# Patient Record
Sex: Male | Born: 1945 | Race: White | Hispanic: No | Smoking: Former smoker
Health system: Southern US, Community
[De-identification: ages and names within clinical notes are randomized; demographics above are authoritative.]

## PROBLEM LIST (undated history)

## (undated) DIAGNOSIS — I639 Cerebral infarction, unspecified: Secondary | ICD-10-CM

## (undated) DIAGNOSIS — R6251 Failure to thrive (child): Secondary | ICD-10-CM

## (undated) DIAGNOSIS — G35 Multiple sclerosis: Secondary | ICD-10-CM

## (undated) DIAGNOSIS — I2510 Atherosclerotic heart disease of native coronary artery without angina pectoris: Secondary | ICD-10-CM

## (undated) DIAGNOSIS — I255 Ischemic cardiomyopathy: Secondary | ICD-10-CM

## (undated) DIAGNOSIS — I10 Essential (primary) hypertension: Secondary | ICD-10-CM

## (undated) DIAGNOSIS — E785 Hyperlipidemia, unspecified: Secondary | ICD-10-CM

## (undated) DIAGNOSIS — F319 Bipolar disorder, unspecified: Secondary | ICD-10-CM

## (undated) DIAGNOSIS — I5042 Chronic combined systolic (congestive) and diastolic (congestive) heart failure: Secondary | ICD-10-CM

## (undated) HISTORY — DX: Multiple sclerosis: G35

## (undated) HISTORY — DX: Bipolar disorder, unspecified: F31.9

## (undated) HISTORY — DX: Essential (primary) hypertension: I10

## (undated) HISTORY — DX: Hyperlipidemia, unspecified: E78.5

## (undated) HISTORY — PX: PTCA: SHX146

## (undated) HISTORY — DX: Failure to thrive (child): R62.51

## (undated) HISTORY — DX: Ischemic cardiomyopathy: I25.5

## (undated) HISTORY — PX: TONSILLECTOMY: SUR1361

## (undated) HISTORY — PX: VASECTOMY: SHX75

## (undated) HISTORY — DX: Cerebral infarction, unspecified: I63.9

## (undated) HISTORY — DX: Atherosclerotic heart disease of native coronary artery without angina pectoris: I25.10

## (undated) HISTORY — PX: OTHER SURGICAL HISTORY: SHX169

---

## 1999-01-07 ENCOUNTER — Emergency Department (HOSPITAL_COMMUNITY): Admission: EM | Admit: 1999-01-07 | Discharge: 1999-01-07 | Payer: Self-pay | Admitting: Emergency Medicine

## 1999-03-27 ENCOUNTER — Encounter: Payer: Self-pay | Admitting: Family Medicine

## 1999-03-27 ENCOUNTER — Ambulatory Visit (HOSPITAL_COMMUNITY): Admission: RE | Admit: 1999-03-27 | Discharge: 1999-03-27 | Payer: Self-pay | Admitting: Family Medicine

## 1999-04-16 ENCOUNTER — Encounter: Admission: RE | Admit: 1999-04-16 | Discharge: 1999-07-12 | Payer: Self-pay | Admitting: Neurology

## 1999-06-04 ENCOUNTER — Ambulatory Visit (HOSPITAL_COMMUNITY): Admission: RE | Admit: 1999-06-04 | Discharge: 1999-06-04 | Payer: Self-pay | Admitting: Neurology

## 1999-09-17 ENCOUNTER — Ambulatory Visit (HOSPITAL_COMMUNITY): Admission: RE | Admit: 1999-09-17 | Discharge: 1999-09-18 | Payer: Self-pay | Admitting: *Deleted

## 1999-09-23 ENCOUNTER — Encounter: Payer: Self-pay | Admitting: *Deleted

## 1999-09-23 ENCOUNTER — Ambulatory Visit (HOSPITAL_COMMUNITY): Admission: RE | Admit: 1999-09-23 | Discharge: 1999-09-23 | Payer: Self-pay | Admitting: *Deleted

## 2000-01-03 ENCOUNTER — Encounter: Payer: Self-pay | Admitting: Emergency Medicine

## 2000-01-03 ENCOUNTER — Emergency Department (HOSPITAL_COMMUNITY): Admission: EM | Admit: 2000-01-03 | Discharge: 2000-01-03 | Payer: Self-pay | Admitting: Emergency Medicine

## 2004-09-27 ENCOUNTER — Ambulatory Visit: Payer: Self-pay | Admitting: *Deleted

## 2004-12-13 ENCOUNTER — Ambulatory Visit: Payer: Self-pay | Admitting: Internal Medicine

## 2005-02-04 ENCOUNTER — Ambulatory Visit: Payer: Self-pay | Admitting: Internal Medicine

## 2005-03-18 ENCOUNTER — Ambulatory Visit: Payer: Self-pay | Admitting: Internal Medicine

## 2006-05-08 ENCOUNTER — Ambulatory Visit: Payer: Self-pay | Admitting: Internal Medicine

## 2008-01-19 ENCOUNTER — Encounter: Payer: Self-pay | Admitting: Internal Medicine

## 2008-01-19 DIAGNOSIS — Z9861 Coronary angioplasty status: Secondary | ICD-10-CM

## 2008-01-19 DIAGNOSIS — I2510 Atherosclerotic heart disease of native coronary artery without angina pectoris: Secondary | ICD-10-CM

## 2008-01-19 DIAGNOSIS — G35D Multiple sclerosis, unspecified: Secondary | ICD-10-CM | POA: Insufficient documentation

## 2008-01-19 DIAGNOSIS — I10 Essential (primary) hypertension: Secondary | ICD-10-CM

## 2008-01-19 DIAGNOSIS — F319 Bipolar disorder, unspecified: Secondary | ICD-10-CM

## 2008-01-19 DIAGNOSIS — I255 Ischemic cardiomyopathy: Secondary | ICD-10-CM

## 2008-01-19 DIAGNOSIS — E785 Hyperlipidemia, unspecified: Secondary | ICD-10-CM

## 2008-01-19 DIAGNOSIS — F1021 Alcohol dependence, in remission: Secondary | ICD-10-CM

## 2008-01-19 DIAGNOSIS — G35 Multiple sclerosis: Secondary | ICD-10-CM

## 2008-08-13 ENCOUNTER — Emergency Department (HOSPITAL_COMMUNITY): Admission: EM | Admit: 2008-08-13 | Discharge: 2008-08-13 | Payer: Self-pay | Admitting: Emergency Medicine

## 2009-02-26 ENCOUNTER — Encounter: Payer: Self-pay | Admitting: Internal Medicine

## 2010-03-16 ENCOUNTER — Inpatient Hospital Stay (HOSPITAL_COMMUNITY): Admission: EM | Admit: 2010-03-16 | Discharge: 2010-03-20 | Payer: Self-pay | Admitting: Emergency Medicine

## 2010-03-18 ENCOUNTER — Ambulatory Visit: Payer: Self-pay | Admitting: Vascular Surgery

## 2010-03-18 ENCOUNTER — Encounter (INDEPENDENT_AMBULATORY_CARE_PROVIDER_SITE_OTHER): Payer: Self-pay | Admitting: Internal Medicine

## 2010-03-19 ENCOUNTER — Ambulatory Visit: Payer: Self-pay | Admitting: Physical Medicine & Rehabilitation

## 2010-03-20 ENCOUNTER — Inpatient Hospital Stay (HOSPITAL_COMMUNITY)
Admission: RE | Admit: 2010-03-20 | Discharge: 2010-04-05 | Payer: Self-pay | Admitting: Physical Medicine & Rehabilitation

## 2010-04-05 ENCOUNTER — Encounter: Payer: Self-pay | Admitting: Internal Medicine

## 2010-05-01 ENCOUNTER — Telehealth: Payer: Self-pay | Admitting: Internal Medicine

## 2010-08-29 ENCOUNTER — Inpatient Hospital Stay (HOSPITAL_COMMUNITY)
Admission: EM | Admit: 2010-08-29 | Discharge: 2010-09-09 | Disposition: A | Discharge status: Other Acute Inpatient Hospital | Payer: Self-pay | Source: Home / Self Care

## 2010-08-30 ENCOUNTER — Encounter (INDEPENDENT_AMBULATORY_CARE_PROVIDER_SITE_OTHER): Payer: Self-pay

## 2010-09-29 ENCOUNTER — Encounter: Payer: Self-pay | Admitting: Physical Medicine & Rehabilitation

## 2010-10-10 NOTE — Miscellaneous (Signed)
Summary: Discharge/[place]  Discharge/[place]   Imported By: Lester Celada 04/23/2010 08:25:22  _____________________________________________________________________  External Attachment:    Type:   Image     Comment:   External Document

## 2010-10-10 NOTE — Progress Notes (Signed)
       New/Updated Medications: ACETAMINOPHEN 325 MG SUPP (ACETAMINOPHEN) 1 to 2 by mouth q 4 hours as needed     Current Medications (verified): 1)  Lithium Carbonate 300 Mg  Caps (Lithium Carbonate) .... Take 1 Q Am, 2 Q Pm 2)  Aspirin Ec 325 Mg  Tbec (Aspirin) .... Take 1 By Mouth Qd 3)  Lipitor 80 Mg  Tabs (Atorvastatin Calcium) .... Take 1 By Mouth Qd 4)  Spironolactone 25 Mg  Tabs (Spironolactone) .... Take 1 By Mouth Qd 5)  Norvasc 10 Mg  Tabs (Amlodipine Besylate) .... Take 2 By Mouth Qd 6)  Furosemide 20 Mg  Tabs (Furosemide) .... Take 1 By Mouth Qd 7)  Benazepril Hcl 40 Mg  Tabs (Benazepril Hcl) .... Take 1/2 By Mouth Two Times A Day 8)  Plavix 75 Mg  Tabs (Clopidogrel Bisulfate) .... Take 1 Po Qd 9)  Fluoxetine Hcl 20 Mg  Tabs (Fluoxetine Hcl) .... Take 1 Po Qd 10)  Coreg Cr 80 Mg  Cp24 (Carvedilol Phosphate) .... Take 1 By Mouth Qd 11)  Alprazolam 1 Mg  Tabs (Alprazolam) .... Use Prn 12)  Nitroquick 0.4 Mg  Subl (Nitroglycerin) .... Use Prn 13)  Acetaminophen 325 Mg Supp (Acetaminophen) .Marland Kitchen.. 1 To 2 By Mouth Q 4 Hours As Needed  Allergies: 1)  ! Demerol   Comments Per pt's medicatin reconziliation paper from [facility] hosp. Pt is no longer taking spironolactone, norvasc, fluoxetine, alprazolam

## 2010-11-18 LAB — CBC
HCT: 35.6 % — ABNORMAL LOW (ref 39.0–52.0)
HCT: 42.8 % (ref 39.0–52.0)
HCT: 46.8 % (ref 39.0–52.0)
Hemoglobin: 11.8 g/dL — ABNORMAL LOW (ref 13.0–17.0)
Hemoglobin: 13.1 g/dL (ref 13.0–17.0)
Hemoglobin: 14.4 g/dL (ref 13.0–17.0)
Hemoglobin: 15.8 g/dL (ref 13.0–17.0)
MCH: 32.3 pg (ref 26.0–34.0)
MCH: 32.5 pg (ref 26.0–34.0)
MCHC: 32.4 g/dL (ref 30.0–36.0)
MCHC: 33.6 g/dL (ref 30.0–36.0)
MCV: 100 fL (ref 78.0–100.0)
MCV: 100.3 fL — ABNORMAL HIGH (ref 78.0–100.0)
MCV: 100.5 fL — ABNORMAL HIGH (ref 78.0–100.0)
MCV: 99.8 fL (ref 78.0–100.0)
Platelets: 162 10*3/uL (ref 150–400)
Platelets: 209 10*3/uL (ref 150–400)
RBC: 3.63 MIL/uL — ABNORMAL LOW (ref 4.22–5.81)
RBC: 3.99 MIL/uL — ABNORMAL LOW (ref 4.22–5.81)
RBC: 4.69 MIL/uL (ref 4.22–5.81)
RDW: 12.6 % (ref 11.5–15.5)
RDW: 12.6 % (ref 11.5–15.5)
WBC: 10.9 10*3/uL — ABNORMAL HIGH (ref 4.0–10.5)

## 2010-11-18 LAB — BASIC METABOLIC PANEL
BUN: 13 mg/dL (ref 6–23)
CO2: 24 mEq/L (ref 19–32)
CO2: 27 mEq/L (ref 19–32)
CO2: 28 mEq/L (ref 19–32)
Calcium: 8.9 mg/dL (ref 8.4–10.5)
Calcium: 8.9 mg/dL (ref 8.4–10.5)
Chloride: 105 mEq/L (ref 96–112)
Chloride: 107 mEq/L (ref 96–112)
Chloride: 107 mEq/L (ref 96–112)
Creatinine, Ser: 0.85 mg/dL (ref 0.4–1.5)
Creatinine, Ser: 1.03 mg/dL (ref 0.4–1.5)
GFR calc Af Amer: >60 mL/min (ref >60)
Glucose, Bld: 93 mg/dL (ref 70–99)
Glucose, Bld: 97 mg/dL (ref 70–99)
Potassium: 3.7 mEq/L (ref 3.5–5.1)
Potassium: 4.6 mEq/L (ref 3.5–5.1)

## 2010-11-18 LAB — POCT I-STAT, CHEM 8
BUN: 19 mg/dL (ref 6–23)
Creatinine, Ser: 0.9 mg/dL (ref 0.4–1.5)
Glucose, Bld: 135 mg/dL — ABNORMAL HIGH (ref 70–99)

## 2010-11-18 LAB — URINALYSIS, ROUTINE W REFLEX MICROSCOPIC
Glucose, UA: NEGATIVE mg/dL
Hgb urine dipstick: NEGATIVE
Nitrite: NEGATIVE
Urobilinogen, UA: 1 mg/dL (ref 0.0–1.0)

## 2010-11-18 LAB — DIFFERENTIAL
Basophils Absolute: 0 10*3/uL (ref 0.0–0.1)
Basophils Relative: 0 % (ref 0–1)
Eosinophils Absolute: 0 10*3/uL (ref 0.0–0.7)
Monocytes Relative: 5 % (ref 3–12)
Neutrophils Relative %: 87 % — ABNORMAL HIGH (ref 43–77)

## 2010-11-18 LAB — POCT CARDIAC MARKERS
Myoglobin, poc: 51.6 ng/mL (ref 12–200)
Troponin i, poc: <0.05 ng/mL (ref 0.00–0.09)

## 2010-11-18 LAB — HEPATIC FUNCTION PANEL
Albumin: 3.7 g/dL (ref 3.5–5.2)
Bilirubin, Direct: 0.3 mg/dL (ref 0.0–0.3)
Total Protein: 7.1 g/dL (ref 6.0–8.3)

## 2010-11-18 LAB — PLATELET INHIBITION P2Y12
P2Y12 % Inhibition: 0 %
Platelet Function  P2Y12: 222 [PRU] (ref 194–418)

## 2010-11-18 LAB — LIPASE, BLOOD: Lipase: 31 U/L (ref 11–59)

## 2010-11-23 LAB — BASIC METABOLIC PANEL
BUN: 11 mg/dL (ref 6–23)
BUN: 18 mg/dL (ref 6–23)
CO2: 28 mEq/L (ref 19–32)
CO2: 30 mEq/L (ref 19–32)
Calcium: 8.7 mg/dL (ref 8.4–10.5)
Calcium: 8.7 mg/dL (ref 8.4–10.5)
Chloride: 107 mEq/L (ref 96–112)
Chloride: 98 mEq/L (ref 96–112)
Creatinine, Ser: 0.9 mg/dL (ref 0.4–1.5)
Creatinine, Ser: 1.15 mg/dL (ref 0.4–1.5)
GFR calc Af Amer: >60 mL/min (ref >60)
Glucose, Bld: 96 mg/dL (ref 70–99)

## 2010-11-24 LAB — URINALYSIS, ROUTINE W REFLEX MICROSCOPIC
Glucose, UA: NEGATIVE mg/dL
Hgb urine dipstick: NEGATIVE
Ketones, ur: 15 mg/dL — AB
Protein, ur: NEGATIVE mg/dL
pH: 6 (ref 5.0–8.0)

## 2010-11-24 LAB — POCT I-STAT, CHEM 8
BUN: 16 mg/dL (ref 6–23)
Chloride: 110 mEq/L (ref 96–112)
Creatinine, Ser: 1.1 mg/dL (ref 0.4–1.5)
Glucose, Bld: 101 mg/dL — ABNORMAL HIGH (ref 70–99)
Potassium: 4.1 mEq/L (ref 3.5–5.1)
Sodium: 140 mEq/L (ref 135–145)

## 2010-11-24 LAB — COMPREHENSIVE METABOLIC PANEL
Albumin: 3.3 g/dL — ABNORMAL LOW (ref 3.5–5.2)
Albumin: 3.7 g/dL (ref 3.5–5.2)
BUN: 12 mg/dL (ref 6–23)
BUN: 13 mg/dL (ref 6–23)
Calcium: 9 mg/dL (ref 8.4–10.5)
Chloride: 108 mEq/L (ref 96–112)
Creatinine, Ser: 0.9 mg/dL (ref 0.4–1.5)
Creatinine, Ser: 1.12 mg/dL (ref 0.4–1.5)
GFR calc non Af Amer: >60 mL/min (ref >60)
Total Bilirubin: 0.5 mg/dL (ref 0.3–1.2)
Total Protein: 6.5 g/dL (ref 6.0–8.3)

## 2010-11-24 LAB — LIPID PANEL
HDL: 60 mg/dL (ref >39)
Triglycerides: 93 mg/dL (ref <150)

## 2010-11-24 LAB — DIFFERENTIAL
Basophils Relative: 0 % (ref 0–1)
Eosinophils Absolute: 0.1 10*3/uL (ref 0.0–0.7)
Eosinophils Relative: 2 % (ref 0–5)
Lymphocytes Relative: 23 % (ref 12–46)
Lymphocytes Relative: 35 % (ref 12–46)
Lymphs Abs: 1.5 10*3/uL (ref 0.7–4.0)
Monocytes Absolute: 0.6 10*3/uL (ref 0.1–1.0)
Monocytes Relative: 8 % (ref 3–12)
Monocytes Relative: 9 % (ref 3–12)
Neutro Abs: 3.3 10*3/uL (ref 1.7–7.7)
Neutro Abs: 4.3 10*3/uL (ref 1.7–7.7)

## 2010-11-24 LAB — CK TOTAL AND CKMB (NOT AT ARMC)
CK, MB: 1.3 ng/mL (ref 0.3–4.0)
Relative Index: INVALID (ref 0.0–2.5)
Relative Index: INVALID (ref 0.0–2.5)
Total CK: 37 U/L (ref 7–232)

## 2010-11-24 LAB — CBC
MCH: 33.6 pg (ref 26.0–34.0)
MCV: 100.5 fL — ABNORMAL HIGH (ref 78.0–100.0)
MCV: 100.9 fL — ABNORMAL HIGH (ref 78.0–100.0)
Platelets: 102 10*3/uL — ABNORMAL LOW (ref 150–400)
Platelets: 136 10*3/uL — ABNORMAL LOW (ref 150–400)
RDW: 13.1 % (ref 11.5–15.5)
RDW: 13.3 % (ref 11.5–15.5)
WBC: 6.2 10*3/uL (ref 4.0–10.5)
WBC: 6.4 10*3/uL (ref 4.0–10.5)

## 2010-11-24 LAB — ETHANOL: Alcohol, Ethyl (B): <5 mg/dL (ref 0–10)

## 2010-11-24 LAB — URINE CULTURE
Colony Count: NO GROWTH
Culture: NO GROWTH

## 2010-11-24 LAB — POCT CARDIAC MARKERS
CKMB, poc: <1 ng/mL — ABNORMAL LOW (ref 1.0–8.0)
Myoglobin, poc: 94.6 ng/mL (ref 12–200)
Troponin i, poc: <0.05 ng/mL (ref 0.00–0.09)

## 2010-11-24 LAB — PROTIME-INR: INR: 1.06 (ref 0.00–1.49)

## 2010-11-24 LAB — GLUCOSE, CAPILLARY: Glucose-Capillary: 97 mg/dL (ref 70–99)

## 2011-01-24 NOTE — Procedures (Signed)
[place]. [facility]  Patient:    [name]                         MRN: [id number] Proc. Date: 09/17/99 Adm. Date:  [id number] Attending:  Meade Maw A CC:         Meade Maw, M.D.             [name], M.D.                           Procedure Report  CINE:  #[id number]  PROCEDURE PERFORMED:  Percutaneous transluminal coronary angioplasty of the obtuse marginal-I.  CARDIOLOGIST:  Celso Sickle, M.D.  INDICATIONS:  Significant coronary artery disease involving the first obtuse marginal.  DESCRIPTION OF PROCEDURE:  After diagnostic cardiac catheterization was performed by Dr. Hillary Bow, the patient was advised to undergo a percutaneous transluminal coronary angioplasty of the first obtuse marginal branch.  After speaking with the patient and with his wife and explaining the risks, we proceeded with this procedure.  We upgraded to a 7-French sheath and used a 7-French #4 left Judkins catheter, a Duostat hemostatic connector, and two BMW wires, one to enter into the obtuse marginal, and the other to protect the native circumflex, as the lesion was in a branch point.  We gave a bolus and infusion f ReoPro and documented the ACT to be 238, after 3500 units of IV heparin.  We then performed an angioplasty using a 3.0 mm x 15.0 long CrossSail balloon.  Three balloon inflations were performed.  Peak pressure was 8 atmospheres.  The patient tolerated the procedure without complications.  The ACT post-procedure was 256 seconds.  CONCLUSIONS:  Successful percutaneous transluminal coronary angioplasty of the first obtuse marginal with a reduction in the stenosis from 95% to less than 35%, with brisk antegrade flow.  PLAN:  Continue ReoPro x 12 hours.  IV heparin will not be resumed.  IV nitroglycerin will be used to control the blood pressure and to prevent spasm. Hopeful discharge on September 18, 1999. DD:  09/17/99 TD:   09/17/99 Job: [id number]

## 2011-01-24 NOTE — Discharge Summary (Signed)
[place]. [facility]  Patient:    [name], [name].                       MRN: [id number] Adm. Date:  09/17/99 Disc. Date: 09/18/99 Attending:  Meade Maw, M.D. Dictator:   Anselm Lis, N.P. CC:         Celso Sickle, M.D.             [name], M.D.                           Discharge Summary  23-HOUR OBSERVATION NOTE  DATE OF BIRTH:  July 09, 1944  CARDIOLOGY CONSULTANT/INTERVENTIONIST:  Dr. Verdis Prime  PRIMARY CARE M.D.:  Dr. Catha Gosselin  DISCHARGE DIAGNOSES: 1. Coronary atherosclerotic heart disease; percutaneous transluminal coronary    angioplasty ostial obtuse marginal 1.  Has residual noncritical disease distal    circumflex. 2. Tobacco use; ongoing, but is decreasing use with Wellbutrin. 3. History of depression; on Effexor and Wellbutrin. 4. History of multiple sclerosis. 5. Preserved left ventricular function by LV-gram during catheterization with    ejection fraction of 60%, without regional wall motion abnormality.  This is    compared to a documented ejection fraction of 30% by transesophageal    echocardiogram September 2000.  No mitral regurgitation. 6. Abnormal chest x-ray, in setting of heavy tobacco use.  PLAN: 1. Patient is discharged home in stable and improved condition. 2. Secondary prevention initiated with Zocor; he will need a fasting Statin    profile in six weeks either by our clinic or with primary care.  Continued    smoking cessation counseling. 3. Follow-up chest CT scan scheduled at [facility] on Monday, September 23, 1999 (first floor, outpatient education center).  The patient will report to  admissions department, register there, and then be directed to radiology    department for CT scan.  He has a follow-up appointment with Dr. Hillary Bow September 27, 1999, at 9:45 a.m. 4. Medications:    a. (New) Nitroglycerin tablets 0.4 mg sublingual p.r.n. chest pain, may repeat     each five minutes up to 3 tablets.  Written and verbal instructions for use       of this medication provided.    b. Effexor 150 mg p.o. q.d.    c. Accupril 20 mg p.o. q.d.    d. Wellbutrin 150 p.o. q.d.    e. (New) Enteric-coated aspirin 325 mg once daily.    f. (New) Zocor 20 mg p.o. q.d. 5. Activity:  No heavy lifting or pushing on the day of discharge or driving,    then may resume activity as before.  Work release note was provided to have    excuse for patient from work during hospital stay. 6. Diet:  Low fat, low cholesterol, no added salt.  Written instructions provided    for low fat diet. 7. Wound care:  May shower. 8. Special instructions:  Patient to call our clinic if he develops large amount of    swelling or bruising in the groin area.  LABORATORY DATA:  On September 18, 1999, sodium 140, potassium 5, chloride 107, CO2 29, BUN 12, creatinine 1.0, glucose 92, calcium okay at 8.8.  CBC from January , 2001, reveals hemoglobin of 13.3, with WBC of 8.7 and platelets of 217.  Chest x-ray:  I do not have the results in front of me right now, but at the time it was reviewed, I believe, it mentioned perihilar prominence with suggestion for follow-up CT scan.  EKG revealed NSR at 87 beats per minute; T-wave inversion aVL.  No acute abnormality. DD:  09/18/99 TD:  09/18/99 Job: [id number]

## 2011-01-24 NOTE — Cardiovascular Report (Signed)
[place]. [facility]  Patient:    [name]                         MRN: [id number] Proc. Date: 09/17/99 Adm. Date:  [id number] Attending:  Meade Maw A CC:         Cardiac Catheterization Laboratory             Caryn Bee L. Little, M.D.                        Cardiac Catheterization  PROCEDURE PERFORMED:  Left heart catheterization, coronary angiography, single plane ventriculogram.  CARDIOLOGIST:  Meade Maw, M.D.  DESCRIPTION OF PROCEDURE:  After obtaining a written informed consent, the patient was brought to the cardiac catheterization laboratory in the postabsorptive state. Preoperative sedation was achieved using IV Versed and IV Sublimaze.  The right  femoral head was identified using radiographic technique.  Local anesthesia was  achieved using 1% Xylocaine.  A 6-French hemostasis sheath was placed into the right femoral artery using a modified Seldinger technique.  Selective coronary angiography was performed using a JL4 and JR4 Judkins catheter.  Nonionic contrast was used and was hand injected.  All catheter exchanges were made over a guide wire.  A single plane ventriculogram was performed in the RAO position using a 6-French pigtail curved catheter.  Nonionic contrast was used and was power injected.  Following a review of the films, it was felt that intervention on the obtuse marginal was indicated.  Dr. Darci Needle III was consulted, and proceeded with intervention on the first obtuse marginal.  FINDINGS: SINGLE PLANE VENTRICULOGRAM:  Revealed normal wall motion with an ejection fraction of 60%.  The aortic pressure was 160/92.  LV pressure 163/15, with an EDP of 15.  RESULTS: 1. Left main coronary artery:  Bifurcated into the left anterior descending    coronary artery and circumflex vessel.  There was no significant disease    in the left main coronary artery. 2. Left anterior descending coronary artery:  The left  anterior descending    coronary artery gave rise to a small D-I, a moderate D-II, small D-III,    and ended as an apical recurrent branch.  There was no significant disease    in the left anterior descending or its branches. 3. Circumflex vessel:  The circumflex vessel gave rise to a large OM-I, and    a large OM-II, and ended as a true circumflex vessel.  There was an    80%-90% ostial lesion involving the first obtuse marginal and the bifurcation    of the second obtuse marginal. 4. Right coronary artery:  The right coronary artery was dominant for the    posterior circulation and gave rise to three small to moderate artery    marginals and a large PDA.  There were luminal irregularities up to 30% in    the right coronary artery.  IMPRESSION: 1. Critical disease in the first obtuse marginal which involves the bifurcation  of the second obtuse marginal. 2. Noncritical disease in the distal circumflex. 3. Luminal irregularities in the right coronary artery. 4. Preserved left ventricular function.  RECOMMENDATIONS:  Dr. Katrinka Blazing was consulted and will proceed with an angioplasty n the right coronary artery. DD:  09/17/99 TD:  09/17/99 Job: [id number]

## 2011-02-19 ENCOUNTER — Encounter: Payer: Self-pay | Admitting: Internal Medicine

## 2011-02-20 ENCOUNTER — Ambulatory Visit: Payer: Self-pay | Admitting: Internal Medicine

## 2011-04-23 ENCOUNTER — Ambulatory Visit (INDEPENDENT_AMBULATORY_CARE_PROVIDER_SITE_OTHER): Payer: Medicare Other | Admitting: Internal Medicine

## 2011-04-23 ENCOUNTER — Other Ambulatory Visit (INDEPENDENT_AMBULATORY_CARE_PROVIDER_SITE_OTHER): Payer: Medicare Other

## 2011-04-23 ENCOUNTER — Encounter: Payer: Self-pay | Admitting: Internal Medicine

## 2011-04-23 DIAGNOSIS — I10 Essential (primary) hypertension: Secondary | ICD-10-CM

## 2011-04-23 DIAGNOSIS — E785 Hyperlipidemia, unspecified: Secondary | ICD-10-CM

## 2011-04-23 DIAGNOSIS — I2510 Atherosclerotic heart disease of native coronary artery without angina pectoris: Secondary | ICD-10-CM

## 2011-04-23 DIAGNOSIS — F1021 Alcohol dependence, in remission: Secondary | ICD-10-CM

## 2011-04-23 DIAGNOSIS — I428 Other cardiomyopathies: Secondary | ICD-10-CM

## 2011-04-23 DIAGNOSIS — F319 Bipolar disorder, unspecified: Secondary | ICD-10-CM

## 2011-04-23 LAB — HEPATIC FUNCTION PANEL
AST: 17 U/L (ref 0–37)
Albumin: 4 g/dL (ref 3.5–5.2)
Alkaline Phosphatase: 78 U/L (ref 39–117)
Bilirubin, Direct: 0.1 mg/dL (ref 0.0–0.3)

## 2011-04-23 LAB — LIPID PANEL
HDL: 76.2 mg/dL (ref >39.00)
LDL Cholesterol: 56 mg/dL (ref 0–99)
Total CHOL/HDL Ratio: 2
Triglycerides: 59 mg/dL (ref 0.0–149.0)

## 2011-04-23 LAB — COMPREHENSIVE METABOLIC PANEL
ALT: 10 U/L (ref 0–53)
AST: 17 U/L (ref 0–37)
CO2: 29 mEq/L (ref 19–32)
Creatinine, Ser: 1.1 mg/dL (ref 0.4–1.5)
GFR: 75.63 mL/min (ref >60.00)
Total Bilirubin: 0.6 mg/dL (ref 0.3–1.2)

## 2011-04-23 NOTE — Progress Notes (Signed)
  Subjective:    Patient ID: [name], male    DOB: Dec 10, 1945, 65 y.o.   MRN: [id number]  HPI [name] has not been seen for some time. He did have appendicitis in Dec '11 with laproscopic surgery. He did well. IN the interval he has been doing well: no chest pain, GI problems, pulmonary problems. Today he would like to change his care for cardiology from [name] in [facility] to [name]. He still sees Psych at [facility], [name] (was [facility]). He is taking all his meds and has been doing well.  Past Medical History  Diagnosis Date  . Coronary artery disease   . Hyperlipidemia   . Hypertension   . Cerebrovascular accident     history of cerebrovascular accident with residual left hemiparesis in 2002, seen at [facility]   Past Surgical History  Procedure Date  . Tonsillectomy   . Vasectomy   . Implant of automatic cardioverter defibrillator     December 26, 2004   Family History  Problem Relation Age of Onset  . Heart disease Mother   . Kidney disease Father   . COPD Sister   . Cancer Neg Hx   . Diabetes Neg Hx    History   Social History  . Marital Status: Divorced    Spouse Name: N/A    Number of Children: N/A  . Years of Education: N/A   Occupational History  . Not on file.   Social History Main Topics  . Smoking status: Current Everyday Smoker -- 0.5 packs/day for 40 years    Types: Cigarettes  . Smokeless tobacco: Never Used  . Alcohol Use: No  . Drug Use: No  . Sexually Active: Not on file   Other Topics Concern  . Not on file   Social History Narrative   HSG. [employer] - 09 months. Married '70's - 54yrs/divorce. Married '80's - 10 yrs/divorced. 1 son - '71, 1 drtr '73. Has grandchildren. Lives alone. On disability.       Review of Systems Review of Systems  Constitutional:  Negative for fever, chills, activity change and weight change 20 lbs after appendicitis  HEENT:  Negative for hearing loss, ear pain,  congestion, neck stiffness and postnasal drip. Negative for sore throat or swallowing problems. Negative for dental complaints- lower denture.   Eyes: Negative for vision loss or change in visual acuity.  Respiratory: Negative for chest tightness and wheezing.   Cardiovascular: Negative for chest pain and palpitation. No decreased exercise tolerance Gastrointestinal: No change in bowel habit. No bloating or gas. No reflux or indigestion Genitourinary: Negative for urgency, frequency, flank pain and difficulty urinating.  Musculoskeletal: Negative for myalgias, back pain, arthralgias and gait problem.  Neurological: Negative for dizziness, tremors, weakness and headaches.  Hematological: Negative for adenopathy.  Psychiatric/Behavioral: Negative for behavioral problems and dysphoric mood.       Objective:   Physical Exam Vitals notwesd - bradycardic Gen'l - older white male, thin, no distress HEENT-[place]/AT,  Cor - regular with pauses, no murmur, no peripheral edema Pul - normal respirations Neuro - A&O x 3.        Assessment & Plan:

## 2011-04-24 ENCOUNTER — Encounter: Payer: Self-pay | Admitting: Internal Medicine

## 2011-04-24 NOTE — Assessment & Plan Note (Signed)
BP Readings from Last 3 Encounters:  04/23/11 118/80  05/08/06 133/69   Seems to have good control on his present medications. Will continue the same.

## 2011-04-24 NOTE — Assessment & Plan Note (Signed)
Patient follow at the [facility] - on valproic acid and SRRI. He has asked if he can get his prescriptions through this office. He is encouraged to continue to be followed at [facility] [place] [facility])

## 2011-04-24 NOTE — Assessment & Plan Note (Signed)
Stable with no report of chest pain. No cath data in EMR  Plan - refer to [facility]

## 2011-04-24 NOTE — Assessment & Plan Note (Signed)
No data in EMR. He does hav a device implanted.  Plan - refer to [facility]

## 2011-04-24 NOTE — Assessment & Plan Note (Signed)
Labs befroe visit with LDL 56, HDL 76 on his present regimen of lipitor 40.  Plan - continue current medication.

## 2011-04-24 NOTE — Assessment & Plan Note (Signed)
In recovery for more than 20 years. Does not attend AA

## 2011-04-28 ENCOUNTER — Encounter: Payer: Self-pay | Admitting: Internal Medicine

## 2011-05-26 ENCOUNTER — Ambulatory Visit: Payer: Medicare Other | Admitting: Internal Medicine

## 2011-06-13 LAB — POCT I-STAT, CHEM 8
Calcium, Ion: 1.12 mmol/L (ref 1.12–1.32)
Chloride: 106 mEq/L (ref 96–112)
Creatinine, Ser: 1.3 mg/dL (ref 0.4–1.5)
Glucose, Bld: 98 mg/dL (ref 70–99)
HCT: 49 % (ref 39.0–52.0)

## 2011-08-04 ENCOUNTER — Encounter: Payer: Self-pay | Admitting: Internal Medicine

## 2011-08-04 ENCOUNTER — Ambulatory Visit (INDEPENDENT_AMBULATORY_CARE_PROVIDER_SITE_OTHER): Payer: Medicare Other | Admitting: Internal Medicine

## 2011-08-04 VITALS — BP 128/81 | HR 63 | Resp 18 | Ht 66.0 in | Wt 141.0 lb

## 2011-08-04 DIAGNOSIS — I519 Heart disease, unspecified: Secondary | ICD-10-CM

## 2011-08-04 DIAGNOSIS — I5042 Chronic combined systolic (congestive) and diastolic (congestive) heart failure: Secondary | ICD-10-CM | POA: Insufficient documentation

## 2011-08-04 DIAGNOSIS — I2510 Atherosclerotic heart disease of native coronary artery without angina pectoris: Secondary | ICD-10-CM

## 2011-08-04 DIAGNOSIS — F17200 Nicotine dependence, unspecified, uncomplicated: Secondary | ICD-10-CM

## 2011-08-04 DIAGNOSIS — Z720 Tobacco use: Secondary | ICD-10-CM | POA: Insufficient documentation

## 2011-08-04 DIAGNOSIS — I428 Other cardiomyopathies: Secondary | ICD-10-CM

## 2011-08-04 DIAGNOSIS — I10 Essential (primary) hypertension: Secondary | ICD-10-CM

## 2011-08-04 LAB — ICD DEVICE OBSERVATION
ATRIAL PACING ICD: 33.48 pct
BAMS-0001: 170 {beats}/min
CHARGE TIME: 8.908 s
FVT: 0
LV LEAD THRESHOLD: 1.25 V
PACEART VT: 0
RV LEAD IMPEDENCE ICD: 646 Ohm
RV LEAD THRESHOLD: 0.5 V
TOT-0001: 0
TZAT-0002ATACH: NEGATIVE
TZAT-0002ATACH: NEGATIVE
TZAT-0005SLOWVT: 84 pct
TZAT-0005SLOWVT: 91 pct
TZAT-0011SLOWVT: 10 ms
TZAT-0011SLOWVT: 10 ms
TZAT-0012ATACH: 150 ms
TZAT-0012ATACH: 150 ms
TZAT-0012SLOWVT: 200 ms
TZAT-0012SLOWVT: 200 ms
TZAT-0019ATACH: 6 V
TZAT-0019ATACH: 6 V
TZAT-0019ATACH: 6 V
TZAT-0019FASTVT: 8 V
TZAT-0019SLOWVT: 8 V
TZAT-0019SLOWVT: 8 V
TZAT-0020ATACH: 1.5 ms
TZAT-0020FASTVT: 1.5 ms
TZON-0003ATACH: 350 ms
TZON-0003SLOWVT: 400 ms
TZON-0005SLOWVT: 12
TZON-0010FASTVT: 40 ms
TZST-0001ATACH: 4
TZST-0001ATACH: 5
TZST-0001FASTVT: 2
TZST-0001FASTVT: 3
TZST-0001SLOWVT: 4
TZST-0001SLOWVT: 6
TZST-0002FASTVT: NEGATIVE
TZST-0002FASTVT: NEGATIVE
TZST-0002FASTVT: NEGATIVE
TZST-0003SLOWVT: 25 J
TZST-0003SLOWVT: 35 J
TZST-0003SLOWVT: 35 J
VENTRICULAR PACING ICD: 98.01 pct

## 2011-08-04 NOTE — Assessment & Plan Note (Signed)
Mild volume overload on exam, optivol is elevated He reports noncompliance with lasix recently.  I have stressed the importance with daily lasix. 2 gram sodium diet also encouraged.   Normal BIV ICD function He has a spint fidelis lead which is functioning normally See Arita Miss Art report Tachy detections changed to a single VF zone at 200 bpm with 30/40 NID to prevent inappropriate ICD shocks

## 2011-08-04 NOTE — Assessment & Plan Note (Signed)
Stable No change required today  

## 2011-08-04 NOTE — Assessment & Plan Note (Signed)
No ischemic symptoms Continue medical therapy  Echo to evaluate EF

## 2011-08-04 NOTE — Assessment & Plan Note (Signed)
Cessation advised He states that he will continue to try to quit

## 2011-08-04 NOTE — Patient Instructions (Signed)
Your physician recommends that you schedule a follow-up appointment in: 3 months  Take your fluid pill  Your physician has requested that you have an echocardiogram. Echocardiography is a painless test that uses sound waves to create images of your heart. It provides your doctor with information about the size and shape of your heart and how well your heart's chambers and valves are working. This procedure takes approximately one hour. There are no restrictions for this procedure.--prior to appointment   2 Gram Low Sodium Diet A 2 gram sodium diet restricts the amount of sodium in the diet to no more than 2 g or 2000 mg daily. Limiting the amount of sodium is often used to help lower blood pressure. It is important if you have heart, liver, or kidney problems. Many foods contain sodium for flavor and sometimes as a preservative. When the amount of sodium in a diet needs to be low, it is important to know what to look for when choosing foods and drinks. The following includes some information and guidelines to help make it easier for you to adapt to a low sodium diet. QUICK TIPS  Do not add salt to food.   Avoid convenience items and fast food.   Choose unsalted snack foods.   Buy lower sodium products, often labeled as "lower sodium" or "no salt added."   Check food labels to learn how much sodium is in 1 serving.   When eating at a restaurant, ask that your food be prepared with less salt or none, if possible.  READING FOOD LABELS FOR SODIUM INFORMATION The nutrition facts label is a good place to find how much sodium is in foods. Look for products with no more than 500 to 600 mg of sodium per meal and no more than 150 mg per serving. Remember that 2 g = 2000 mg. The food label may also list foods as:  Sodium-free: Less than 5 mg in a serving.   Very low sodium: 35 mg or less in a serving.   Low-sodium: 140 mg or less in a serving.   Light in sodium: 50% less sodium in a serving.  For example, if a food that usually has 300 mg of sodium is changed to become light in sodium, it will have 150 mg of sodium.   Reduced sodium: 25% less sodium in a serving. For example, if a food that usually has 400 mg of sodium is changed to reduced sodium, it will have 300 mg of sodium.  CHOOSING FOODS Grains  Avoid: Salted crackers and snack items. Some cereals, including instant hot cereals. Bread stuffing and biscuit mixes. Seasoned rice or pasta mixes.   Choose: Unsalted snack items. Low-sodium cereals, oats, puffed wheat and rice, shredded wheat. English muffins and bread. Pasta.  Meats  Avoid: Salted, canned, smoked, spiced, pickled meats, including fish and poultry. Bacon, ham, sausage, cold cuts, hot dogs, anchovies.   Choose: Low-sodium canned tuna and salmon. Fresh or frozen meat, poultry, and fish.  Dairy  Avoid: Processed cheese and spreads. Cottage cheese. Buttermilk and condensed milk. Regular cheese.   Choose: Milk. Low-sodium cottage cheese. Yogurt. Sour cream. Low-sodium cheese.  Fruits and Vegetables  Avoid: Regular canned vegetables. Regular canned tomato sauce and paste. Frozen vegetables in sauces. Olives. Rosita Fire. Relishes. Sauerkraut.   Choose: Low-sodium canned vegetables. Low-sodium tomato sauce and paste. Frozen or fresh vegetables. Fresh and frozen fruit.  Condiments  Avoid: Canned and packaged gravies. Worcestershire sauce. Tartar sauce. Barbecue sauce. Soy sauce. Steak  sauce. Ketchup. Onion, garlic, and table salt. Meat flavorings and tenderizers.   Choose: Fresh and dried herbs and spices. Low-sodium varieties of mustard and ketchup. Lemon juice. Tabasco sauce. Horseradish.  SAMPLE 2 GRAM SODIUM MEAL PLAN Breakfast / Sodium (mg)  1 cup low-fat milk / 143 mg   2 slices whole-wheat toast / 270 mg   1 tbs heart-healthy margarine / 153 mg   1 hard-boiled egg / 139 mg   1 small orange / 0 mg  Lunch / Sodium (mg)  1 cup raw carrots / 76 mg     cup hummus / 298 mg   1 cup low-fat milk / 143 mg    cup red grapes / 2 mg   1 whole-wheat pita bread / 356 mg  Dinner / Sodium (mg)  1 cup whole-wheat pasta / 2 mg   1 cup low-sodium tomato sauce / 73 mg   3 oz lean ground beef / 57 mg   1 small side salad (1 cup raw spinach leaves,  cup cucumber,  cup yellow bell pepper) with 1 tsp olive oil and 1 tsp red wine vinegar / 25 mg  Snack / Sodium (mg)  1 container low-fat vanilla yogurt / 107 mg   3 graham cracker squares / 127 mg  Nutrient Analysis  Calories: 2033   Protein: 77 g   Carbohydrate: 282 g   Fat: 72 g   Sodium: 1971 mg  Document Released: 08/25/2005 Document Revised: 05/07/2011 Document Reviewed: 11/26/2009 [facility] Patient Information 2012 [place], [place].

## 2011-08-04 NOTE — Progress Notes (Signed)
PCP: Dr Debby Bud Primary Cardiologist: Previously Dr Serena Croissant is a 65 y.o. male with a h/o ischemic CM and chronic systolic dysfunction sp BiV ICD implantation (MDT) by Dr Chales Abrahams  who presents today to establish care in the Electrophysiology device clinic.  He is s/p PCI of the LAD 2007 at [facility].  He also has chronic systolic dysfunction and underwent BiV ICD implantation 2006.  The patient reports doing very well since having a BiV ICD implanted and remains very active despite his age.  He has bipolar disorder which is stable.  He reports good response to resynchronization therapy.  He has never received an ICD shock.   He is primarily limited by his Multiple Sclerosis.     Today, he  denies symptoms of palpitations, chest pain, shortness of breath, orthopnea, PND, lower extremity edema, dizziness, presyncope, or syncope.  The patientis tolerating medications without difficulties and is otherwise without complaint today.   Past Medical History  Diagnosis Date  . Coronary artery disease     s/p PCI of LAD with BMS 04/03/06 at [facility]  . Hyperlipidemia   . Hypertension   . Cerebrovascular accident     history of cerebrovascular accident with residual left hemiparesis in 2002, seen at [facility]  . Multiple sclerosis   . Ischemic cardiomyopathy     s/p BiV ICD implanted 2007 at [facility]  . Chronic systolic congestive heart failure   . Bipolar disorder    Past Surgical History  Procedure Date  . Tonsillectomy   . Vasectomy   . Implant of automatic cardioverter defibrillator     December 26, 2004 MDT BiV ICD implanted by Dr Chales Abrahams in [facility]  . Ptca     PCI LAD with BMS 04/03/06    History   Social History  . Marital Status: Divorced    Spouse Name: N/A    Number of Children: N/A  . Years of Education: N/A   Occupational History  . Not on file.   Social History Main Topics  . Smoking status: Current Everyday Smoker -- 0.5  packs/day for 40 years    Types: Cigarettes  . Smokeless tobacco: Never Used   Comment: has tried to quit "many times" will continue to try to quit  . Alcohol Use: Yes     remote heavy ETOH, "years ago"  . Drug Use: No  . Sexually Active: Not on file   Other Topics Concern  . Not on file   Social History Narrative   HSG. [employer] - 09 months. Married '70's - 67yrs/divorce. Married '80's - 10 yrs/divorced. 1 son - '71, 1 drtr '73. Has grandchildren. Lives alone. On disability.  Lives in [place].    Family History  Problem Relation Age of Onset  . Heart disease Mother   . Kidney disease Father   . COPD Sister   . Cancer Neg Hx   . Diabetes Neg Hx     Allergies  Allergen Reactions  . Meperidine Hcl     Current Outpatient Prescriptions  Medication Sig Dispense Refill  . acetaminophen (TYLENOL) 325 MG tablet Take 650 mg by mouth every 6 (six) hours as needed. 1-2 by mouth every 4 hours prn       . amLODipine (NORVASC) 10 MG tablet Take 10 mg by mouth daily.        Marland Kitchen aspirin 81 MG tablet Take 81 mg by mouth daily.        Marland Kitchen  atorvastatin (LIPITOR) 80 MG tablet Take 40 mg by mouth daily.       . benazepril (LOTENSIN) 40 MG tablet Take 40 mg by mouth daily.        . carvedilol (COREG) 25 MG tablet Take 25 mg by mouth 2 (two) times daily with a meal.        . clopidogrel (PLAVIX) 75 MG tablet Take 75 mg by mouth daily.        Marland Kitchen FLUoxetine (PROZAC) 20 MG tablet Take 20 mg by mouth daily.        . furosemide (LASIX) 20 MG tablet Take 20 mg by mouth daily.        . nitroGLYCERIN (NITROSTAT) 0.4 MG SL tablet Place 0.4 mg under the tongue every 5 (five) minutes as needed.        Marland Kitchen spironolactone (ALDACTONE) 25 MG tablet Take 25 mg by mouth daily.        Marland Kitchen valproic acid (DEPAKENE) 250 MG capsule Take 250 mg by mouth 2 (two) times daily.          ROS- all systems are reviewed and negative except as per HPI  Physical Exam: Filed Vitals:   08/04/11 1548  BP: 128/81  Pulse: 63  Resp:  18  Height: 5\' 6"  (1.676 m)  Weight: 141 lb (63.957 kg)    GEN- The patient is well appearing, alert and oriented x 3 today.  dissheveled Head- normocephalic, atraumatic Eyes-  Sclera clear, conjunctiva pink Ears- hearing intact Oropharynx- clear Neck- supple, no JVP Lymph- no cervical lymphadenopathy Lungs- Clear to ausculation bilaterally, normal work of breathing Chest- BiV ICDt is well healed Heart- Regular rate and rhythm, no murmurs, rubs or gallops, PMI not laterally displaced GI- soft, NT, ND, + BS Extremities- no clubbing, cyanosis, trace edema Skin- no rash or lesion Psych- euthymic mood, full affect, denies SI/HI, denies hallucinations Neuro- strength and sensation are intact  BiV ICD interrogation- reviewed in detail today,  See PACEART report  Echo 04/26/2008- LVEDD 67, EF 20-25%, LA 45, mild mR  Assessment and Plan:

## 2011-08-20 ENCOUNTER — Other Ambulatory Visit: Payer: Self-pay | Admitting: Internal Medicine

## 2011-08-20 ENCOUNTER — Other Ambulatory Visit: Payer: Self-pay | Admitting: *Deleted

## 2011-08-20 MED ORDER — FUROSEMIDE 20 MG PO TABS: 20.0000 mg | ORAL_TABLET | Freq: Every day | ORAL | Status: DC

## 2011-08-20 MED ORDER — BENAZEPRIL HCL 40 MG PO TABS: 40.0000 mg | ORAL_TABLET | Freq: Every day | ORAL | Status: DC

## 2011-08-20 MED ORDER — FLUOXETINE HCL 20 MG PO TABS: 20.0000 mg | ORAL_TABLET | Freq: Every day | ORAL | Status: DC

## 2011-08-20 MED ORDER — CARVEDILOL 25 MG PO TABS: 25.0000 mg | ORAL_TABLET | Freq: Two times a day (BID) | ORAL | Status: DC

## 2011-08-20 NOTE — Telephone Encounter (Signed)
FU Call: Pt said only lipitor was called in. Please call in all other medications as well.

## 2011-08-20 NOTE — Telephone Encounter (Signed)
Pt needs refill on Benazepril 20mg  qd, lasix 20mg  qd, prozac 20mg  qd, coreg 25mg  bid, lipitor 40mg  qd called into [facility] on [address]. Please call patient when these have been called in please he needs to go pick them up

## 2011-08-20 NOTE — Telephone Encounter (Signed)
OK to refill fluoxetine 20 mg # 30 m, sig 1 qd, refill 11

## 2011-08-20 NOTE — Telephone Encounter (Signed)
Request for Fluoxetine refill [last date shown in [facility] for med was 05.13.2009] Please advise.

## 2011-08-21 MED ORDER — FLUOXETINE HCL 20 MG PO TABS: 20.0000 mg | ORAL_TABLET | Freq: Every day | ORAL | Status: DC

## 2011-08-21 NOTE — Telephone Encounter (Signed)
Done.  Informed patient.

## 2011-10-10 ENCOUNTER — Telehealth: Payer: Self-pay | Admitting: Internal Medicine

## 2011-10-10 MED ORDER — CLOPIDOGREL BISULFATE 75 MG PO TABS: 75.0000 mg | ORAL_TABLET | Freq: Every day | ORAL | Status: DC

## 2011-10-10 NOTE — Telephone Encounter (Signed)
Wants generic Plavix  I sent in for him to his pharmacy and he will pick up when he gets paid.  I explained to him the importance of the medication and taking it  He is somewhat difficult to understand over the phone

## 2011-10-10 NOTE — Telephone Encounter (Signed)
Pt calling to speak with a nurse re a problem with an rx

## 2011-10-29 ENCOUNTER — Other Ambulatory Visit: Payer: Self-pay

## 2011-10-29 ENCOUNTER — Other Ambulatory Visit (HOSPITAL_COMMUNITY): Payer: Medicare Other | Admitting: Radiology

## 2011-10-29 ENCOUNTER — Ambulatory Visit (HOSPITAL_COMMUNITY): Payer: Medicare Other | Attending: Cardiovascular Disease

## 2011-10-29 DIAGNOSIS — I079 Rheumatic tricuspid valve disease, unspecified: Secondary | ICD-10-CM | POA: Insufficient documentation

## 2011-10-29 DIAGNOSIS — I428 Other cardiomyopathies: Secondary | ICD-10-CM | POA: Insufficient documentation

## 2011-10-29 DIAGNOSIS — E785 Hyperlipidemia, unspecified: Secondary | ICD-10-CM | POA: Insufficient documentation

## 2011-10-29 DIAGNOSIS — I059 Rheumatic mitral valve disease, unspecified: Secondary | ICD-10-CM | POA: Insufficient documentation

## 2011-10-29 DIAGNOSIS — I10 Essential (primary) hypertension: Secondary | ICD-10-CM | POA: Insufficient documentation

## 2011-10-29 DIAGNOSIS — F17200 Nicotine dependence, unspecified, uncomplicated: Secondary | ICD-10-CM | POA: Insufficient documentation

## 2011-11-05 ENCOUNTER — Encounter: Payer: Self-pay | Admitting: Internal Medicine

## 2011-11-05 ENCOUNTER — Telehealth: Payer: Self-pay | Admitting: Internal Medicine

## 2011-11-05 ENCOUNTER — Ambulatory Visit (INDEPENDENT_AMBULATORY_CARE_PROVIDER_SITE_OTHER): Payer: Medicare Other | Admitting: Internal Medicine

## 2011-11-05 DIAGNOSIS — I428 Other cardiomyopathies: Secondary | ICD-10-CM

## 2011-11-05 DIAGNOSIS — I509 Heart failure, unspecified: Secondary | ICD-10-CM

## 2011-11-05 DIAGNOSIS — Z720 Tobacco use: Secondary | ICD-10-CM

## 2011-11-05 DIAGNOSIS — I10 Essential (primary) hypertension: Secondary | ICD-10-CM

## 2011-11-05 DIAGNOSIS — E785 Hyperlipidemia, unspecified: Secondary | ICD-10-CM

## 2011-11-05 DIAGNOSIS — I519 Heart disease, unspecified: Secondary | ICD-10-CM

## 2011-11-05 DIAGNOSIS — F17200 Nicotine dependence, unspecified, uncomplicated: Secondary | ICD-10-CM

## 2011-11-05 LAB — ICD DEVICE OBSERVATION
AL THRESHOLD: 0.5 V
BATTERY VOLTAGE: 3.0469 V
LV LEAD IMPEDENCE ICD: 589 Ohm
RV LEAD AMPLITUDE: 14.75 mv
RV LEAD THRESHOLD: 0.5 V
TOT-0006: 20110216000000
TZAT-0001ATACH: 1
TZAT-0001ATACH: 2
TZAT-0001ATACH: 3
TZAT-0001SLOWVT: 1
TZAT-0001SLOWVT: 2
TZAT-0002ATACH: NEGATIVE
TZAT-0002ATACH: NEGATIVE
TZAT-0002FASTVT: NEGATIVE
TZAT-0004SLOWVT: 8
TZAT-0004SLOWVT: 8
TZAT-0005SLOWVT: 84 pct
TZAT-0005SLOWVT: 91 pct
TZAT-0012ATACH: 150 ms
TZAT-0012ATACH: 150 ms
TZAT-0012FASTVT: 200 ms
TZAT-0018ATACH: NEGATIVE
TZAT-0018ATACH: NEGATIVE
TZAT-0018FASTVT: NEGATIVE
TZAT-0018SLOWVT: NEGATIVE
TZAT-0019ATACH: 6 V
TZAT-0019ATACH: 6 V
TZAT-0019FASTVT: 8 V
TZAT-0020ATACH: 1.5 ms
TZAT-0020SLOWVT: 1.5 ms
TZAT-0020SLOWVT: 1.5 ms
TZON-0003VSLOWVT: 350 ms
TZON-0004VSLOWVT: 36
TZON-0010SLOWVT: 40 ms
TZST-0001ATACH: 6
TZST-0001FASTVT: 5
TZST-0001FASTVT: 6
TZST-0001SLOWVT: 3
TZST-0001SLOWVT: 5
TZST-0001SLOWVT: 6
TZST-0002ATACH: NEGATIVE
TZST-0002ATACH: NEGATIVE
TZST-0002FASTVT: NEGATIVE
TZST-0002FASTVT: NEGATIVE
TZST-0002FASTVT: NEGATIVE
TZST-0002FASTVT: NEGATIVE
TZST-0003SLOWVT: 35 J
TZST-0003SLOWVT: 35 J
VENTRICULAR PACING ICD: 98.94 pct
VF: 0

## 2011-11-05 LAB — HEPATIC FUNCTION PANEL
AST: 22 U/L (ref 0–37)
Albumin: 3.9 g/dL (ref 3.5–5.2)
Alkaline Phosphatase: 74 U/L (ref 39–117)
Total Protein: 7.3 g/dL (ref 6.0–8.3)

## 2011-11-05 LAB — BASIC METABOLIC PANEL
CO2: 30 mEq/L (ref 19–32)
Chloride: 104 mEq/L (ref 96–112)
Creatinine, Ser: 1.1 mg/dL (ref 0.4–1.5)
Sodium: 141 mEq/L (ref 135–145)

## 2011-11-05 LAB — LIPID PANEL
Cholesterol: 155 mg/dL (ref 0–200)
HDL: 77.7 mg/dL (ref >39.00)
LDL Cholesterol: 66 mg/dL (ref 0–99)
Triglycerides: 55 mg/dL (ref 0.0–149.0)

## 2011-11-05 NOTE — Patient Instructions (Signed)
Your physician recommends that you schedule a follow-up appointment in: 3 months in the device clinic.  Your physician wants you to follow-up in: 12 months with Dr. Johney Frame.  You will receive a reminder letter in the mail two months in advance. If you don't receive a letter, please call our office to schedule the follow-up appointment.  Your physician wants you to follow-up in: 6 months with Norma Fredrickson, NP. You will receive a reminder letter in the mail two months in advance. If you don't receive a letter, please call our office to schedule the follow-up appointment.  LABS TODAY:  BMET, LIPIDS, LIVER

## 2011-11-05 NOTE — Telephone Encounter (Signed)
Pt had echo and was told his heart is abnormal but wants to know what % is blocked?

## 2011-11-05 NOTE — Assessment & Plan Note (Signed)
He quit 4 weeks ago.

## 2011-11-05 NOTE — Assessment & Plan Note (Signed)
Stable No change required today  

## 2011-11-05 NOTE — Progress Notes (Signed)
PCP: Dr Debby Bud Primary Cardiologist: Previously Dr Serena Croissant is a 66 y.o. male with a h/o ischemic CM and chronic systolic dysfunction sp BiV ICD implantation (MDT) by Dr Chales Abrahams  who presents today for follow-up in the  Electrophysiology device clinic.  He is s/p PCI of the LAD 2007 at [facility].  He also has chronic systolic dysfunction and underwent BiV ICD implantation 2006.  His EF remains depressed.   He has bipolar disorder which is stable.  He is primarily limited by his Multiple Sclerosis.     Today, he  denies symptoms of palpitations, chest pain, shortness of breath, orthopnea, PND, lower extremity edema, dizziness, presyncope, or syncope.  The patientis tolerating medications without difficulties and is otherwise without complaint today.   Past Medical History  Diagnosis Date  . Coronary artery disease     s/p PCI of LAD with BMS 04/03/06 at [facility]  . Hyperlipidemia   . Hypertension   . Cerebrovascular accident     history of cerebrovascular accident with residual left hemiparesis in 2002, seen at [facility]  . Multiple sclerosis   . Ischemic cardiomyopathy     s/p BiV ICD implanted 2007 at [facility]  . Chronic systolic congestive heart failure   . Bipolar disorder    Past Surgical History  Procedure Date  . Tonsillectomy   . Vasectomy   . Implant of automatic cardioverter defibrillator     December 26, 2004 MDT BiV ICD implanted by Dr Chales Abrahams in [facility]  . Ptca     PCI LAD with BMS 04/03/06    History   Social History  . Marital Status: Divorced    Spouse Name: N/A    Number of Children: N/A  . Years of Education: N/A   Occupational History  . Not on file.   Social History Main Topics  . Smoking status: Former Smoker -- 0.5 packs/day for 40 years    Types: Cigarettes    Quit date: 10/13/2011  . Smokeless tobacco: Never Used   Comment: has tried to quit "many times" will continue to try to quit  . Alcohol Use:  Yes     remote heavy ETOH, "years ago"  . Drug Use: No  . Sexually Active: Not on file   Other Topics Concern  . Not on file   Social History Narrative   HSG. [employer] - 09 months. Married '70's - 63yrs/divorce. Married '80's - 10 yrs/divorced. 1 son - '71, 1 drtr '73. Has grandchildren. Lives alone. On disability.  Lives in [place].    Family History  Problem Relation Age of Onset  . Heart disease Mother   . Kidney disease Father   . COPD Sister   . Cancer Neg Hx   . Diabetes Neg Hx     Allergies  Allergen Reactions  . Meperidine Hcl     Current Outpatient Prescriptions  Medication Sig Dispense Refill  . aspirin 81 MG tablet Take 81 mg by mouth daily.        Marland Kitchen atorvastatin (LIPITOR) 80 MG tablet Take 40 mg by mouth daily.       . benazepril (LOTENSIN) 40 MG tablet Take 1 tablet (40 mg total) by mouth daily.  30 tablet  6  . carvedilol (COREG) 25 MG tablet Take 1 tablet (25 mg total) by mouth 2 (two) times daily with a meal.  60 tablet  6  . clopidogrel (PLAVIX) 75 MG tablet Take  1 tablet (75 mg total) by mouth daily.  30 tablet  6  . FLUoxetine (PROZAC) 20 MG tablet Take 1 tablet (20 mg total) by mouth daily.  30 tablet  11  . furosemide (LASIX) 20 MG tablet Take 1 tablet (20 mg total) by mouth daily.  30 tablet  6  . nitroGLYCERIN (NITROSTAT) 0.4 MG SL tablet Place 0.4 mg under the tongue every 5 (five) minutes as needed.        Marland Kitchen spironolactone (ALDACTONE) 25 MG tablet Take 25 mg by mouth daily.        Marland Kitchen valproic acid (DEPAKENE) 250 MG capsule Take 250 mg by mouth 2 (two) times daily.         Physical Exam: Filed Vitals:   11/05/11 1400  BP: 122/78  Pulse: 60  Height: 5\' 6"  (1.676 m)  Weight: 128 lb (58.06 kg)    GEN- The patient is well appearing, alert and oriented x 3 today.  dissheveled Head- normocephalic, atraumatic Eyes-  Sclera clear, conjunctiva pink Ears- hearing intact Oropharynx- clear Neck- supple, no JVP Lymph- no cervical lymphadenopathy Lungs-  Clear to ausculation bilaterally, normal work of breathing Chest- BiV ICDt is well healed Heart- Regular rate and rhythm, no murmurs, rubs or gallops, PMI not laterally displaced GI- soft, NT, ND, + BS Extremities- no clubbing, cyanosis, trace edema  BiV ICD interrogation- reviewed in detail today,  See PACEART report  Assessment and Plan:

## 2011-11-05 NOTE — Assessment & Plan Note (Signed)
EF is chronically depressed.  He is doing reasonably well with CRT. Normal BiV ICD function See Pace Art report No changes today

## 2011-11-10 NOTE — Telephone Encounter (Signed)
Please inform pt that his EF is 25%.  This is unchanged from the echo we have on file from [facility] from 2009.

## 2011-11-10 NOTE — Telephone Encounter (Signed)
Spoke with the patient and his EF is 25%, this is the same as 2009

## 2012-02-04 ENCOUNTER — Encounter: Payer: Self-pay | Admitting: Internal Medicine

## 2012-02-04 ENCOUNTER — Ambulatory Visit (INDEPENDENT_AMBULATORY_CARE_PROVIDER_SITE_OTHER): Payer: Medicare Other | Admitting: *Deleted

## 2012-02-04 DIAGNOSIS — I428 Other cardiomyopathies: Secondary | ICD-10-CM

## 2012-02-04 DIAGNOSIS — I519 Heart disease, unspecified: Secondary | ICD-10-CM

## 2012-02-04 LAB — ICD DEVICE OBSERVATION
AL IMPEDENCE ICD: 418 Ohm
ATRIAL PACING ICD: 34.79 pct
BAMS-0001: 170 {beats}/min
FVT: 0
LV LEAD THRESHOLD: 1.5 V
PACEART VT: 0
RV LEAD THRESHOLD: 0.5 V
TOT-0006: 20110216000000
TZAT-0001ATACH: 1
TZAT-0001ATACH: 2
TZAT-0001SLOWVT: 2
TZAT-0002ATACH: NEGATIVE
TZAT-0002ATACH: NEGATIVE
TZAT-0002ATACH: NEGATIVE
TZAT-0004SLOWVT: 8
TZAT-0004SLOWVT: 8
TZAT-0005SLOWVT: 84 pct
TZAT-0005SLOWVT: 91 pct
TZAT-0011SLOWVT: 10 ms
TZAT-0011SLOWVT: 10 ms
TZAT-0012ATACH: 150 ms
TZAT-0013SLOWVT: 2
TZAT-0018ATACH: NEGATIVE
TZAT-0018ATACH: NEGATIVE
TZAT-0018ATACH: NEGATIVE
TZAT-0018FASTVT: NEGATIVE
TZAT-0018SLOWVT: NEGATIVE
TZAT-0018SLOWVT: NEGATIVE
TZAT-0019ATACH: 6 V
TZAT-0019ATACH: 6 V
TZAT-0019FASTVT: 8 V
TZAT-0020ATACH: 1.5 ms
TZAT-0020FASTVT: 1.5 ms
TZON-0003ATACH: 350 ms
TZON-0004SLOWVT: 32
TZON-0004VSLOWVT: 36
TZON-0005SLOWVT: 12
TZST-0001FASTVT: 2
TZST-0001FASTVT: 3
TZST-0001FASTVT: 6
TZST-0001SLOWVT: 3
TZST-0001SLOWVT: 4
TZST-0001SLOWVT: 6
TZST-0002ATACH: NEGATIVE
TZST-0002ATACH: NEGATIVE
TZST-0002FASTVT: NEGATIVE
TZST-0002FASTVT: NEGATIVE
TZST-0003SLOWVT: 35 J
VENTRICULAR PACING ICD: 98.64 pct
VF: 0

## 2012-02-04 NOTE — Progress Notes (Signed)
ICD check with ICM 

## 2012-05-06 ENCOUNTER — Ambulatory Visit (INDEPENDENT_AMBULATORY_CARE_PROVIDER_SITE_OTHER): Payer: Medicare Other | Admitting: Cardiology

## 2012-05-06 DIAGNOSIS — I255 Ischemic cardiomyopathy: Secondary | ICD-10-CM

## 2012-05-06 DIAGNOSIS — Z95810 Presence of automatic (implantable) cardiac defibrillator: Secondary | ICD-10-CM

## 2012-05-06 DIAGNOSIS — I5022 Chronic systolic (congestive) heart failure: Secondary | ICD-10-CM

## 2012-05-06 NOTE — Progress Notes (Signed)
Device check only. See PaceArt report. 

## 2012-06-30 ENCOUNTER — Encounter: Payer: Self-pay | Admitting: Internal Medicine

## 2012-06-30 ENCOUNTER — Ambulatory Visit (INDEPENDENT_AMBULATORY_CARE_PROVIDER_SITE_OTHER): Payer: Medicare Other | Admitting: Internal Medicine

## 2012-06-30 VITALS — BP 118/80 | HR 70 | Temp 96.9°F | Ht 66.0 in | Wt 135.0 lb

## 2012-06-30 DIAGNOSIS — K4090 Unilateral inguinal hernia, without obstruction or gangrene, not specified as recurrent: Secondary | ICD-10-CM

## 2012-06-30 NOTE — Progress Notes (Signed)
Subjective:    Patient ID: [name], male    DOB: 08-Nov-1945, 66 y.o.   MRN: [id number]  HPI [name] presents for evaluation of a bulge in the right groin. This has been present for several months - it has not been getting bigger, it is reducible and not tender.  He has been summoned to [employer] and needs an excuse.  Past Medical History  Diagnosis Date  . Coronary artery disease     s/p PCI of LAD with BMS 04/03/06 at [facility]  . Hyperlipidemia   . Hypertension   . Cerebrovascular accident     history of cerebrovascular accident with residual left hemiparesis in 2002, seen at [facility]  . Multiple sclerosis   . Ischemic cardiomyopathy     s/p BiV ICD implanted 2007 at [facility]  . Chronic systolic congestive heart failure   . Bipolar disorder    Past Surgical History  Procedure Date  . Tonsillectomy   . Vasectomy   . Implant of automatic cardioverter defibrillator     December 26, 2004 MDT BiV ICD implanted by Dr Chales Abrahams in [facility]  . Ptca     PCI LAD with BMS 04/03/06   Family History  Problem Relation Age of Onset  . Heart disease Mother   . Kidney disease Father   . COPD Sister   . Cancer Neg Hx   . Diabetes Neg Hx    History   Social History  . Marital Status: Divorced    Spouse Name: N/A    Number of Children: N/A  . Years of Education: N/A   Occupational History  . Not on file.   Social History Main Topics  . Smoking status: Former Smoker -- 0.5 packs/day for 40 years    Types: Cigarettes    Quit date: 10/13/2011  . Smokeless tobacco: Never Used   Comment: has tried to quit "many times" will continue to try to quit  . Alcohol Use: Yes     remote heavy ETOH, "years ago"  . Drug Use: No  . Sexually Active: Not on file   Other Topics Concern  . Not on file   Social History Narrative   HSG. [employer] - 09 months. Married '70's - 70yrs/divorce. Married '80's - 10 yrs/divorced. 1 son - '71, 1 drtr '73. Has grandchildren.  Lives alone. On disability.  Lives in [place].    Current Outpatient Prescriptions on File Prior to Visit  Medication Sig Dispense Refill  . aspirin 81 MG tablet Take 81 mg by mouth daily.        Marland Kitchen atorvastatin (LIPITOR) 40 MG tablet Take 40 mg by mouth daily.      . benazepril (LOTENSIN) 40 MG tablet Take 1 tablet (40 mg total) by mouth daily.  30 tablet  6  . carvedilol (COREG) 25 MG tablet Take 1 tablet (25 mg total) by mouth 2 (two) times daily with a meal.  60 tablet  6  . clopidogrel (PLAVIX) 75 MG tablet Take 1 tablet (75 mg total) by mouth daily.  30 tablet  6  . FLUoxetine (PROZAC) 20 MG tablet Take 1 tablet (20 mg total) by mouth daily.  30 tablet  11  . furosemide (LASIX) 20 MG tablet Take 1 tablet (20 mg total) by mouth daily.  30 tablet  6  . nitroGLYCERIN (NITROSTAT) 0.4 MG SL tablet Place 0.4 mg under the tongue every 5 (five) minutes as needed.        Marland Kitchen  spironolactone (ALDACTONE) 25 MG tablet Take 25 mg by mouth daily.        Marland Kitchen valproic acid (DEPAKENE) 250 MG capsule Take 250 mg by mouth 2 (two) times daily.            Review of Systems System review is negative for any constitutional, cardiac, pulmonary, GI or neuro symptoms or complaints other than as described in the HPI.     Objective:   Physical Exam Filed Vitals:   06/30/12 1659  BP: 118/80  Pulse: 70  Temp: 96.9 F (36.1 C)   Cor- RRR Pulm - normal respirations Abd - large 7 cm bulge right groin, soft, reducible and not tender.       Assessment & Plan:

## 2012-06-30 NOTE — Patient Instructions (Addendum)
Right inguinal hernia - it is large but not acute.   Plan   You can wait and watch: there is a need for repair if the hernia is getting noticeably bigger, if it becomes difficult to push it in.  If the hernia gets tender and it cannot be pushed in that is a MEDICAL/SURGICAL EMERGENCY AND NEEDS IMMEDIATE ATTENTION.   Inguinal Hernia, Adult Muscles help keep everything in the body in its proper place. But if a weak spot in the muscles develops, something can poke through. That is called a hernia. When this happens in the lower part of the belly (abdomen), it is called an inguinal hernia. (It takes its name from a part of the body in this region called the inguinal canal.) A weak spot in the wall of muscles lets some fat or part of the small intestine bulge through. An inguinal hernia can develop at any age. Men get them more often than women. CAUSES   In adults, an inguinal hernia develops over time.  It can be triggered by:   Suddenly straining the muscles of the lower abdomen.   Lifting heavy objects.   Straining to have a bowel movement. Difficult bowel movements (constipation) can lead to this.   Constant coughing. This may be caused by smoking or lung disease.   Being overweight.   Being pregnant.   Working at a job that requires long periods of standing or heavy lifting.   Having had an inguinal hernia before.  One type can be an emergency situation. It is called a strangulated inguinal hernia. It develops if part of the small intestine slips through the weak spot and cannot get back into the abdomen. The blood supply can be cut off. If that happens, part of the intestine may die. This situation requires emergency surgery. SYMPTOMS   Often, a small inguinal hernia has no symptoms. It is found when a healthcare provider does a physical exam. Larger hernias usually have symptoms.    In adults, symptoms may include:   A lump in the groin. This is easier to see when the person is  standing. It might disappear when lying down.   In men, a lump in the scrotum.   Pain or burning in the groin. This occurs especially when lifting, straining or coughing.   A dull ache or feeling of pressure in the groin.   Signs of a strangulated hernia can include:   A bulge in the groin that becomes very painful and tender to the touch.   A bulge that turns red or purple.   Fever, nausea and vomiting.   Inability to have a bowel movement or to pass gas.  DIAGNOSIS   To decide if you have an inguinal hernia, a healthcare provider will probably do a physical examination.  This will include asking questions about any symptoms you have noticed.   The healthcare provider might feel the groin area and ask you to cough. If an inguinal hernia is felt, the healthcare provider may try to slide it back into the abdomen.   Usually no other tests are needed.  TREATMENT   Treatments can vary. The size of the hernia makes a difference. Options include:  Watchful waiting. This is often suggested if the hernia is small and you have had no symptoms.   No medical procedure will be done unless symptoms develop.   You will need to watch closely for symptoms. If any occur, contact your healthcare provider right away.  Surgery. This is used if the hernia is larger or you have symptoms.   Open surgery. This is usually an outpatient procedure (you will not stay overnight in a hospital). An cut (incision) is made through the skin in the groin. The hernia is put back inside the abdomen. The weak area in the muscles is then repaired by herniorrhaphy or hernioplasty. Herniorrhaphy: in this type of surgery, the weak muscles are sewn back together. Hernioplasty: a patch or mesh is used to close the weak area in the abdominal wall.   Laparoscopy. In this procedure, a surgeon makes small incisions. A thin tube with a tiny video camera (called a laparoscope) is put into the abdomen. The surgeon repairs the  hernia with mesh by looking with the video camera and using two long instruments.  HOME CARE INSTRUCTIONS    After surgery to repair an inguinal hernia:   You will need to take pain medicine prescribed by your healthcare provider. Follow all directions carefully.   You will need to take care of the wound from the incision.   Your activity will be restricted for awhile. This will probably include no heavy lifting for several weeks. You also should not do anything too active for a few weeks. When you can return to work will depend on the type of job that you have.   During "watchful waiting" periods, you should:   Maintain a healthy weight.   Eat a diet high in fiber (fruits, vegetables and whole grains).   Drink plenty of fluids to avoid constipation. This means drinking enough water and other liquids to keep your urine clear or pale yellow.   Do not lift heavy objects.   Do not stand for long periods of time.   Quit smoking. This should keep you from developing a frequent cough.  SEEK MEDICAL CARE IF:    A bulge develops in your groin area.   You feel pain, a burning sensation or pressure in the groin. This might be worse if you are lifting or straining.   You develop a fever of more than 100.5 F (38.1 C).  SEEK IMMEDIATE MEDICAL CARE IF:    Pain in the groin increases suddenly.   A bulge in the groin gets bigger suddenly and does not go down.   For men, there is sudden pain in the scrotum. Or, the size of the scrotum increases.   A bulge in the groin area becomes red or purple and is painful to touch.   You have nausea or vomiting that does not go away.   You feel your heart beating much faster than normal.   You cannot have a bowel movement or pass gas.   You develop a fever of more than 102.0 F (38.9 C).  Document Released: 01/11/2009 Document Revised: 11/17/2011 Document Reviewed: 01/11/2009 [facility] Patient Information 2013 [place], [place].

## 2012-07-04 DIAGNOSIS — K4090 Unilateral inguinal hernia, without obstruction or gangrene, not specified as recurrent: Secondary | ICD-10-CM | POA: Insufficient documentation

## 2012-07-04 NOTE — Assessment & Plan Note (Signed)
Right inguinal hernia - it is large but not acute.   Plan   You can wait and watch: there is a need for repair if the hernia is getting noticeably bigger, if it becomes difficult to push it in.  If the hernia gets tender and it cannot be pushed in that is a MEDICAL/SURGICAL EMERGENCY AND NEEDS IMMEDIATE ATTENTION.

## 2012-09-06 ENCOUNTER — Encounter: Payer: Self-pay | Admitting: Internal Medicine

## 2012-09-06 ENCOUNTER — Ambulatory Visit (INDEPENDENT_AMBULATORY_CARE_PROVIDER_SITE_OTHER): Payer: Medicare Other | Admitting: *Deleted

## 2012-09-06 DIAGNOSIS — Z95810 Presence of automatic (implantable) cardiac defibrillator: Secondary | ICD-10-CM

## 2012-09-06 DIAGNOSIS — I519 Heart disease, unspecified: Secondary | ICD-10-CM

## 2012-09-06 DIAGNOSIS — I428 Other cardiomyopathies: Secondary | ICD-10-CM

## 2012-09-06 LAB — ICD DEVICE OBSERVATION
AL AMPLITUDE: 5.375 mv
CHARGE TIME: 9.439 s
FVT: 0
LV LEAD IMPEDENCE ICD: 684 Ohm
LV LEAD THRESHOLD: 1.5 V
RV LEAD AMPLITUDE: 26.875 mv
RV LEAD IMPEDENCE ICD: 646 Ohm
TOT-0001: 0
TOT-0006: 20110216000000
TZAT-0001ATACH: 3
TZAT-0001FASTVT: 1
TZAT-0002ATACH: NEGATIVE
TZAT-0002ATACH: NEGATIVE
TZAT-0002FASTVT: NEGATIVE
TZAT-0011SLOWVT: 10 ms
TZAT-0012ATACH: 150 ms
TZAT-0012ATACH: 150 ms
TZAT-0012FASTVT: 200 ms
TZAT-0012SLOWVT: 200 ms
TZAT-0018ATACH: NEGATIVE
TZAT-0018SLOWVT: NEGATIVE
TZAT-0018SLOWVT: NEGATIVE
TZAT-0019ATACH: 6 V
TZAT-0019ATACH: 6 V
TZAT-0019SLOWVT: 8 V
TZAT-0019SLOWVT: 8 V
TZAT-0020ATACH: 1.5 ms
TZAT-0020ATACH: 1.5 ms
TZAT-0020ATACH: 1.5 ms
TZAT-0020SLOWVT: 1.5 ms
TZAT-0020SLOWVT: 1.5 ms
TZON-0003VSLOWVT: 350 ms
TZON-0005SLOWVT: 12
TZON-0010FASTVT: 40 ms
TZON-0010SLOWVT: 40 ms
TZST-0001ATACH: 5
TZST-0001ATACH: 6
TZST-0001FASTVT: 3
TZST-0001FASTVT: 4
TZST-0001FASTVT: 5
TZST-0001SLOWVT: 4
TZST-0001SLOWVT: 5
TZST-0002ATACH: NEGATIVE
TZST-0002FASTVT: NEGATIVE
TZST-0002FASTVT: NEGATIVE
TZST-0002FASTVT: NEGATIVE
TZST-0003SLOWVT: 25 J
TZST-0003SLOWVT: 35 J

## 2012-09-06 NOTE — Progress Notes (Signed)
Pt seen in clinic for follow up of ICD.  No complaints of chest pain, shortness of breath, dizziness, palpitations, or shocks.  Device functioning normally at this time.  For full details, see PaceArt report.  No programming changes made today.  Plan to follow up in 3 months with Dr Johney Frame.   Gypsy Balsam, RN, BSN 09/06/2012 2:10 PM

## 2012-12-24 ENCOUNTER — Other Ambulatory Visit: Payer: Self-pay | Admitting: Emergency Medicine

## 2012-12-24 MED ORDER — BENAZEPRIL HCL 40 MG PO TABS: 40.0000 mg | ORAL_TABLET | Freq: Every day | ORAL | Status: DC

## 2013-03-24 ENCOUNTER — Telehealth: Payer: Self-pay

## 2013-03-24 NOTE — Telephone Encounter (Signed)
Ok to add on

## 2013-03-24 NOTE — Telephone Encounter (Signed)
Pt didn't want to come to come here.  He has no transportation.  He wants to know if he should go to the ER if worse.  He states he has a hernia.

## 2013-03-24 NOTE — Telephone Encounter (Signed)
Phone call from patient stating he is having right side abdominal pain since last night. No other symptoms. He's requesting to be seen today. Please advise. Thanks.

## 2013-03-24 NOTE — Telephone Encounter (Signed)
Since patient did not want to come here I let him know if his pain is bad or worsens then he will need to go to the ER

## 2013-03-24 NOTE — Telephone Encounter (Signed)
Per Dr Debby Bud please call patient to add him to schedule this afternoon. Thank you.

## 2013-03-30 ENCOUNTER — Encounter: Payer: Self-pay | Admitting: *Deleted

## 2013-05-27 ENCOUNTER — Encounter: Payer: Self-pay | Admitting: Internal Medicine

## 2013-05-27 ENCOUNTER — Telehealth: Payer: Self-pay | Admitting: Internal Medicine

## 2013-05-27 NOTE — Telephone Encounter (Signed)
05-27-13 sent pt past due letter for defib ck, was due with allred 10/2012/mt

## 2013-08-05 ENCOUNTER — Emergency Department (HOSPITAL_COMMUNITY): Payer: Medicare Other

## 2013-08-05 ENCOUNTER — Encounter (HOSPITAL_COMMUNITY): Payer: Self-pay | Admitting: Emergency Medicine

## 2013-08-05 ENCOUNTER — Inpatient Hospital Stay (HOSPITAL_COMMUNITY): Payer: Medicare Other

## 2013-08-05 ENCOUNTER — Inpatient Hospital Stay (HOSPITAL_COMMUNITY)
Admission: EM | Admit: 2013-08-05 | Discharge: 2013-08-12 | DRG: 064 | Disposition: A | Discharge status: Skilled Nursing Facility | Payer: Medicare Other | Attending: Internal Medicine | Admitting: Internal Medicine

## 2013-08-05 DIAGNOSIS — I63219 Cerebral infarction due to unspecified occlusion or stenosis of unspecified vertebral arteries: Principal | ICD-10-CM | POA: Diagnosis present

## 2013-08-05 DIAGNOSIS — E43 Unspecified severe protein-calorie malnutrition: Secondary | ICD-10-CM | POA: Diagnosis present

## 2013-08-05 DIAGNOSIS — F319 Bipolar disorder, unspecified: Secondary | ICD-10-CM

## 2013-08-05 DIAGNOSIS — I771 Stricture of artery: Secondary | ICD-10-CM

## 2013-08-05 DIAGNOSIS — I428 Other cardiomyopathies: Secondary | ICD-10-CM

## 2013-08-05 DIAGNOSIS — Z9861 Coronary angioplasty status: Secondary | ICD-10-CM

## 2013-08-05 DIAGNOSIS — G9340 Encephalopathy, unspecified: Secondary | ICD-10-CM

## 2013-08-05 DIAGNOSIS — R4701 Aphasia: Secondary | ICD-10-CM | POA: Diagnosis present

## 2013-08-05 DIAGNOSIS — I5042 Chronic combined systolic (congestive) and diastolic (congestive) heart failure: Secondary | ICD-10-CM | POA: Diagnosis present

## 2013-08-05 DIAGNOSIS — G8190 Hemiplegia, unspecified affecting unspecified side: Secondary | ICD-10-CM | POA: Diagnosis present

## 2013-08-05 DIAGNOSIS — I69959 Hemiplegia and hemiparesis following unspecified cerebrovascular disease affecting unspecified side: Secondary | ICD-10-CM

## 2013-08-05 DIAGNOSIS — E861 Hypovolemia: Secondary | ICD-10-CM | POA: Diagnosis present

## 2013-08-05 DIAGNOSIS — Z720 Tobacco use: Secondary | ICD-10-CM

## 2013-08-05 DIAGNOSIS — J690 Pneumonitis due to inhalation of food and vomit: Secondary | ICD-10-CM | POA: Diagnosis present

## 2013-08-05 DIAGNOSIS — G35D Multiple sclerosis, unspecified: Secondary | ICD-10-CM

## 2013-08-05 DIAGNOSIS — R112 Nausea with vomiting, unspecified: Secondary | ICD-10-CM | POA: Diagnosis present

## 2013-08-05 DIAGNOSIS — IMO0002 Reserved for concepts with insufficient information to code with codable children: Secondary | ICD-10-CM

## 2013-08-05 DIAGNOSIS — I959 Hypotension, unspecified: Secondary | ICD-10-CM | POA: Diagnosis present

## 2013-08-05 DIAGNOSIS — I69322 Dysarthria following cerebral infarction: Secondary | ICD-10-CM

## 2013-08-05 DIAGNOSIS — Z95810 Presence of automatic (implantable) cardiac defibrillator: Secondary | ICD-10-CM

## 2013-08-05 DIAGNOSIS — Z8673 Personal history of transient ischemic attack (TIA), and cerebral infarction without residual deficits: Secondary | ICD-10-CM

## 2013-08-05 DIAGNOSIS — R4182 Altered mental status, unspecified: Secondary | ICD-10-CM

## 2013-08-05 DIAGNOSIS — I10 Essential (primary) hypertension: Secondary | ICD-10-CM | POA: Diagnosis present

## 2013-08-05 DIAGNOSIS — E785 Hyperlipidemia, unspecified: Secondary | ICD-10-CM | POA: Diagnosis present

## 2013-08-05 DIAGNOSIS — R471 Dysarthria and anarthria: Secondary | ICD-10-CM | POA: Diagnosis present

## 2013-08-05 DIAGNOSIS — J9600 Acute respiratory failure, unspecified whether with hypoxia or hypercapnia: Secondary | ICD-10-CM

## 2013-08-05 DIAGNOSIS — R7309 Other abnormal glucose: Secondary | ICD-10-CM | POA: Diagnosis present

## 2013-08-05 DIAGNOSIS — G35 Multiple sclerosis: Secondary | ICD-10-CM | POA: Diagnosis present

## 2013-08-05 DIAGNOSIS — I519 Heart disease, unspecified: Secondary | ICD-10-CM

## 2013-08-05 DIAGNOSIS — I2589 Other forms of chronic ischemic heart disease: Secondary | ICD-10-CM | POA: Diagnosis present

## 2013-08-05 DIAGNOSIS — F1010 Alcohol abuse, uncomplicated: Secondary | ICD-10-CM | POA: Diagnosis present

## 2013-08-05 DIAGNOSIS — I5022 Chronic systolic (congestive) heart failure: Secondary | ICD-10-CM | POA: Diagnosis present

## 2013-08-05 DIAGNOSIS — I2510 Atherosclerotic heart disease of native coronary artery without angina pectoris: Secondary | ICD-10-CM

## 2013-08-05 DIAGNOSIS — I739 Peripheral vascular disease, unspecified: Secondary | ICD-10-CM | POA: Diagnosis present

## 2013-08-05 DIAGNOSIS — I509 Heart failure, unspecified: Secondary | ICD-10-CM | POA: Diagnosis present

## 2013-08-05 DIAGNOSIS — Z87891 Personal history of nicotine dependence: Secondary | ICD-10-CM

## 2013-08-05 LAB — AMYLASE: Amylase: 55 U/L (ref 0–105)

## 2013-08-05 LAB — CBC WITH DIFFERENTIAL/PLATELET
Basophils Absolute: 0 10*3/uL (ref 0.0–0.1)
Basophils Relative: 0 % (ref 0–1)
Eosinophils Relative: 0 % (ref 0–5)
HCT: 37.2 % — ABNORMAL LOW (ref 39.0–52.0)
Lymphocytes Relative: 5 % — ABNORMAL LOW (ref 12–46)
MCV: 97.9 fL (ref 78.0–100.0)
Monocytes Absolute: 0.7 10*3/uL (ref 0.1–1.0)
Monocytes Relative: 5 % (ref 3–12)
Platelets: 243 10*3/uL (ref 150–400)
RBC: 3.8 MIL/uL — ABNORMAL LOW (ref 4.22–5.81)
RDW: 12.8 % (ref 11.5–15.5)
WBC: 13.8 10*3/uL — ABNORMAL HIGH (ref 4.0–10.5)

## 2013-08-05 LAB — HEPATIC FUNCTION PANEL
ALT: 9 U/L (ref 0–53)
AST: 24 U/L (ref 0–37)
Albumin: 2.8 g/dL — ABNORMAL LOW (ref 3.5–5.2)
Alkaline Phosphatase: 84 U/L (ref 39–117)
Bilirubin, Direct: 0.1 mg/dL (ref 0.0–0.3)
Indirect Bilirubin: 0.7 mg/dL (ref 0.3–0.9)
Total Bilirubin: 0.8 mg/dL (ref 0.3–1.2)
Total Protein: 7.3 g/dL (ref 6.0–8.3)

## 2013-08-05 LAB — PRO B NATRIURETIC PEPTIDE: Pro B Natriuretic peptide (BNP): 16611 pg/mL — ABNORMAL HIGH (ref 0–125)

## 2013-08-05 LAB — MRSA PCR SCREENING: MRSA by PCR: NEGATIVE

## 2013-08-05 LAB — POCT I-STAT 3, ART BLOOD GAS (G3+)
Acid-Base Excess: 1 mmol/L (ref 0.0–2.0)
O2 Saturation: 96 %
TCO2: 25 mmol/L (ref 0–100)
pCO2 arterial: 32.9 mmHg — ABNORMAL LOW (ref 35.0–45.0)
pH, Arterial: 7.48 — ABNORMAL HIGH (ref 7.350–7.450)

## 2013-08-05 LAB — COMPREHENSIVE METABOLIC PANEL
ALT: 9 U/L (ref 0–53)
AST: 19 U/L (ref 0–37)
Albumin: 2.7 g/dL — ABNORMAL LOW (ref 3.5–5.2)
BUN: 17 mg/dL (ref 6–23)
CO2: 22 mEq/L (ref 19–32)
Calcium: 8.8 mg/dL (ref 8.4–10.5)
Creatinine, Ser: 0.84 mg/dL (ref 0.50–1.35)
Glucose, Bld: 134 mg/dL — ABNORMAL HIGH (ref 70–99)
Sodium: 137 mEq/L (ref 135–145)
Total Protein: 7.4 g/dL (ref 6.0–8.3)

## 2013-08-05 LAB — RAPID URINE DRUG SCREEN, HOSP PERFORMED
Amphetamines: NOT DETECTED
Barbiturates: NOT DETECTED
Benzodiazepines: NOT DETECTED
Cocaine: NOT DETECTED

## 2013-08-05 LAB — TROPONIN I
Troponin I: <0.3 ng/mL (ref <0.30)
Troponin I: <0.3 ng/mL (ref <0.30)

## 2013-08-05 LAB — URINALYSIS, ROUTINE W REFLEX MICROSCOPIC
Bilirubin Urine: NEGATIVE
Glucose, UA: NEGATIVE mg/dL
Hgb urine dipstick: NEGATIVE
Ketones, ur: NEGATIVE mg/dL
Protein, ur: 30 mg/dL — AB
Urobilinogen, UA: 1 mg/dL (ref 0.0–1.0)

## 2013-08-05 LAB — GLUCOSE, CAPILLARY: Glucose-Capillary: 104 mg/dL — ABNORMAL HIGH (ref 70–99)

## 2013-08-05 LAB — POCT I-STAT, CHEM 8
BUN: 17 mg/dL (ref 6–23)
Calcium, Ion: 1.15 mmol/L (ref 1.13–1.30)
Creatinine, Ser: 1 mg/dL (ref 0.50–1.35)
Hemoglobin: 13.3 g/dL (ref 13.0–17.0)
Sodium: 139 mEq/L (ref 135–145)
TCO2: 24 mmol/L (ref 0–100)

## 2013-08-05 LAB — VITAMIN B12: Vitamin B-12: 163 pg/mL — ABNORMAL LOW (ref 211–911)

## 2013-08-05 LAB — AMMONIA: Ammonia: 14 umol/L (ref 11–60)

## 2013-08-05 LAB — URINE MICROSCOPIC-ADD ON

## 2013-08-05 LAB — ETHANOL: Alcohol, Ethyl (B): <11 mg/dL (ref 0–11)

## 2013-08-05 LAB — CK: Total CK: 92 U/L (ref 7–232)

## 2013-08-05 LAB — CREATININE, SERUM: Creatinine, Ser: 0.79 mg/dL (ref 0.50–1.35)

## 2013-08-05 LAB — PROTIME-INR: INR: 1.1 (ref 0.00–1.49)

## 2013-08-05 LAB — PROCALCITONIN: Procalcitonin: <0.1 ng/mL

## 2013-08-05 LAB — VALPROIC ACID LEVEL: Valproic Acid Lvl: <10 ug/mL — ABNORMAL LOW (ref 50.0–100.0)

## 2013-08-05 MED ORDER — THIAMINE HCL 100 MG/ML IJ SOLN
500.0000 mg | Freq: Three times a day (TID) | INTRAMUSCULAR | Status: DC
  Filled 2013-08-05: qty 5

## 2013-08-05 MED ORDER — ETOMIDATE 2 MG/ML IV SOLN
INTRAVENOUS | Status: DC | PRN
  Administered 2013-08-05: 20 mg via INTRAVENOUS

## 2013-08-05 MED ORDER — ALBUTEROL SULFATE (5 MG/ML) 0.5% IN NEBU: 2.5000 mg | INHALATION_SOLUTION | RESPIRATORY_TRACT | Status: DC | PRN

## 2013-08-05 MED ORDER — PIPERACILLIN-TAZOBACTAM 3.375 G IVPB
3.3750 g | Freq: Once | INTRAVENOUS | Status: AC
  Administered 2013-08-05: 3.375 g via INTRAVENOUS
  Filled 2013-08-05: qty 50

## 2013-08-05 MED ORDER — ACETAMINOPHEN 650 MG RE SUPP: 650.0000 mg | Freq: Four times a day (QID) | RECTAL | Status: DC | PRN

## 2013-08-05 MED ORDER — PROPOFOL 10 MG/ML IV EMUL
5.0000 ug/kg/min | INTRAVENOUS | Status: DC
  Administered 2013-08-05: 10 ug/kg/min via INTRAVENOUS

## 2013-08-05 MED ORDER — ACETAMINOPHEN 325 MG PO TABS: 650.0000 mg | ORAL_TABLET | Freq: Four times a day (QID) | ORAL | Status: DC | PRN

## 2013-08-05 MED ORDER — THIAMINE HCL 100 MG/ML IJ SOLN
500.0000 mg | Freq: Three times a day (TID) | INTRAVENOUS | Status: AC
  Administered 2013-08-05 – 2013-08-08 (×9): 500 mg via INTRAVENOUS
  Filled 2013-08-05 (×10): qty 5

## 2013-08-05 MED ORDER — HEPARIN SODIUM (PORCINE) 5000 UNIT/ML IJ SOLN: 5000.0000 [IU] | Freq: Three times a day (TID) | INTRAMUSCULAR | Status: DC

## 2013-08-05 MED ORDER — LORAZEPAM 2 MG/ML IJ SOLN
1.0000 mg | Freq: Once | INTRAMUSCULAR | Status: AC
  Administered 2013-08-05: 1 mg via INTRAVENOUS
  Filled 2013-08-05: qty 1

## 2013-08-05 MED ORDER — SODIUM CHLORIDE 0.9 % IJ SOLN
3.0000 mL | Freq: Two times a day (BID) | INTRAMUSCULAR | Status: DC
  Administered 2013-08-06 – 2013-08-12 (×5): 3 mL via INTRAVENOUS

## 2013-08-05 MED ORDER — THIAMINE HCL 100 MG/ML IJ SOLN
100.0000 mg | Freq: Every day | INTRAMUSCULAR | Status: DC
  Filled 2013-08-05: qty 1

## 2013-08-05 MED ORDER — PROPOFOL 10 MG/ML IV EMUL
5.0000 ug/kg/min | INTRAVENOUS | Status: DC
  Filled 2013-08-05: qty 100

## 2013-08-05 MED ORDER — PIPERACILLIN-TAZOBACTAM 3.375 G IVPB: 3.3750 g | Freq: Three times a day (TID) | INTRAVENOUS | Status: DC

## 2013-08-05 MED ORDER — PROPOFOL 10 MG/ML IV EMUL
5.0000 ug/kg/min | INTRAVENOUS | Status: DC
  Administered 2013-08-06: 15 ug/kg/min via INTRAVENOUS

## 2013-08-05 MED ORDER — IPRATROPIUM BROMIDE 0.02 % IN SOLN: 0.5000 mg | Freq: Four times a day (QID) | RESPIRATORY_TRACT | Status: DC | PRN

## 2013-08-05 MED ORDER — ATORVASTATIN CALCIUM 40 MG PO TABS
40.0000 mg | ORAL_TABLET | Freq: Every day | ORAL | Status: DC
  Administered 2013-08-05 – 2013-08-12 (×7): 40 mg via ORAL
  Filled 2013-08-05 (×8): qty 1

## 2013-08-05 MED ORDER — INSULIN ASPART 100 UNIT/ML ~~LOC~~ SOLN: 0.0000 [IU] | SUBCUTANEOUS | Status: DC

## 2013-08-05 MED ORDER — ONDANSETRON HCL 4 MG/2ML IJ SOLN: 4.0000 mg | Freq: Four times a day (QID) | INTRAMUSCULAR | Status: DC | PRN

## 2013-08-05 MED ORDER — PROPOFOL 10 MG/ML IV EMUL
INTRAVENOUS | Status: AC
  Filled 2013-08-05: qty 100

## 2013-08-05 MED ORDER — ONDANSETRON HCL 4 MG PO TABS: 4.0000 mg | ORAL_TABLET | Freq: Four times a day (QID) | ORAL | Status: DC | PRN

## 2013-08-05 MED ORDER — CHLORHEXIDINE GLUCONATE 0.12 % MT SOLN
15.0000 mL | Freq: Two times a day (BID) | OROMUCOSAL | Status: DC
  Administered 2013-08-05 – 2013-08-12 (×9): 15 mL via OROMUCOSAL
  Filled 2013-08-05 (×14): qty 15

## 2013-08-05 MED ORDER — METOPROLOL TARTRATE 1 MG/ML IV SOLN: 2.5000 mg | INTRAVENOUS | Status: DC | PRN

## 2013-08-05 MED ORDER — SODIUM CHLORIDE 0.9 % IV SOLN: 250.0000 mL | INTRAVENOUS | Status: DC | PRN

## 2013-08-05 MED ORDER — CHLORHEXIDINE GLUCONATE 0.12 % MT SOLN
OROMUCOSAL | Status: AC
  Filled 2013-08-05: qty 15

## 2013-08-05 MED ORDER — PIPERACILLIN-TAZOBACTAM 3.375 G IVPB
3.3750 g | Freq: Three times a day (TID) | INTRAVENOUS | Status: DC
  Administered 2013-08-05 – 2013-08-12 (×20): 3.375 g via INTRAVENOUS
  Filled 2013-08-05 (×26): qty 50

## 2013-08-05 MED ORDER — CLOPIDOGREL BISULFATE 75 MG PO TABS
75.0000 mg | ORAL_TABLET | Freq: Every day | ORAL | Status: DC
  Administered 2013-08-05 – 2013-08-12 (×7): 75 mg via ORAL
  Filled 2013-08-05 (×9): qty 1

## 2013-08-05 MED ORDER — SODIUM CHLORIDE 0.9 % IV SOLN: INTRAVENOUS | Status: DC

## 2013-08-05 MED ORDER — SUCCINYLCHOLINE CHLORIDE 20 MG/ML IJ SOLN
INTRAMUSCULAR | Status: DC | PRN
  Administered 2013-08-05: 100 mg via INTRAVENOUS

## 2013-08-05 MED ORDER — LORAZEPAM 2 MG/ML IJ SOLN
INTRAMUSCULAR | Status: AC
  Administered 2013-08-05: 1 mg
  Filled 2013-08-05: qty 1

## 2013-08-05 MED ORDER — HEPARIN SODIUM (PORCINE) 5000 UNIT/ML IJ SOLN
5000.0000 [IU] | Freq: Three times a day (TID) | INTRAMUSCULAR | Status: DC
  Administered 2013-08-05 – 2013-08-12 (×21): 5000 [IU] via SUBCUTANEOUS
  Filled 2013-08-05 (×24): qty 1

## 2013-08-05 MED ORDER — IOHEXOL 350 MG/ML SOLN
50.0000 mL | Freq: Once | INTRAVENOUS | Status: AC | PRN
  Administered 2013-08-05: 50 mL via INTRAVENOUS

## 2013-08-05 MED ORDER — FAMOTIDINE IN NACL 20-0.9 MG/50ML-% IV SOLN
20.0000 mg | INTRAVENOUS | Status: DC
  Administered 2013-08-06 – 2013-08-08 (×3): 20 mg via INTRAVENOUS
  Filled 2013-08-05 (×5): qty 50

## 2013-08-05 MED ORDER — VANCOMYCIN HCL IN DEXTROSE 1-5 GM/200ML-% IV SOLN
1000.0000 mg | Freq: Once | INTRAVENOUS | Status: AC
  Administered 2013-08-05: 1000 mg via INTRAVENOUS
  Filled 2013-08-05: qty 200

## 2013-08-05 MED ORDER — ASPIRIN 81 MG PO CHEW
324.0000 mg | CHEWABLE_TABLET | ORAL | Status: AC
  Administered 2013-08-05: 324 mg via ORAL
  Filled 2013-08-05: qty 4

## 2013-08-05 MED ORDER — SODIUM CHLORIDE 0.9 % IV SOLN
INTRAVENOUS | Status: DC
  Administered 2013-08-05: 18:00:00 via INTRAVENOUS

## 2013-08-05 MED ORDER — FAMOTIDINE IN NACL 20-0.9 MG/50ML-% IV SOLN: 20.0000 mg | Freq: Two times a day (BID) | INTRAVENOUS | Status: DC

## 2013-08-05 MED ORDER — ASPIRIN 300 MG RE SUPP: 300.0000 mg | RECTAL | Status: AC

## 2013-08-05 NOTE — Consult Note (Signed)
PULMONARY  / CRITICAL CARE MEDICINE  Name: [name] MRN: [id number] DOB: 01/07/1946    ADMISSION DATE:  08/05/2013 CONSULTATION DATE:  08/05/2013  REFERRING MD :  EDP PRIMARY SERVICE: PCCM  CHIEF COMPLAINT:  Acute respiratory failure  BRIEF PATIENT DESCRIPTION: 67 y/o presented to [facility] with altered mental status. Developed emesis.  Intubated for airway protection.  SIGNIFICANT EVENTS / STUDIES:  11/28  Altered mental status, emesis, intubated for airway protection 11/28  Head CT >>> Atrophy / small vessel ischemic changes, large old R hemispheric infarct but no acute abnormalities.     LINES / TUBES: OETT 11/28 >>> OGT 11/28 >>> Foley 11/28 >>>  CULTURES: 11/28  Blood >>> 11/28  Urine >>> 11/28  Sputum >>>  ANTIBIOTICS: Vanco 11/28 x 1 Zosyn 11/28 >>>  The patient is encephalopathic and unable to provide history, which was obtained for available medical records.  HISTORY OF PRESENT ILLNESS: 67 y/o M, smoker,  with PMH of HLD, HTN, ICM / systolic CHF s/p BiV ICD, bipolar disorder, CVA with residual L hemiparesis, and multiple sclerosis who presented to [facility] ER on 11/28 via EMS after being found down at home.  Last seen normal approx 2130 last PM.  Per friend, patient is able to move around without assistance and nml speech.  On arrival to ER, he was noted to be confused, somewhat combative with questionable new R sided weakness.   ER work up demonstrated negative ETOH & UDS.  Pt afebrile, lactate of 2.2, sr cr 0.79, WBC of 13.3, and negative UA.  CT head performed with noted atrophy / small vessel ischemic changes, large old R hemispheric infarct but no acute abnormalities.   Patient was later noted to have vomiting in the setting of AMS in ER.  Concern for aspiration and required intubation.  CXR post intubation reflects RML airspace disease.  PCCM called for ICU admit.   PAST MEDICAL HISTORY :  Past Medical History  Diagnosis Date  . Coronary artery disease     s/p  PCI of LAD with BMS 04/03/06 at [facility]  . Hyperlipidemia   . Hypertension   . Cerebrovascular accident     history of cerebrovascular accident with residual left hemiparesis in 2002, seen at [facility]  . Multiple sclerosis   . Ischemic cardiomyopathy     s/p BiV ICD implanted 2007 at [facility]  . Chronic systolic congestive heart failure   . Bipolar disorder    Past Surgical History  Procedure Laterality Date  . Tonsillectomy    . Vasectomy    . Implant of automatic cardioverter defibrillator      December 26, 2004 MDT BiV ICD implanted by Dr Chales Abrahams in [facility]  . Ptca      PCI LAD with BMS 04/03/06   Prior to Admission medications   Medication Sig Start Date End Date Taking? Authorizing Provider  aspirin 81 MG tablet Take 81 mg by mouth daily.      Historical Provider, MD  atorvastatin (LIPITOR) 40 MG tablet Take 40 mg by mouth daily.    Historical Provider, MD  benazepril (LOTENSIN) 40 MG tablet Take 1 tablet (40 mg total) by mouth daily. 12/24/12   Hillis Range, MD  carvedilol (COREG) 25 MG tablet Take 1 tablet (25 mg total) by mouth 2 (two) times daily with a meal. 08/20/11   Hillis Range, MD  clopidogrel (PLAVIX) 75 MG tablet Take 1 tablet (75 mg total) by mouth daily.  10/10/11   Hillis Range, MD  FLUoxetine (PROZAC) 20 MG tablet Take 1 tablet (20 mg total) by mouth daily. 08/21/11   Jacques Navy, MD  furosemide (LASIX) 20 MG tablet Take 1 tablet (20 mg total) by mouth daily. 08/20/11   Hillis Range, MD  nitroGLYCERIN (NITROSTAT) 0.4 MG SL tablet Place 0.4 mg under the tongue every 5 (five) minutes as needed.      Historical Provider, MD  spironolactone (ALDACTONE) 25 MG tablet Take 25 mg by mouth daily.      Historical Provider, MD  valproic acid (DEPAKENE) 250 MG capsule Take 250 mg by mouth 2 (two) times daily.      Historical Provider, MD   Allergies  Allergen Reactions  . Meperidine Hcl    FAMILY HISTORY:  Family History  Problem Relation  Age of Onset  . Heart disease Mother   . Kidney disease Father   . COPD Sister   . Cancer Neg Hx   . Diabetes Neg Hx    SOCIAL HISTORY:  reports that he quit smoking about 21 months ago. His smoking use included Cigarettes. He has a 20 pack-year smoking history. He has never used smokeless tobacco. He reports that he drinks alcohol. He reports that he does not use illicit drugs.  REVIEW OF SYSTEMS:  Unable to complete as pt is altered on vent.   SUBJECTIVE:   VITAL SIGNS: Temp:  [98.1 F (36.7 C)-98.5 F (36.9 C)] 98.1 F (36.7 C) (11/28 1116) Pulse Rate:  [85-102] 89 (11/28 1515) Resp:  [15-20] 16 (11/28 1515) BP: (104-172)/(82-114) 104/82 mmHg (11/28 1515) SpO2:  [94 %-100 %] 100 % (11/28 1515) FiO2 (%):  [100 %] 100 % (11/28 1430) Weight:  [134 lb 14.7 oz (61.2 kg)] 134 lb 14.7 oz (61.2 kg) (11/28 1515)  HEMODYNAMICS:    VENTILATOR SETTINGS: Vent Mode:  [-] PRVC FiO2 (%):  [100 %] 100 % Set Rate:  [16 bmp] 16 bmp Vt Set:  [520 mL] 520 mL PEEP:  [5 cmH20] 5 cmH20 Plateau Pressure:  [15 cmH20] 15 cmH20  INTAKE / OUTPUT: Intake/Output     11/27 0701 - 11/28 0700 11/28 0701 - 11/29 0700   Emesis/NG output  125   Total Output   125   Net   -125          PHYSICAL EXAMINATION: General:  Thin adult male in NAD on vent Neuro:  Sedate, no distress, pupils 2-2mm sluggish HEENT:  OETT, mm pink/dry, thick beard Cardiovascular:  s1s2 distant tones, SR with PVC's Lungs:  resp's even/non-labored, lungs bilaterally coarse R>L Abdomen:  Flat, soft, bsx4 active Musculoskeletal:  No acute deformities Skin:  Warm/dry, no edema  LABS:  CBC  Recent Labs Lab 08/05/13 1053 08/05/13 1110  WBC 13.8*  --   HGB 12.6* 13.3  HCT 37.2* 39.0  PLT 243  --    Coag's  Recent Labs Lab 08/05/13 1053  INR 1.10   BMET  Recent Labs Lab 08/05/13 1053 08/05/13 1110 08/05/13 1410  NA 137 139  --   K 4.0 4.0  --   CL 100 103  --   CO2 22  --   --   BUN 17 17  --    CREATININE 0.84 1.00 0.79  GLUCOSE 134* 138*  --    Electrolytes  Recent Labs Lab 08/05/13 1053  CALCIUM 8.8   Sepsis Markers  Recent Labs Lab 08/05/13 1054  LATICACIDVEN 2.2   ABG  Recent Labs Lab  08/05/13 1129  PHART 7.480*  PCO2ART 32.9*  PO2ART 74.0*   Liver Enzymes  Recent Labs Lab 08/05/13 1053  AST 19  ALT 9  ALKPHOS 84  BILITOT 0.4  ALBUMIN 2.7*   Cardiac Enzymes No results found for this basename: TROPONINI, PROBNP,  in the last 168 hours  Glucose No results found for this basename: GLUCAP,  in the last 168 hours  CXR: 11/28 >>> Hardware in good position, R lung airspace disease  ASSESSMENT / PLAN:  PULMONARY A:  Acute respiratory failure in setting of acute encephalopathy and inability to protect airway.  Possible aspiration pneumonia. P:   Goal pH>7.30, SpO2>92 Continuous mechanical support VAP bundle Daily WUA Trend ABG/CXR Albuterol / Atrovent PRN  CARDIOVASCULAR A: CAD. HTN. ICM s/p BiV pacer / ICD. P:  Hold Benazepril, Coreg ASA, Plavix Trend troponin / BNP Lopressor PRN TTE  RENAL A:  Hypovolemia. P:   Trend BMP Hold Lasix, Aldactone NS@75   GASTROINTESTINAL A:  Nausea / vomiting, etiology unclear. P:   NPO  NGT to Sx LFT, amylase, lipase KUB Pepcid for GI Px  HEMATOLOGIC A:  No active issues. P:  Trend CBC Heparin for DVT Px  INFECTIOUS A:  Possible aspiration pneumonia. P:   Abx / cx as above  ENDOCRINE A:  Hyperglycemia. P:   SSI sensitive scale   NEUROLOGIC A:  Acute encephalopathy, etiology unclear. Bipolar disorder. Multiple sclerosis by history (? Rx). Hx of CVA (noted old large R infarct on CT 11/28).  Questionable Hx of ETOH - negative ETOH & UDS on admit P:   Check Valproic acid level Hold prozac, depakene Neurology Consult  Thiamin  Propofol gtt  Canary Brim, NP-C [facility] Pgr: [phone] or [phone]  I have personally obtained history, examined patient,  evaluated and interpreted laboratory and imaging results, reviewed medical records, formulated assessment / plan and placed orders.  CRITICAL CARE:  The patient is critically ill with multiple organ systems failure and requires high complexity decision making for assessment and support, frequent evaluation and titration of therapies, application of advanced monitoring technologies and extensive interpretation of multiple databases. Critical Care Time devoted to patient care services described in this note is 45 minutes.   Lonia Farber, MD Pulmonary and Critical Care Medicine [facility] Pager: [phone]  08/05/2013, 4:55 PM

## 2013-08-05 NOTE — Progress Notes (Signed)
ANTIBIOTIC CONSULT NOTE - INITIAL  Pharmacy Consult for Zosyn Indication: Empiric coverage for possible aspiration or abdominal source  Allergies  Allergen Reactions  . Meperidine Hcl     Patient Measurements: Weight: 134 lb 14.7 oz (61.2 kg) (from 06/30/2012) Adjusted Body Weight: n/a  Vital Signs: Temp: 98.1 F (36.7 C) (11/28 1116) Temp src: Rectal (11/28 1116) BP: 104/82 mmHg (11/28 1515) Pulse Rate: 89 (11/28 1515) Intake/Output from previous day:   Intake/Output from this shift: Total I/O In: -  Out: 125 [Emesis/NG output:125]  Labs:  Recent Labs  08/05/13 1053 08/05/13 1110  WBC 13.8*  --   HGB 12.6* 13.3  PLT 243  --   CREATININE 0.84 1.00   The CrCl is unknown because both a height and weight (above a minimum accepted value) are required for this calculation. No results found for this basename: VANCOTROUGH, VANCOPEAK, VANCORANDOM, GENTTROUGH, GENTPEAK, GENTRANDOM, TOBRATROUGH, TOBRAPEAK, TOBRARND, AMIKACINPEAK, AMIKACINTROU, AMIKACIN,  in the last 72 hours   Microbiology: No results found for this or any previous visit (from the past 720 hour(s)).  Medical History: Past Medical History  Diagnosis Date  . Coronary artery disease     s/p PCI of LAD with BMS 04/03/06 at [facility]  . Hyperlipidemia   . Hypertension   . Cerebrovascular accident     history of cerebrovascular accident with residual left hemiparesis in 2002, seen at [facility]  . Multiple sclerosis   . Ischemic cardiomyopathy     s/p BiV ICD implanted 2007 at [facility]  . Chronic systolic congestive heart failure   . Bipolar disorder     Medications:  Scheduled:  . aspirin  324 mg Oral NOW   Or  . aspirin  300 mg Rectal NOW  . heparin  5,000 Units Subcutaneous Q8H  . heparin  5,000 Units Subcutaneous Q8H  . sodium chloride  3 mL Intravenous Q12H  . thiamine  100 mg Intravenous Daily   Assessment: 67 yo male admitted with AMS, vomiting and acute  respiratory failure.  Pharmacy asked to begin empiric Zosyn for possible aspiration or abdominal source of infection.  Received 1 dose of Zosyn at 1245 PM today in ED.  Pt's weight reported as 61.2 kg from Oct. 2013.  Will need to get more accurate weight once patient on unit.  Scr = 1, estimated CrCl ~ 70 ml/min  Goal of Therapy:  Resolution of infection  Plan:  1. Zosyn 3.375g IV q 8 hrs. Infuse over 4 hrs, extended interval infusion. 2. Will f/u patient weight. 3. F/u renal function, cultures, and clinical course.  Tad Moore, BCPS  Clinical Pharmacist Pager [phone]  08/05/2013 3:48 PM

## 2013-08-05 NOTE — ED Notes (Signed)
Patient found this am lying bedside couch where he normally sleeps, per EMS patient LSN at approx 2130 last night.  Information provided by friend staying at house,. Patient usually able to talk and move around without assistance per friend, patient speech garbled and patient unable to follows directions at this time, airway intact

## 2013-08-05 NOTE — Consult Note (Signed)
Neurology Consultation Reason for Consult: Altered mental status Referring Physician: Zubelevitskiy, K  CC: Altered mental status  History is obtained from: Referring physicians, chart  HPI: [name] is a 67 y.o. male with a history of a right MCA stroke who presents with altered mental status. He was last seen normal last night prior to going to bed. He arrived in the ER around 10 AM. He has a history of bipolar disorder as well as multiple sclerosis and takes Depakote for the bipolar disorder.  Today, he was unable to be aroused. In the ER he is unable to answer questions and chest x-ray revealed pneumonia versus atelectasis. CT of his brain was negative. In the emergency room he vomited and aspirated. He was intubated secondary to this event.   LKW: 11/27 prior to going to bed tpa given?: no, outside of window  ROS: Unable to assess secondary to patient's altered mental status.    Past Medical History  Diagnosis Date  . Coronary artery disease     s/p PCI of LAD with BMS 04/03/06 at [facility]  . Hyperlipidemia   . Hypertension   . Cerebrovascular accident     history of cerebrovascular accident with residual left hemiparesis in 2002, seen at [facility]  . Multiple sclerosis   . Ischemic cardiomyopathy     s/p BiV ICD implanted 2007 at [facility]  . Chronic systolic congestive heart failure   . Bipolar disorder     Family History: Unable to assess secondary to patient's altered mental status.    Social History: Tob: Unable to assess secondary to patient's altered mental status.    Exam: Current vital signs: BP 157/99  Pulse 89  Temp(Src) 97.8 F (36.6 C) (Oral)  Resp 15  Wt 61.2 kg (134 lb 14.7 oz)  SpO2 100% Vital signs in last 24 hours: Temp:  [97.8 F (36.6 C)-98.5 F (36.9 C)] 97.8 F (36.6 C) (11/28 1701) Pulse Rate:  [42-102] 89 (11/28 1900) Resp:  [15-25] 15 (11/28 1900) BP: (104-172)/(77-114) 157/99 mmHg (11/28  1900) SpO2:  [88 %-100 %] 100 % (11/28 1900) FiO2 (%):  [50 %-100 %] 50 % (11/28 1715) Weight:  [61.2 kg (134 lb 14.7 oz)] 61.2 kg (134 lb 14.7 oz) (11/28 1515)  General: In bed, intubated, slightly cachectic CV: Regular rate and rhythm Mental Status: Patient does not open eyes or follow commands.  Cranial Nerves: II: Does not blink to threat. Pupils are equal, round, and reactive to light.  Discs are difficult to visualize. III,IV, VI: No clear reaction to doll's eye, but eyes are conjugate. V/VII: Corneals are intact  VIII, X, XI, XII: Unable to assess secondary to patient's altered mental status.  Motor: Tone is normal. He does withdraw to noxious stimuli bilaterally and arms and legs Sensory: As above Deep Tendon Reflexes: 2+ and symmetric in the biceps and patellae.  Cerebellar: Unable to assess secondary to patient's altered mental status.  Gait: Unable to assess secondary to patient's altered mental status.     I have reviewed labs in epic and the results pertinent to this consultation are: Low albumin Normal ammonia Negative ethanol   I have reviewed the images obtained: CT head-negative for acute changes, old right MCA infarct  Impression: 67 year old male with coma of unclear etiology. No signs of meningismus or fever to suggest an infectious etiology. No signs of seizure activity. It is possible that he had an embolic shower, and a CT angiogram may help  to determine this. He is unfortunately unable to get an MRI do to pacemaker placement. Other possibility would be ingestion of unknown substance that is not picked up on typical drug screen.   Recommendations: 1) TSH, am cortisol 2) CTA head and neck to look for occlusion(at the posterior circulation or area affecting the contralateral MCA distribution) that could explain his current status. 3) EEG 4) will continue to follow   Ritta Slot, MD [facility] [phone]  If 7pm- 7am, please  page neurology on call at [phone].

## 2013-08-05 NOTE — H&P (Addendum)
[facility] History and Physical  [name] ZOX:[id number] DOB: 04-29-1946 DOA: 08/05/2013  Referring physician: Rosalia Hammers PCP: Illene Regulus, MD   Chief Complaint: unresponsive  HPI: [name] is a 67 y.o. male came to the emergency room via EMS when his roommate was unable to wake him. No one is currently available to give any history and patient is altered. According to EMS, patient was in his usual state of health last night. His roommate was unable to wake him up and called 911. Patient is unable to answer questions or follow commands. he reportedly has a history of previous stroke with left-sided hemiparesis. Also sketchy history of alcohol abuse, but unclear whether this is in remission. Workup in the emergency room is significant for right-sided pneumonia versus atelectasis, leukocytosis of 13,000. CT brain showing nothing acute. He has a history of biventricular ICD. He is afebrile and has normal vital signs.  Review of Systems: Unable as patient is unable to provide any history.  Past Medical History  Diagnosis Date  . Coronary artery disease     s/p PCI of LAD with BMS 04/03/06 at [facility]  . Hyperlipidemia   . Hypertension   . Cerebrovascular accident     history of cerebrovascular accident with residual left hemiparesis in 2002, seen at [facility]  . Multiple sclerosis   . Ischemic cardiomyopathy     s/p BiV ICD implanted 2007 at [facility]  . Chronic systolic congestive heart failure   . Bipolar disorder    Past Surgical History  Procedure Laterality Date  . Tonsillectomy    . Vasectomy    . Implant of automatic cardioverter defibrillator      December 26, 2004 MDT BiV ICD implanted by Dr Chales Abrahams in [facility]  . Ptca      PCI LAD with BMS 04/03/06   Social History: Unable. Reportedly has a roommate.  Allergies  Allergen Reactions  . Meperidine Hcl     Family History  Problem Relation Age of Onset  . Heart disease Mother   .  Kidney disease Father   . COPD Sister   . Cancer Neg Hx   . Diabetes Neg Hx    Prior to Admission medications   Medication Sig Start Date End Date Taking? Authorizing Provider  aspirin 81 MG tablet Take 81 mg by mouth daily.      Historical Provider, MD  atorvastatin (LIPITOR) 40 MG tablet Take 40 mg by mouth daily.    Historical Provider, MD  benazepril (LOTENSIN) 40 MG tablet Take 1 tablet (40 mg total) by mouth daily. 12/24/12   Hillis Range, MD  carvedilol (COREG) 25 MG tablet Take 1 tablet (25 mg total) by mouth 2 (two) times daily with a meal. 08/20/11   Hillis Range, MD  clopidogrel (PLAVIX) 75 MG tablet Take 1 tablet (75 mg total) by mouth daily. 10/10/11   Hillis Range, MD  FLUoxetine (PROZAC) 20 MG tablet Take 1 tablet (20 mg total) by mouth daily. 08/21/11   Jacques Navy, MD  furosemide (LASIX) 20 MG tablet Take 1 tablet (20 mg total) by mouth daily. 08/20/11   Hillis Range, MD  nitroGLYCERIN (NITROSTAT) 0.4 MG SL tablet Place 0.4 mg under the tongue every 5 (five) minutes as needed.      Historical Provider, MD  spironolactone (ALDACTONE) 25 MG tablet Take 25 mg by mouth daily.      Historical Provider, MD  valproic acid (DEPAKENE) 250 MG capsule  Take 250 mg by mouth 2 (two) times daily.      Historical Provider, MD   Physical Exam: Filed Vitals:   08/05/13 1300  BP: 136/101  Pulse: 97  Temp:   Resp: 18   BP 134/96  Pulse 85  Temp(Src) 98.1 F (36.7 C) (Rectal)  Resp 15  SpO2 99%  General Appearance:    Unkempt. Will not follow commands or answer questions.   Head:    Normocephalic, without obvious abnormality, atraumatic  Eyes:    PERRL, conjunctiva/corneas clear, EOM's intact, fundi    benign, both eyes          Nose:   Nares normal, septum midline, mucosa normal, no drainage   or sinus tenderness  Throat:   dry mucous membranes. No lip or tongue lacerations noted.   Neck:   Supple, symmetrical, trachea midline, no adenopathy;       thyroid:  No  enlargement/tenderness/nodules; no carotid   bruit or JVD  Back:     Symmetric, no curvature, ROM normal, no CVA tenderness  Lungs:     Clear to auscultation bilaterally, respirations unlabored  Chest wall:    No tenderness or deformity  Heart:    Regular rate and rhythm, S1 and S2 normal, no murmur, rub   or gallop  Abdomen:     Soft, non-tender, bowel sounds active all four quadrants,    no masses, no organomegaly  Genitalia:   deferred   Rectal:   deferred   Extremities:   Extremities normal, atraumatic, no cyanosis or edema  Pulses:   2+ and symmetric all extremities  Skin:   multiple healing abrasions and excoriations   Lymph nodes:   Cervical, supraclavicular, and axillary nodes normal  Neurologic:   no obvious cranial nerve deficits. Seems to be moving his left side spontaneously more so than the right.     Labs on Admission:  Basic Metabolic Panel:  Recent Labs Lab 08/05/13 1053 08/05/13 1110  NA 137 139  K 4.0 4.0  CL 100 103  CO2 22  --   GLUCOSE 134* 138*  BUN 17 17  CREATININE 0.84 1.00  CALCIUM 8.8  --    Liver Function Tests:  Recent Labs Lab 08/05/13 1053  AST 19  ALT 9  ALKPHOS 84  BILITOT 0.4  PROT 7.4  ALBUMIN 2.7*   No results found for this basename: LIPASE, AMYLASE,  in the last 168 hours  Recent Labs Lab 08/05/13 1053  AMMONIA 14   CBC:  Recent Labs Lab 08/05/13 1053 08/05/13 1110  WBC 13.8*  --   NEUTROABS 12.4*  --   HGB 12.6* 13.3  HCT 37.2* 39.0  MCV 97.9  --   PLT 243  --    Cardiac Enzymes: No results found for this basename: CKTOTAL, CKMB, CKMBINDEX, TROPONINI,  in the last 168 hours  BNP (last 3 results) No results found for this basename: PROBNP,  in the last 8760 hours CBG: No results found for this basename: GLUCAP,  in the last 168 hours  Radiological Exams on Admission: Dg Chest 1 View  08/05/2013   CLINICAL DATA:  Altered mental state.  Marland Kitchen  EXAM: CHEST - 1 VIEW  COMPARISON:  09/05/2010.  FINDINGS: Patient  is rotated to the right in a semi upright position. Atelectasis right lung base. Mild infiltrate cannot be excluded. No pleural effusion or pneumothorax. Heart size normal. Cardiac pacer noted. No acute osseous abnormality.  IMPRESSION: 1. Right lower lobe atelectatic  changes versus mild infiltrate. Suboptimal chest x-ray due to the patient's condition.  2.  Cardiac pacer.  No CHF.   Electronically Signed   By: Maisie Fus  Register   On: 08/05/2013 12:11   Ct Head Wo Contrast  08/05/2013   CLINICAL DATA:  Found lying beside couch, garbled speech, unable to follow directions, last seen normal at 2130 last night, history hypertension, hyperlipidemia, coronary artery disease, chronic systolic CHF, ischemic cardiomyopathy, stroke  EXAM: CT HEAD WITHOUT CONTRAST  TECHNIQUE: Contiguous axial images were obtained from the base of the skull through the vertex without intravenous contrast.  COMPARISON:  03/17/2010  FINDINGS: Generalized atrophy.  Normal ventricular morphology.  No midline shift or mass effect.  Small vessel chronic ischemic changes in deep cerebral white matter.  Old right right hemispheric infarct.  No intracranial hemorrhage, mass lesion, or evidence of acute infarction.  No extra-axial fluid collections.  Extensive atherosclerotic calcifications at skullbase.  Bones and sinuses unremarkable.  IMPRESSION: Atrophy with small vessel chronic ischemic changes in deep cerebral white matter.  Large old right hemispheric infarct.  No acute intracranial abnormalities.   Electronically Signed   By: Ulyses Southward M.D.   On: 08/05/2013 11:51    EKG: Sinus rhythm Ventricular premature complex Short PR interval Nonspecific IVCD with LAD Consider left ventricular hypertrophy Abnormal T, consider ischemia, lateral leads Artifact in lead(s) I II aVR aVL  Assessment/Plan Principal Problem:   Acute encephalopathy: etiology unclear. With mild leukocytosis and RLL infiltrate v. Atelectasis, will cover for possible  aspiration pneumonia/toxic encephalopathy.  Per chart, h/o CVA with left hemiparesis.  Seems to have right sided weakness. Could be new CVA. Unable to get MRI due to BiV ICD.  If no improvement, may get repeat CT brain in 24-48 hours.  Consider seizure/postictal. Will get EEG. On depakote. Ammonia ok.  SDU. Neuro checks.  NPO until alert Active Problems:   HYPERLIPIDEMIA   BIPOLAR AFFECTIVE DISORDER   Multiple sclerosis   HYPERTENSION   CORONARY ARTERY DISEASE   Chronic systolic dysfunction of left ventricle: last echo showed EF 25%. Iv KVO   History of stroke   Biventricular ICD (implantable cardioverter-defibrillator) in place Reported h/o alcohol abuse: IV thiamine  Code Status: unknown Family Communication: none available Disposition Plan: ?  Time spent: 60 min  [place] Sylvan L [facility] Pager [phone]  If 7PM-7AM, please contact night-coverage [email or web address] Password [email or web address] 08/05/2013, 2:21 PM

## 2013-08-05 NOTE — ED Notes (Signed)
Patient starting to have decreased airway protection and vomiting, patient rolled to side and suctioned, Dr. Rosalia Hammers notified about potential loss of airway, patient to be intubated at this time

## 2013-08-05 NOTE — ED Provider Notes (Addendum)
CSN: [id number]     Arrival date & time 08/05/13  1003 History   First MD Initiated Contact with Patient 08/05/13 1011    Level V caveat altered mental status Chief Complaint  Patient presents with  . Altered Mental Status   (Consider location/radiation/quality/duration/timing/severity/associated sxs/prior Treatment) HPI This is a 67 year old male who is brought in via [facility]. He is confused and somewhat combative and unable to give any history. History is obtained completely from EMS on my initial valuation EMS states they were called to his home. They state they were given history by a friend who also lives in the home. They were told by her that he was last seen normal last evening around 9 PM. This morning he was found on on the floor in from the sofa. He was confused and EMS was called secondary to this. They report that he has some right-sided weakness but his friend had stated that he had some residual weakness on the side from a stroke although it appeared that it may be weaker than usual. Past Medical History  Diagnosis Date  . Coronary artery disease     s/p PCI of LAD with BMS 04/03/06 at [facility]  . Hyperlipidemia   . Hypertension   . Cerebrovascular accident     history of cerebrovascular accident with residual left hemiparesis in 2002, seen at [facility]  . Multiple sclerosis   . Ischemic cardiomyopathy     s/p BiV ICD implanted 2007 at [facility]  . Chronic systolic congestive heart failure   . Bipolar disorder    Past Surgical History  Procedure Laterality Date  . Tonsillectomy    . Vasectomy    . Implant of automatic cardioverter defibrillator      December 26, 2004 MDT BiV ICD implanted by Dr Chales Abrahams in [facility]  . Ptca      PCI LAD with BMS 04/03/06   Family History  Problem Relation Age of Onset  . Heart disease Mother   . Kidney disease Father   . COPD Sister   . Cancer Neg Hx   . Diabetes Neg Hx    History   Substance Use Topics  . Smoking status: Former Smoker -- 0.50 packs/day for 40 years    Types: Cigarettes    Quit date: 10/13/2011  . Smokeless tobacco: Never Used     Comment: has tried to quit "many times" will continue to try to quit  . Alcohol Use: Yes     Comment: remote heavy ETOH, "years ago"    Review of Systems  Unable to perform ROS   Allergies  Meperidine hcl  Home Medications   Current Outpatient Rx  Name  Route  Sig  Dispense  Refill  . aspirin 81 MG tablet   Oral   Take 81 mg by mouth daily.           Marland Kitchen atorvastatin (LIPITOR) 40 MG tablet   Oral   Take 40 mg by mouth daily.         . benazepril (LOTENSIN) 40 MG tablet   Oral   Take 1 tablet (40 mg total) by mouth daily.   30 tablet   1     Patient needs an office visit before any other ref ...   . carvedilol (COREG) 25 MG tablet   Oral   Take 1 tablet (25 mg total) by mouth 2 (two) times daily with a meal.   60 tablet  6   . clopidogrel (PLAVIX) 75 MG tablet   Oral   Take 1 tablet (75 mg total) by mouth daily.   30 tablet   6   . FLUoxetine (PROZAC) 20 MG tablet   Oral   Take 1 tablet (20 mg total) by mouth daily.   30 tablet   11   . furosemide (LASIX) 20 MG tablet   Oral   Take 1 tablet (20 mg total) by mouth daily.   30 tablet   6   . nitroGLYCERIN (NITROSTAT) 0.4 MG SL tablet   Sublingual   Place 0.4 mg under the tongue every 5 (five) minutes as needed.           Marland Kitchen spironolactone (ALDACTONE) 25 MG tablet   Oral   Take 25 mg by mouth daily.           Marland Kitchen valproic acid (DEPAKENE) 250 MG capsule   Oral   Take 250 mg by mouth 2 (two) times daily.            BP 143/106  Pulse 89  Temp(Src) 98.5 F (36.9 C) (Oral)  Resp 18  SpO2 94% Physical Exam  Nursing note and vitals reviewed. Constitutional: He appears well-developed.  On Male who is combative and agitated but does not appear to be in any acute respiratory distress  HENT:  Head: Normocephalic and  atraumatic.  Large amount of matted hair and facial hair preclude full visual inspection but no obvious signs of trauma are noted Patient is not cooperative with exam of oropharynx  Neck: Normal range of motion.  Cardiovascular: Normal rate.   Pulmonary/Chest: Effort normal.  Abdominal: Soft. Bowel sounds are normal. He exhibits no distension. There is no tenderness.  Musculoskeletal:  Mild erythematous area at pressure points on right side with early skin break down right lateral ankle.   Neurological: He is unresponsive. GCS eye subscore is 4. GCS verbal subscore is 2. GCS motor subscore is 5.  Reflex Scores:      Bicep reflexes are 2+ on the right side and 1+ on the left side.      Patellar reflexes are 3+ on the right side and 2+ on the left side. Patient unable to follow commands.  Appears to  Have decreased strength right side vs left but has tone decreased from left..      ED Course  INTUBATION Date/Time: 08/05/2013 2:37 PM Performed by: Hilario Quarry Authorized by: Hilario Quarry Consent: The procedure was performed in an emergent situation. Patient identity confirmed: arm band Time out: Immediately prior to procedure a "time out" was called to verify the correct patient, procedure, equipment, support staff and site/side marked as required. Indications: airway protection Intubation method: fiberoptic oral Patient status: paralyzed (RSI) Preoxygenation: nonrebreather mask Sedatives: etomidate Paralytic: succinylcholine Tube size: 8.0 mm Tube type: cuffed Number of attempts: 1 Cricoid pressure: no Cords visualized: yes Post-procedure assessment: chest rise,  ETCO2 monitor and CO2 detector Breath sounds: equal and absent over the epigastrium Cuff inflated: yes ETT to lip: 20 cm Tube secured with: ETT holder Patient tolerance: Patient tolerated the procedure well with no immediate complications.   (including critical care time) Labs Review Labs Reviewed  CULTURE,  BLOOD (ROUTINE X 2)  CULTURE, BLOOD (ROUTINE X 2)  URINE CULTURE  CBC WITH DIFFERENTIAL  AMMONIA  BLOOD GAS, ARTERIAL  COMPREHENSIVE METABOLIC PANEL  URINE RAPID DRUG SCREEN (HOSP PERFORMED)  ETHANOL  LACTIC ACID, PLASMA  URINALYSIS, ROUTINE W REFLEX  MICROSCOPIC  PROTIME-INR   Imaging Review No results found.  EKG Interpretation    Date/Time:  Friday August 05 2013 11:32:39 EST Ventricular Rate:  87 PR Interval:  89 QRS Duration: 160 QT Interval:  465 QTC Calculation: 559 R Axis:   -102 Text Interpretation:  Sinus rhythm Ventricular premature complex Short PR interval Nonspecific IVCD with LAD Consider left ventricular hypertrophy Abnormal T, consider ischemia, lateral leads Artifact in lead(s) I II aVR aVL Confirmed by Ariatna Jester MD, Vee Bahe ([id number]) on 08/05/2013 11:56:38 AM           Results for orders placed during the hospital encounter of 08/05/13  CBC WITH DIFFERENTIAL      Result Value Range   WBC 13.8 (*) 4.0 - 10.5 K/uL   RBC 3.80 (*) 4.22 - 5.81 MIL/uL   Hemoglobin 12.6 (*) 13.0 - 17.0 g/dL   HCT 16.1 (*) 09.6 - 04.5 %   MCV 97.9  78.0 - 100.0 fL   MCH 33.2  26.0 - 34.0 pg   MCHC 33.9  30.0 - 36.0 g/dL   RDW 40.9  81.1 - 91.4 %   Platelets 243  150 - 400 K/uL   Neutrophils Relative % 90 (*) 43 - 77 %   Neutro Abs 12.4 (*) 1.7 - 7.7 K/uL   Lymphocytes Relative 5 (*) 12 - 46 %   Lymphs Abs 0.7  0.7 - 4.0 K/uL   Monocytes Relative 5  3 - 12 %   Monocytes Absolute 0.7  0.1 - 1.0 K/uL   Eosinophils Relative 0  0 - 5 %   Eosinophils Absolute 0.0  0.0 - 0.7 K/uL   Basophils Relative 0  0 - 1 %   Basophils Absolute 0.0  0.0 - 0.1 K/uL  AMMONIA      Result Value Range   Ammonia 14  11 - 60 umol/L  COMPREHENSIVE METABOLIC PANEL      Result Value Range   Sodium 137  135 - 145 mEq/L   Potassium 4.0  3.5 - 5.1 mEq/L   Chloride 100  96 - 112 mEq/L   CO2 22  19 - 32 mEq/L   Glucose, Bld 134 (*) 70 - 99 mg/dL   BUN 17  6 - 23 mg/dL   Creatinine, Ser 7.82  0.50  - 1.35 mg/dL   Calcium 8.8  8.4 - 95.6 mg/dL   Total Protein 7.4  6.0 - 8.3 g/dL   Albumin 2.7 (*) 3.5 - 5.2 g/dL   AST 19  0 - 37 U/L   ALT 9  0 - 53 U/L   Alkaline Phosphatase 84  39 - 117 U/L   Total Bilirubin 0.4  0.3 - 1.2 mg/dL   GFR calc non Af Amer 89 (*) >90 mL/min   GFR calc Af Amer >90  >90 mL/min  URINE RAPID DRUG SCREEN (HOSP PERFORMED)      Result Value Range   Opiates NONE DETECTED  NONE DETECTED   Cocaine NONE DETECTED  NONE DETECTED   Benzodiazepines NONE DETECTED  NONE DETECTED   Amphetamines NONE DETECTED  NONE DETECTED   Tetrahydrocannabinol NONE DETECTED  NONE DETECTED   Barbiturates NONE DETECTED  NONE DETECTED  ETHANOL      Result Value Range   Alcohol, Ethyl (B) <11  0 - 11 mg/dL  LACTIC ACID, PLASMA      Result Value Range   Lactic Acid, Venous 2.2  0.5 - 2.2 mmol/L  URINALYSIS, ROUTINE W REFLEX MICROSCOPIC  Result Value Range   Color, Urine YELLOW  YELLOW   APPearance CLEAR  CLEAR   Specific Gravity, Urine 1.022  1.005 - 1.030   pH 6.5  5.0 - 8.0   Glucose, UA NEGATIVE  NEGATIVE mg/dL   Hgb urine dipstick NEGATIVE  NEGATIVE   Bilirubin Urine NEGATIVE  NEGATIVE   Ketones, ur NEGATIVE  NEGATIVE mg/dL   Protein, ur 30 (*) NEGATIVE mg/dL   Urobilinogen, UA 1.0  0.0 - 1.0 mg/dL   Nitrite NEGATIVE  NEGATIVE   Leukocytes, UA NEGATIVE  NEGATIVE  PROTIME-INR      Result Value Range   Prothrombin Time 14.0  11.6 - 15.2 seconds   INR 1.10  0.00 - 1.49  URINE MICROSCOPIC-ADD ON      Result Value Range   WBC, UA 0-2  <3 WBC/hpf   RBC / HPF 0-2  <3 RBC/hpf  POCT I-STAT, CHEM 8      Result Value Range   Sodium 139  135 - 145 mEq/L   Potassium 4.0  3.5 - 5.1 mEq/L   Chloride 103  96 - 112 mEq/L   BUN 17  6 - 23 mg/dL   Creatinine, Ser 5.62  0.50 - 1.35 mg/dL   Glucose, Bld 130 (*) 70 - 99 mg/dL   Calcium, Ion 8.65  7.84 - 1.30 mmol/L   TCO2 24  0 - 100 mmol/L   Hemoglobin 13.3  13.0 - 17.0 g/dL   HCT 69.6  29.5 - 28.4 %  POCT I-STAT 3, BLOOD GAS  (G3+)      Result Value Range   pH, Arterial 7.480 (*) 7.350 - 7.450   pCO2 arterial 32.9 (*) 35.0 - 45.0 mmHg   pO2, Arterial 74.0 (*) 80.0 - 100.0 mmHg   Bicarbonate 24.5 (*) 20.0 - 24.0 mEq/L   TCO2 25  0 - 100 mmol/L   O2 Saturation 96.0     Acid-Base Excess 1.0  0.0 - 2.0 mmol/L   Collection site RADIAL, ALLEN'S TEST ACCEPTABLE     Drawn by RT     Sample type ARTERIAL      MDM  No diagnosis found.   32 rolled male with a history of hypertension, CVA, and CAD status post implantable defibrillator who presents today with altered mental status. Last known well was last night approximately 9 PM. He does have lateralized weakness has had a previous stroke it is unknown whether this is new or old. Patient also has a history of alcohol abuse in the past but alcohol level is negative as is urine drug screen. He is not febrile here but has a slightly elevated white blood cell count here with a appears to be clear and this does not appear to be hypoxic in nature as he is oxygenating well on room air with a pH of 7.48 PCO2 of 32 and PO2 of 74. Blood pressure has been adequate and I doubt that this is due to any hypotensive-type state. Metabolically he appears to be intact with no major abnormalities of electrolytes noted. Blood sugar has been normal here. Given the elevated white blood cell count I can not rule out sepsis as a cause and he has subsequently received antibiotics. His first lactic acid is borderline and will require followup. He has not been tachycardic or hypotensive Head CT is clear and shows no evidence of bleeding the patient will require MRI to rule out CVA. Patient is seen primarily by Dr. Deeann Dowse with [place] [facility]. Hospitalists  have been paged for admission  Discussed with Dr. Lendell Caprice and plan admission to tele and  Ck added.  I also discussed the possibility of alcohol withdrawal and seizures with Dr. Lendell Caprice. Patient does appear to have some distant history of alcohol  abuse but does not have any elevated alcohol level currently, smell of alcohol, or have any reported recent history of alcohol abuse. Ativan did sedate the patient some but does not appear to have changed his mental status significantly.   CRITICAL CARE Performed by: Hilario Quarry Total critical care time: 45 Critical care time was exclusive of separately billable procedures and treating other patients. Critical care was necessary to treat or prevent imminent or life-threatening deterioration. Critical care was time spent personally by me on the following activities: development of treatment plan with patient and/or surrogate as well as nursing, discussions with consultants, evaluation of patient's response to treatment, examination of patient, obtaining history from patient or surrogate, ordering and performing treatments and interventions, ordering and review of laboratory studies, ordering and review of radiographic studies, pulse oximetry and re-evaluation of patient's condition.  Hilario Quarry, MD 08/05/13 1304  Hilario Quarry, MD 08/05/13 1308  Patient with decreasing level of consciousness and vomited without good gag reflex. Therefore, decision was made to intubate the patient. Please see procedure note.   I spoke with Dr. Herma Carson. on for intensivist and they will see and admit patient to  Hilario Quarry, MD 08/05/13 1438

## 2013-08-06 ENCOUNTER — Inpatient Hospital Stay (HOSPITAL_COMMUNITY): Payer: Medicare Other

## 2013-08-06 LAB — BLOOD GAS, ARTERIAL
Drawn by: 34761
MECHVT: 520 mL
O2 Saturation: 97.2 %
PEEP: 5 cmH2O
RATE: 14 resp/min
pCO2 arterial: 36.8 mmHg (ref 35.0–45.0)
pO2, Arterial: 95.6 mmHg (ref 80.0–100.0)

## 2013-08-06 LAB — URINE CULTURE
Colony Count: NO GROWTH
Special Requests: NORMAL

## 2013-08-06 LAB — BASIC METABOLIC PANEL
CO2: 24 mEq/L (ref 19–32)
Calcium: 8.5 mg/dL (ref 8.4–10.5)
Creatinine, Ser: 0.8 mg/dL (ref 0.50–1.35)
GFR calc non Af Amer: >90 mL/min (ref >90)
Glucose, Bld: 102 mg/dL — ABNORMAL HIGH (ref 70–99)
Potassium: 3.8 mEq/L (ref 3.5–5.1)

## 2013-08-06 LAB — TROPONIN I: Troponin I: <0.3 ng/mL (ref <0.30)

## 2013-08-06 LAB — GLUCOSE, CAPILLARY
Glucose-Capillary: 68 mg/dL — ABNORMAL LOW (ref 70–99)
Glucose-Capillary: 92 mg/dL (ref 70–99)

## 2013-08-06 LAB — CBC WITH DIFFERENTIAL/PLATELET
Basophils Absolute: 0 10*3/uL (ref 0.0–0.1)
Eosinophils Absolute: 0 10*3/uL (ref 0.0–0.7)
Eosinophils Relative: 0 % (ref 0–5)
HCT: 36.5 % — ABNORMAL LOW (ref 39.0–52.0)
Lymphocytes Relative: 12 % (ref 12–46)
Lymphs Abs: 1.5 10*3/uL (ref 0.7–4.0)
MCH: 33.1 pg (ref 26.0–34.0)
MCHC: 33.2 g/dL (ref 30.0–36.0)
MCV: 99.7 fL (ref 78.0–100.0)
Monocytes Absolute: 0.8 10*3/uL (ref 0.1–1.0)
Neutrophils Relative %: 81 % — ABNORMAL HIGH (ref 43–77)
RDW: 13 % (ref 11.5–15.5)
WBC: 12.6 10*3/uL — ABNORMAL HIGH (ref 4.0–10.5)

## 2013-08-06 LAB — MAGNESIUM: Magnesium: 1.9 mg/dL (ref 1.5–2.5)

## 2013-08-06 LAB — PHOSPHORUS: Phosphorus: 3.5 mg/dL (ref 2.3–4.6)

## 2013-08-06 LAB — PRO B NATRIURETIC PEPTIDE: Pro B Natriuretic peptide (BNP): 11186 pg/mL — ABNORMAL HIGH (ref 0–125)

## 2013-08-06 LAB — CORTISOL-AM, BLOOD: Cortisol - AM: 17.8 ug/dL (ref 4.3–22.4)

## 2013-08-06 MED ORDER — SODIUM CHLORIDE 0.9 % IV SOLN
INTRAVENOUS | Status: DC | PRN
  Administered 2013-08-06: 20 mL/h via INTRAVENOUS

## 2013-08-06 MED ORDER — DEXTROSE 50 % IV SOLN
INTRAVENOUS | Status: AC
  Filled 2013-08-06: qty 50

## 2013-08-06 MED ORDER — DEXTROSE 50 % IV SOLN
25.0000 mL | Freq: Once | INTRAVENOUS | Status: AC | PRN
  Administered 2013-08-06: 25 mL via INTRAVENOUS

## 2013-08-06 MED ORDER — PROPOFOL 10 MG/ML IV EMUL
5.0000 ug/kg/min | INTRAVENOUS | Status: DC
  Administered 2013-08-06: 14 ug/kg/min via INTRAVENOUS

## 2013-08-06 NOTE — Progress Notes (Signed)
eLink Physician-Brief Progress Note Patient Name: RAVON MORTELLARO DOB: 1946-05-03 MRN: [id number]  Date of Service  08/06/2013   HPI/Events of Note   Hypotension. RN feels likely 2/2 propofol.  eICU Interventions   Cycle BP, if no improvement will bolus.    Intervention Category Major Interventions: Hypotension - evaluation and management  Lydiah Pong R. 08/06/2013, 3:42 AM

## 2013-08-06 NOTE — Progress Notes (Addendum)
Subjective: Much more awake today.   Exam: Filed Vitals:   08/06/13 1000  BP: 130/84  Pulse: 76  Temp:   Resp: 12   Gen: In bed, NAD MS: Awake spontaneously, follows some simple commands, though not more complex ones.  UX:LKGMW nods when asked if he can see, though does not clearly blink to threat, blinks to eyelid sitmulation.  Motor: Moves all extremities spontaneously, though I do wonder about a mild right hemiparesis.  Sensory:responds in all 4 ext  CTA - no clear large vessel occlusions, but does have severe R ICA stensosis  Impression: 67 year old male with AMS of unclear etiology. No signs of meningismus or fever to suggest an infectious etiology. No signs of seizure activity. It is possible that he had an embolic shower. Other possibility would be ingestion of unknown substance that is not picked up on typical drug screen. Infectious causes felt to be less likely. I am encouraged by the improvement that he has had overnight.   He does appear malnourished and therefore I ordered high dose thiamine.  will need vascular surgery evaluation eventually for carotid stenosis if he improves to the point of being able to be a surgery candidate.    Recommendations: 1) EEG pending 2) Continue thiamine 3) plan repeat CT tomorrow  Ritta Slot, MD [facility] [phone]  If 7pm- 7am, please page neurology on call at [phone].

## 2013-08-06 NOTE — Progress Notes (Signed)
Utilization review completed. Hajime Asfaw, RN, BSN. 

## 2013-08-06 NOTE — Progress Notes (Signed)
SLP Cancellation Note  Patient Details Name: [name] MRN: [id number] DOB: 12-24-1945   Cancelled treatment:       Reason Eval/Treat Not Completed: Medical issues which prohibited therapy. Per discussion with RN, plans are to extubate this afternoon. Will f/u as able.  Maxcine Ham, M.A. CCC-SLP [phone]    Maxcine Ham 08/06/2013, 1:41 PM

## 2013-08-06 NOTE — Progress Notes (Signed)
EEG Completed; Results Pending  

## 2013-08-06 NOTE — Progress Notes (Signed)
Pt had a mild hypoglycemic event with his scheduled 16:00 CBG result being 68 taken at 15:38. Pt was treated with 25 ml of D50 IV per the hypoglycemic protocol for NPO patients. Follow-up CBG was 92 at 17:50. No further action needed at this time.

## 2013-08-06 NOTE — Procedures (Signed)
Extubation Procedure Note  Patient Details:   Name: [name] DOB: 1946/04/05 MRN: [id number]   Airway Documentation:  Airway 8 mm (Active)  Secured at (cm) 25 cm 08/06/2013  8:56 AM  Measured From Lips 08/06/2013  8:56 AM  Secured Location Center 08/06/2013  8:56 AM  Secured By [employer] 08/06/2013  8:56 AM  Tube Holder Repositioned Yes 08/06/2013  8:56 AM  Cuff Pressure (cm H2O) 24 cm H2O 08/06/2013  3:29 AM  Site Condition Dry 08/06/2013  8:56 AM    Evaluation  O2 sats: stable throughout Complications: No apparent complications Patient did tolerate procedure well. Bilateral Breath Sounds: Clear;Diminished Suctioning: Airway   Pt is stable, unable to speak words, can make noise but not phonate appropriately.  Devra Dopp D 08/06/2013, 2:30 PM

## 2013-08-06 NOTE — Progress Notes (Signed)
PULMONARY  / CRITICAL CARE MEDICINE  Name: [name] MRN: [id number] DOB: 28-Jan-1946    ADMISSION DATE:  08/05/2013 CONSULTATION DATE:  08/05/2013  REFERRING MD :  EDP PRIMARY SERVICE: PCCM  CHIEF COMPLAINT:  Acute respiratory failure  BRIEF PATIENT DESCRIPTION: 67 y/o presented to [facility] with altered mental status. Developed emesis.  Intubated for airway protection.  SIGNIFICANT EVENTS / STUDIES:  11/28  Altered mental status, emesis, intubated for airway protection 11/28  Head CT >>> Atrophy / small vessel ischemic changes, large old R hemispheric infarct but no acute abnormalities.     LINES / TUBES: OETT 11/28 >>>11/29 OGT 11/28 >>> Foley 11/28 >>>  CULTURES: 11/28  Blood >>> 11/28  Urine >>> 11/28  Sputum >>>  ANTIBIOTICS: Vanco 11/28 x 1 Zosyn 11/28 >>>  SUBJECTIVE: Awake and follows command.  VITAL SIGNS: Temp:  [97.8 F (36.6 C)-99.4 F (37.4 C)] 99 F (37.2 C) (11/29 0802) Pulse Rate:  [40-102] 73 (11/29 0856) Resp:  [13-25] 13 (11/29 0900) BP: (73-172)/(61-114) 130/92 mmHg (11/29 0900) SpO2:  [88 %-100 %] 100 % (11/29 0856) FiO2 (%):  [40 %-100 %] 40 % (11/29 0856) Weight:  [50.1 kg (110 lb 7.2 oz)-61.2 kg (134 lb 14.7 oz)] 54.8 kg (120 lb 13 oz) (11/29 0500)  HEMODYNAMICS:    VENTILATOR SETTINGS: Vent Mode:  [-] CPAP;PSV FiO2 (%):  [40 %-100 %] 40 % Set Rate:  [14 bmp-16 bmp] 14 bmp Vt Set:  [520 mL] 520 mL PEEP:  [5 cmH20] 5 cmH20 Pressure Support:  [5 cmH20] 5 cmH20 Plateau Pressure:  [13 cmH20-16 cmH20] 13 cmH20  INTAKE / OUTPUT: Intake/Output     11/28 0701 - 11/29 0700 11/29 0701 - 11/30 0700   I.V. (mL/kg) 1050.2 (19.2) 233.1 (4.3)   NG/GT 80    IV Piggyback 112.5 75   Total Intake(mL/kg) 1242.7 (22.7) 308.1 (5.6)   Urine (mL/kg/hr) 210 102 (0.5)   Emesis/NG output 400    Total Output 610 102   Net +632.7 +206.1         PHYSICAL EXAMINATION: General:  Thin adult male in NAD on vent Neuro:  Arousable and follows command. HEENT:   OETT, mm pink/dry, thick beard Cardiovascular:  s1s2 distant tones, SR with PVC's Lungs:  resp's even/non-labored, lungs bilaterally coarse R>L Abdomen:  Flat, soft, bsx4 active Musculoskeletal:  No acute deformities Skin:  Warm/dry, no edema  LABS:  CBC  Recent Labs Lab 08/05/13 1053 08/05/13 1110 08/06/13 0310  WBC 13.8*  --  12.6*  HGB 12.6* 13.3 12.1*  HCT 37.2* 39.0 36.5*  PLT 243  --  215   Coag's  Recent Labs Lab 08/05/13 1053  INR 1.10   BMET  Recent Labs Lab 08/05/13 1053 08/05/13 1110 08/05/13 1410 08/06/13 0310  NA 137 139  --  136  K 4.0 4.0  --  3.8  CL 100 103  --  101  CO2 22  --   --  24  BUN 17 17  --  20  CREATININE 0.84 1.00 0.79 0.80  GLUCOSE 134* 138*  --  102*   Electrolytes  Recent Labs Lab 08/05/13 1053 08/06/13 0310  CALCIUM 8.8 8.5  MG  --  1.9  PHOS  --  3.5   Sepsis Markers  Recent Labs Lab 08/05/13 1054 08/05/13 1627 08/06/13 0310  LATICACIDVEN 2.2  --   --   PROCALCITON  --  <0.10 0.13   ABG  Recent Labs Lab 08/05/13 1129 08/06/13 0428  PHART 7.480* 7.432  PCO2ART 32.9* 36.8  PO2ART 74.0* 95.6   Liver Enzymes  Recent Labs Lab 08/05/13 1053 08/05/13 1645  AST 19 24  ALT 9 9  ALKPHOS 84 84  BILITOT 0.4 0.8  ALBUMIN 2.7* 2.8*   Cardiac Enzymes  Recent Labs Lab 08/05/13 1627 08/05/13 2235 08/06/13 0310  TROPONINI <0.30 <0.30 <0.30  PROBNP 16611.0*  --  11186.0*   Glucose  Recent Labs Lab 08/05/13 1701 08/05/13 2014 08/06/13 0048 08/06/13 0406 08/06/13 0707  GLUCAP 119* 104* 99 95 89   CXR: 11/28 >>> Hardware in good position, R lung airspace disease  ASSESSMENT / PLAN:  PULMONARY A:  Acute respiratory failure in setting of acute encephalopathy and inability to protect airway.  Possible aspiration pneumonia. P:   - Extubate after EEG. - O2 as needed. - Early mobilization. - Albuterol / Atrovent PRN.  CARDIOVASCULAR A: CAD. HTN. ICM s/p BiV pacer / ICD. P:  - Hold  Benazepril, Coreg. - ASA, Plavix. - Trend troponin / BNP. - Lopressor PRN. - TTE pending.  RENAL A:  Hypovolemia. P:   - Trend BMP. - Hold Lasix, Aldactone. - KVO IVF.  GASTROINTESTINAL A:  Nausea / vomiting, etiology unclear. P:   - Swallow evaluation then diet. - D/C NGT. - Pepcid for GI Px.  HEMATOLOGIC A:  No active issues. P:  - Trend CBC. - Heparin for DVT Px.  INFECTIOUS A:  Possible aspiration pneumonia. P:   - Abx / cx as above.  ENDOCRINE A:  Hyperglycemia. P:   - SSI sensitive scale.  NEUROLOGIC A:  Acute encephalopathy, etiology unclear. Bipolar disorder. Multiple sclerosis by history (? Rx). Hx of CVA (noted old large R infarct on CT 11/28).  Questionable Hx of ETOH - negative ETOH & UDS on admit P:   - Check Valproic acid level. - Hold prozac, depakene. - Neurology Consult appreciated. - Thiamin/folate. - D/C sedation.  I have personally obtained history, examined patient, evaluated and interpreted laboratory and imaging results, reviewed medical records, formulated assessment / plan and placed orders.  CRITICAL CARE:  The patient is critically ill with multiple organ systems failure and requires high complexity decision making for assessment and support, frequent evaluation and titration of therapies, application of advanced monitoring technologies and extensive interpretation of multiple databases. Critical Care Time devoted to patient care services described in this note is 35 minutes.   Alyson Reedy, M.D. [facility] Pulmonary/Critical Care Medicine. Pager: [phone]. After hours pager: [phone].

## 2013-08-06 NOTE — Procedures (Signed)
History: 67 year old male with altered mental status  Background: The background consists of intermixed alpha and beta activities the There is a well defined posterior dominant rhythm of 8 Hz that attenuates with eye opening. This is much better visualized on the left side than the right. No sleep was recorded  Photic stimulation: Physiologic driving is not performed  EEG Abnormalities: 1) asymmetric PDR  Clinical Interpretation: This EEG is consistent with a right occipital lobe dysfunction likely secondary to patient's previous stroke. There was no seizure or seizure predisposition recorded on this study.   Ritta Slot, MD [facility] [phone]  If 7pm- 7am, please page neurology on call at [phone].

## 2013-08-07 ENCOUNTER — Inpatient Hospital Stay (HOSPITAL_COMMUNITY): Payer: Medicare Other

## 2013-08-07 ENCOUNTER — Encounter (HOSPITAL_COMMUNITY): Payer: Self-pay | Admitting: *Deleted

## 2013-08-07 DIAGNOSIS — I369 Nonrheumatic tricuspid valve disorder, unspecified: Secondary | ICD-10-CM

## 2013-08-07 LAB — CBC
HCT: 32.5 % — ABNORMAL LOW (ref 39.0–52.0)
Hemoglobin: 11 g/dL — ABNORMAL LOW (ref 13.0–17.0)
Platelets: 186 10*3/uL (ref 150–400)
RBC: 3.3 MIL/uL — ABNORMAL LOW (ref 4.22–5.81)

## 2013-08-07 LAB — BASIC METABOLIC PANEL
CO2: 22 mEq/L (ref 19–32)
Calcium: 8.3 mg/dL — ABNORMAL LOW (ref 8.4–10.5)
Chloride: 104 mEq/L (ref 96–112)
GFR calc non Af Amer: 88 mL/min — ABNORMAL LOW (ref >90)
Glucose, Bld: 73 mg/dL (ref 70–99)
Potassium: 3.6 mEq/L (ref 3.5–5.1)
Sodium: 138 mEq/L (ref 135–145)

## 2013-08-07 LAB — MAGNESIUM: Magnesium: 2 mg/dL (ref 1.5–2.5)

## 2013-08-07 LAB — GLUCOSE, CAPILLARY
Glucose-Capillary: 68 mg/dL — ABNORMAL LOW (ref 70–99)
Glucose-Capillary: 69 mg/dL — ABNORMAL LOW (ref 70–99)
Glucose-Capillary: 75 mg/dL (ref 70–99)
Glucose-Capillary: 82 mg/dL (ref 70–99)
Glucose-Capillary: 84 mg/dL (ref 70–99)

## 2013-08-07 LAB — FOLATE RBC: RBC Folate: 929 ng/mL — ABNORMAL HIGH (ref >280)

## 2013-08-07 LAB — PHOSPHORUS: Phosphorus: 2.7 mg/dL (ref 2.3–4.6)

## 2013-08-07 LAB — PROCALCITONIN: Procalcitonin: <0.1 ng/mL

## 2013-08-07 MED ORDER — DEXTROSE-NACL 5-0.45 % IV SOLN
INTRAVENOUS | Status: DC
  Administered 2013-08-07 – 2013-08-11 (×4): via INTRAVENOUS

## 2013-08-07 MED ORDER — POTASSIUM CHLORIDE 10 MEQ/50ML IV SOLN
10.0000 meq | INTRAVENOUS | Status: DC
  Filled 2013-08-07: qty 50

## 2013-08-07 MED ORDER — POTASSIUM CHLORIDE 10 MEQ/100ML IV SOLN
10.0000 meq | INTRAVENOUS | Status: AC
  Administered 2013-08-07 (×3): 10 meq via INTRAVENOUS
  Filled 2013-08-07: qty 100

## 2013-08-07 MED ORDER — DEXTROSE 50 % IV SOLN: 25.0000 mL | Freq: Once | INTRAVENOUS | Status: AC | PRN

## 2013-08-07 NOTE — Evaluation (Signed)
Physical Therapy Evaluation Patient Details Name: [name] MRN: [id number] DOB: [birth year] Today's Date: 08/07/2013 Time: 4098-1191 PT Time Calculation (min): 16 min  PT Assessment / Plan / Recommendation History of Present Illness  Pt adm with AMS and resp failure. Work-up in progress.  Clinical Impression  Pt admitted with above. Pt currently with functional limitations due to the deficits listed below (see PT Problem List).  Pt will benefit from skilled PT to increase their independence and safety with mobility to allow discharge to the venue listed below.       PT Assessment  Patient needs continued PT services    Follow Up Recommendations  SNF    Does the patient have the potential to tolerate intense rehabilitation      Barriers to Discharge Decreased caregiver support      Equipment Recommendations  Other (comment) (TBD)    Recommendations for Other Services     Frequency Min 2X/week    Precautions / Restrictions Precautions Precautions: Fall   Pertinent Vitals/Pain VSS      Mobility  Bed Mobility Bed Mobility: Supine to Sit;Sitting - Scoot to [place] of Bed;Sit to Supine Supine to Sit: 1: +2 Total assist;HOB elevated Supine to Sit: Patient Percentage: 30% Sitting - Scoot to Edge of Bed: 1: +2 Total assist Sitting - Scoot to Edge of Bed: Patient Percentage: 30% Sit to Supine: 3: Mod assist;HOB elevated Transfers Transfers: Sit to Stand;Stand to Sit Sit to Stand: 1: +2 Total assist Sit to Stand: Patient Percentage: 20% Stand to Sit: 1: +2 Total assist Stand to Sit: Patient Percentage: 20% Details for Transfer Assistance: Pt only able to achieve partial stand - buttocks off bed but trunk, hips, and knees flexed.    Exercises     PT Diagnosis: Difficulty walking;Generalized weakness;Altered mental status  PT Problem List: Decreased strength;Decreased activity tolerance;Decreased balance;Decreased mobility;Decreased cognition;Decreased knowledge of use  of DME;Decreased safety awareness;Decreased knowledge of precautions PT Treatment Interventions: DME instruction;Gait training;Functional mobility training;Therapeutic activities;Therapeutic exercise;Balance training;Patient/family education     PT Goals(Current goals can be found in the care plan section) Acute Rehab PT Goals Patient Stated Goal: Pt unable to state. PT Goal Formulation: Patient unable to participate in goal setting Time For Goal Achievement: 08/15/13 Potential to Achieve Goals: Fair  Visit Information  Last PT Received On: 08/07/13 Assistance Needed: +2 History of Present Illness: Pt adm with AMS and resp failure. Work-up in progress.       Prior Functioning  Home Living Family/patient expects to be discharged to:: Private residence Living Arrangements: Other (Comment) (roommate) Additional Comments: Unsure of other information. Prior Function Comments: Per ED note pt was independent with mobility at home. Communication Communication: Expressive difficulties    Cognition  Cognition Arousal/Alertness: Awake/alert Behavior During Therapy: Restless Overall Cognitive Status: Difficult to assess Difficult to assess due to: Impaired communication    Extremity/Trunk Assessment Lower Extremity Assessment Lower Extremity Assessment: RLE deficits/detail RLE Deficits / Details: Noted to be weaker than lt during mobility   Balance Balance Balance Assessed: Yes Static Sitting Balance Static Sitting - Balance Support: Right upper extremity supported;Feet supported (rt foot) Static Sitting - Level of Assistance: 3: Mod assist Static Sitting - Comment/# of Minutes: Sat EOB x 7-8 minutes with pt leaning to rt.  ICU bed surface making balance more difficult.  End of Session PT - End of Session Activity Tolerance: Patient limited by fatigue Patient left: in bed;with call bell/phone within reach Nurse Communication: Mobility status  GP  Dionisia Pacholski 08/07/2013,  12:36 PM  [facility] PT [phone]

## 2013-08-07 NOTE — Progress Notes (Signed)
  Echocardiogram 2D Echocardiogram has been performed.  [name] FRANCES 08/07/2013, 5:13 PM

## 2013-08-07 NOTE — Evaluation (Addendum)
Clinical/Bedside Swallow Evaluation Patient Details  Name: [name] MRN: [id number] Date of Birth: [birth year]  Today's Date: 08/07/2013 Time: 0827-0857 SLP Time Calculation (min): 30 min  Past Medical History:  Past Medical History  Diagnosis Date  . Coronary artery disease     s/p PCI of LAD with BMS 04/03/06 at [facility]  . Hyperlipidemia   . Hypertension   . Cerebrovascular accident     history of cerebrovascular accident with residual left hemiparesis in 2002, seen at [facility]  . Multiple sclerosis   . Ischemic cardiomyopathy     s/p BiV ICD implanted 2007 at [facility]  . Chronic systolic congestive heart failure   . Bipolar disorder    Past Surgical History:  Past Surgical History  Procedure Laterality Date  . Tonsillectomy    . Vasectomy    . Implant of automatic cardioverter defibrillator      December 26, 2004 MDT BiV ICD implanted by Dr Chales Abrahams in [facility]  . Ptca      PCI LAD with BMS 04/03/06   HPI:  67 yo male adm to [facility] with AMS, severe dysarthria, has h/o right MCA CVA.  Pt with episode of emesis and required intubation 11/28-11/29.  Bedside swallow evaluation order received.     Assessment / Plan / Recommendation Clinical Impression  Pt demonstrates multiple risk factors for silent aspiration risk including vagal nerve, glossopharyngeal, hypoglossal, facial nerve impairments likely due to CVA/MS.   Pt was given ice, nectar, thin, pureed consistencies.  He demonstrated anterior labial spillage on right, decreased oral bolus manipulation with delayed oral transiting.  Also suspect delayed pharyngeal swallow response and multiple swallows with pt reporting stasis in pharynx.  Pt denies oral stasis but admits to pharyngeal residue that pt feels he clears with extra swallows.    Note pt had emesis with suspected aspiration requiring intubation, however pt admits to dysphagia - coughing with liquids prior to admission.    Pt is nearly  aphonic and has weak congested cough and is extremly dysarthric.  No overt coughing with intake but pt nearly aphonic demonstrating decreased airway protection.  Skilled intervention included educating pt to findings, possible care plan (po vs MBS vs wait for better recovery).  Also provided pt with education to goals of improved phonation, secretion management to aid in determining po readiness.     SLP highly recommends MBS prior to po administration due to clinical signs of dysphagia, neurological hx and CN deficits making him high silent aspiration risk.  RN informed.     Aspiration Risk  Severe    Diet Recommendation NPO        Other  Recommendations Recommended Consults: MBS Oral Care Recommendations: Oral care Q4 per protocol   Follow Up Recommendations       Frequency and Duration min 2x/week  2 weeks   Pertinent Vitals/Pain Afebrile, decreased    SLP Swallow Goals     Swallow Study Prior Functional Status   unknown, no family present to get history and pt very dysarthric    General Date of Onset: 08/07/13 HPI: 67 yo male adm to [facility] with AMS, severe dysarthria, has h/o right MCA CVA.  Pt with episode of emesis and required intubation 11/28-11/29.  Bedside swallow evaluation order received.   Type of Study: Bedside swallow evaluation Previous Swallow Assessment: failed RN stroke swallow screen Diet Prior to this Study: NPO Temperature Spikes Noted: No Respiratory Status: Nasal cannula History of Recent  Intubation: Yes Length of Intubations (days): 1 days Date extubated: 08/07/13 Behavior/Cognition: Alert Oral Cavity - Dentition: Dentures, not available;Poor condition (few lower dentition, poor condition) Self-Feeding Abilities: Total assist Patient Positioning: Upright in bed Baseline Vocal Quality: Low vocal intensity;Hoarse;Breathy Volitional Cough: Weak Volitional Swallow: Able to elicit    Oral/Motor/Sensory Function Labial ROM: Reduced right Labial  Symmetry: Abnormal symmetry right Labial Strength: Reduced Labial Sensation: Reduced Lingual ROM: Reduced right;Reduced left Lingual Symmetry: Within Functional Limits Lingual Strength: Reduced Lingual Sensation: Reduced Facial ROM: Reduced right Facial Symmetry: Right droop Facial Strength: Reduced Facial Sensation: Reduced Velum: Impaired left;Impaired right Mandible: Impaired   Ice Chips Ice chips: Impaired Presentation: Spoon Oral Phase Impairments: Reduced lingual movement/coordination;Impaired anterior to posterior transit;Reduced labial seal Oral Phase Functional Implications: Prolonged oral transit Pharyngeal Phase Impairments: Suspected delayed Swallow;Decreased hyoid-laryngeal movement   Thin Liquid Thin Liquid: Impaired Presentation: Cup;Spoon Oral Phase Impairments: Reduced lingual movement/coordination;Reduced labial seal;Impaired anterior to posterior transit Oral Phase Functional Implications: Prolonged oral transit;Right anterior spillage Pharyngeal  Phase Impairments: Suspected delayed Swallow;Multiple swallows    Nectar Thick Nectar Thick Liquid: Impaired Presentation: Spoon;Cup Oral Phase Impairments: Reduced lingual movement/coordination;Impaired anterior to posterior transit;Reduced labial seal Oral phase functional implications: Right anterior spillage;Prolonged oral transit Pharyngeal Phase Impairments: Suspected delayed Swallow;Multiple swallows   Honey Thick Honey Thick Liquid: Not tested   Puree Puree: Impaired Presentation: Spoon Oral Phase Impairments: Reduced labial seal;Reduced lingual movement/coordination;Impaired anterior to posterior transit Oral Phase Functional Implications: Prolonged oral transit Pharyngeal Phase Impairments: Suspected delayed Swallow;Multiple swallows   Solid   GO    Solid: Not tested       Donavan Burnet, MS [facility] SLP [phone]

## 2013-08-07 NOTE — Progress Notes (Signed)
PULMONARY  / CRITICAL CARE MEDICINE  Name: [name] MRN: [id number] DOB: Oct 05, 1945    ADMISSION DATE:  08/05/2013 CONSULTATION DATE:  08/05/2013  REFERRING MD :  EDP PRIMARY SERVICE: PCCM  CHIEF COMPLAINT:  Acute respiratory failure  BRIEF PATIENT DESCRIPTION: 67 y/o presented to [facility] with altered mental status. Developed emesis.  Intubated for airway protection.  SIGNIFICANT EVENTS / STUDIES:  11/28  Altered mental status, emesis, intubated for airway protection 11/28  Head CT >>> Atrophy / small vessel ischemic changes, large old R hemispheric infarct but no acute abnormalities.     LINES / TUBES: OETT 11/28 >>>11/29 OGT 11/28 >>> Foley 11/28 >>>  CULTURES: 11/28  Blood >>> 11/28  Urine >>> 11/28  Sputum >>>  ANTIBIOTICS: Vanco 11/28 x 1 Zosyn 11/28 >>>  SUBJECTIVE: Awake and follows command.  VITAL SIGNS: Temp:  [98.6 F (37 C)-99.3 F (37.4 C)] 98.6 F (37 C) (11/30 0747) Pulse Rate:  [30-91] 36 (11/30 1000) Resp:  [13-26] 18 (11/30 1000) BP: (104-161)/(56-89) 138/89 mmHg (11/30 1000) SpO2:  [95 %-100 %] 100 % (11/30 1000) FiO2 (%):  [40 %] 40 % (11/29 1200) Weight:  [55.5 kg (122 lb 5.7 oz)] 55.5 kg (122 lb 5.7 oz) (11/30 0500)  HEMODYNAMICS:    VENTILATOR SETTINGS: Vent Mode:  [-]  FiO2 (%):  [40 %] 40 %  INTAKE / OUTPUT: Intake/Output     11/29 0701 - 11/30 0700 11/30 0701 - 12/01 0700   I.V. (mL/kg) 464.4 (8.4) 20 (0.4)   Other 60    NG/GT 80    IV Piggyback 262.5    Total Intake(mL/kg) 866.9 (15.6) 20 (0.4)   Urine (mL/kg/hr) 287 (0.2)    Emesis/NG output     Total Output 287     Net +579.9 +20         PHYSICAL EXAMINATION: General:  Thin adult male in NAD on vent Neuro:  Arousable and follows command. HEENT:  OETT, mm pink/dry, thick beard Cardiovascular:  s1s2 distant tones, SR with PVC's Lungs:  resp's even/non-labored, lungs bilaterally coarse R>L Abdomen:  Flat, soft, bsx4 active Musculoskeletal:  No acute deformities Skin:   Warm/dry, no edema  LABS:  CBC  Recent Labs Lab 08/05/13 1053 08/05/13 1110 08/06/13 0310 08/07/13 0419  WBC 13.8*  --  12.6* 8.2  HGB 12.6* 13.3 12.1* 11.0*  HCT 37.2* 39.0 36.5* 32.5*  PLT 243  --  215 186   Coag's  Recent Labs Lab 08/05/13 1053  INR 1.10   BMET  Recent Labs Lab 08/05/13 1053 08/05/13 1110 08/05/13 1410 08/06/13 0310 08/07/13 0419  NA 137 139  --  136 138  K 4.0 4.0  --  3.8 3.6  CL 100 103  --  101 104  CO2 22  --   --  24 22  BUN 17 17  --  20 19  CREATININE 0.84 1.00 0.79 0.80 0.87  GLUCOSE 134* 138*  --  102* 73   Electrolytes  Recent Labs Lab 08/05/13 1053 08/06/13 0310 08/07/13 0419  CALCIUM 8.8 8.5 8.3*  MG  --  1.9 2.0  PHOS  --  3.5 2.7   Sepsis Markers  Recent Labs Lab 08/05/13 1054 08/05/13 1627 08/06/13 0310 08/07/13 0419  LATICACIDVEN 2.2  --   --   --   PROCALCITON  --  <0.10 0.13 <0.10   ABG  Recent Labs Lab 08/05/13 1129 08/06/13 0428  PHART 7.480* 7.432  PCO2ART 32.9* 36.8  PO2ART 74.0*  95.6   Liver Enzymes  Recent Labs Lab 08/05/13 1053 08/05/13 1645  AST 19 24  ALT 9 9  ALKPHOS 84 84  BILITOT 0.4 0.8  ALBUMIN 2.7* 2.8*   Cardiac Enzymes  Recent Labs Lab 08/05/13 1627 08/05/13 2235 08/06/13 0310  TROPONINI <0.30 <0.30 <0.30  PROBNP 16611.0*  --  11186.0*   Glucose  Recent Labs Lab 08/06/13 1537 08/06/13 1750 08/06/13 1956 08/07/13 0027 08/07/13 0442 08/07/13 0704  GLUCAP 68* 92 77 68* 74 75   CXR: 11/28 >>> Hardware in good position, R lung airspace disease  ASSESSMENT / PLAN:  PULMONARY A:  Acute respiratory failure in setting of acute encephalopathy and inability to protect airway.  Possible aspiration pneumonia. P:   - Extubated and doing well. - O2 as needed. - Early mobilization. - Albuterol / Atrovent PRN.  CARDIOVASCULAR A: CAD. HTN. ICM s/p BiV pacer / ICD. P:  - Hold Benazepril, Coreg. - ASA, Plavix. - Trend troponin / BNP. - Lopressor  PRN.  RENAL A:  Hypovolemia. P:   - Trend BMP. - Hold Lasix, Aldactone. - KVO IVF.  GASTROINTESTINAL A:  Nausea / vomiting, etiology unclear. P:   - Swallow evaluation then diet failed, maintain NPO. - D/C NGT. - Pepcid for GI Px.  HEMATOLOGIC A:  No active issues. P:  - Trend CBC. - Heparin for DVT Px.  INFECTIOUS A:  Possible aspiration pneumonia. P:   - Abx / cx as above.  ENDOCRINE A:  Hyperglycemia. P:   - SSI sensitive scale.  NEUROLOGIC A:  Acute encephalopathy, etiology unclear. Bipolar disorder. Multiple sclerosis by history (? Rx). Hx of CVA (noted old large R infarct on CT 11/28).  Questionable Hx of ETOH - negative ETOH & UDS on admit P:   - Hold prozac, depakene. - Neurology Consult appreciated. - Thiamin/folate. - D/C sedation.  Likely another stroke, recommend discussion of EOL at this point, patient has a very poor functional status.  Will transfer to SDU and to [facility], PCCM will sign off, please call back if needed.  I have personally obtained history, examined patient, evaluated and interpreted laboratory and imaging results, reviewed medical records, formulated assessment / plan and placed orders.  Alyson Reedy, M.D. [facility] Pulmonary/Critical Care Medicine. Pager: [phone]. After hours pager: [phone].

## 2013-08-07 NOTE — Progress Notes (Signed)
Subjective: Much more awake today.   Exam: Filed Vitals:   08/07/13 1300  BP:   Pulse: 72  Temp:   Resp: 21   Gen: In bed, NAD MS: Awake spontaneously, follows commands, dysarthric but does speak.  ZO:XWRUE, able to count fingers in all fields, eoomi Motor: has a R > L hemiparesis  Sensory:responds in all 4 ext  CTA - no clear large vessel occlusions, but does have severe R ICA stensosis EEG: no epileptiform activity  Impression: 67 year old male with rigth sided weakness, dysarthira, and possible aphasia. I strongly suspect that he has had a stroke.   He will need vascular surgery evaluation eventually for carotid stenosis if he improves to the point of being able to be a surgery candidate.    Recommendations: 1) Continue thiamine to finish 3 days high dose.  2) CT head today.  3) would pursue stroke workup including echo, lipid panel, a1c 4) continue plavix  Ritta Slot, MD [facility] [phone]  If 7pm- 7am, please page neurology on call at [phone].

## 2013-08-08 ENCOUNTER — Inpatient Hospital Stay (HOSPITAL_COMMUNITY): Payer: Medicare Other

## 2013-08-08 DIAGNOSIS — I10 Essential (primary) hypertension: Secondary | ICD-10-CM

## 2013-08-08 LAB — LIPID PANEL
Cholesterol: 155 mg/dL (ref 0–200)
HDL: 64 mg/dL (ref >39)
Total CHOL/HDL Ratio: 2.4 RATIO
Triglycerides: 86 mg/dL (ref <150)
VLDL: 17 mg/dL (ref 0–40)

## 2013-08-08 LAB — GLUCOSE, CAPILLARY
Glucose-Capillary: 82 mg/dL (ref 70–99)
Glucose-Capillary: 98 mg/dL (ref 70–99)
Glucose-Capillary: 82 mg/dL (ref 70–99)

## 2013-08-08 LAB — CBC
HCT: 32.5 % — ABNORMAL LOW (ref 39.0–52.0)
Hemoglobin: 11 g/dL — ABNORMAL LOW (ref 13.0–17.0)
MCV: 98.5 fL (ref 78.0–100.0)
RDW: 12.7 % (ref 11.5–15.5)
WBC: 7.3 10*3/uL (ref 4.0–10.5)

## 2013-08-08 LAB — BASIC METABOLIC PANEL
BUN: 18 mg/dL (ref 6–23)
Calcium: 8.4 mg/dL (ref 8.4–10.5)
Chloride: 105 mEq/L (ref 96–112)
Creatinine, Ser: 0.87 mg/dL (ref 0.50–1.35)
GFR calc Af Amer: >90 mL/min (ref >90)
Glucose, Bld: 69 mg/dL — ABNORMAL LOW (ref 70–99)
Potassium: 3.8 mEq/L (ref 3.5–5.1)
Sodium: 139 mEq/L (ref 135–145)

## 2013-08-08 LAB — HEMOGLOBIN A1C
Hgb A1c MFr Bld: 5.6 % (ref <5.7)
Mean Plasma Glucose: 114 mg/dL (ref <117)

## 2013-08-08 MED ORDER — SODIUM PHOSPHATE 3 MMOLE/ML IV SOLN
30.0000 mmol | Freq: Once | INTRAVENOUS | Status: AC
  Administered 2013-08-08: 30 mmol via INTRAVENOUS
  Filled 2013-08-08: qty 10

## 2013-08-08 MED ORDER — INSULIN ASPART 100 UNIT/ML ~~LOC~~ SOLN
0.0000 [IU] | Freq: Three times a day (TID) | SUBCUTANEOUS | Status: DC
  Administered 2013-08-09 – 2013-08-11 (×3): 2 [IU] via SUBCUTANEOUS

## 2013-08-08 NOTE — Progress Notes (Signed)
Patient transported to Radiology department for swallow evaluation. Jerlyn Ly, RN

## 2013-08-08 NOTE — Progress Notes (Signed)
eLink Physician-Brief Progress Note Patient Name: [name] DOB: June 26, 1946 MRN: [id number]  Date of Service  08/08/2013   HPI/Events of Note  Hypophosphatemia   eICU Interventions  Phos replaced   Intervention Category Intermediate Interventions: Electrolyte abnormality - evaluation and management  Ashan Cueva 08/08/2013, 5:50 AM

## 2013-08-08 NOTE — Progress Notes (Signed)
Patient ID: [name], male   DOB: 12-03-1945, 67 y.o.   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 Recent CTA reveals high grade rt ICA stenosis(string sign), chronic bilateral cervical pseudoaneurysms. Additional PMH as below. Imaging studies/hx reviewed by Dr. Corliss Skains. Exam: pt awake/able to follow commands, speech dysarthric , able to move all fours-sl weaker on right, tongue deviates to left, PERRL/EOMI; chest- sl dim BS rt base, left clear; heart-  sinus with occ ectopy; has left AICD; abd- soft,+BS,NT; ext- no edema.   Filed Vitals:   08/08/13 0800 08/08/13 0900 08/08/13 1000 08/08/13 1545  BP: 163/87 147/88 138/115 160/86  Pulse: 71 62 72 59  Temp:    97.9 F (36.6 C)  TempSrc:    Oral  Resp: 19 27 18 20   Height:      Weight:      SpO2: 100% 100% 100% 99%   Past Medical History  Diagnosis Date  . Coronary artery disease     s/p PCI of LAD with BMS 04/03/06 at [facility]  . Hyperlipidemia   . Hypertension   . Cerebrovascular accident     history of cerebrovascular accident with residual left hemiparesis in 2002, seen at [facility]  . Multiple sclerosis   . Ischemic cardiomyopathy     s/p BiV ICD implanted 2007 at [facility]  . Chronic systolic congestive heart failure   . Bipolar disorder    Past Surgical History  Procedure Laterality Date  . Tonsillectomy    . Vasectomy    . Implant of automatic cardioverter defibrillator      December 26, 2004 MDT BiV ICD implanted by Dr Chales Abrahams in [facility]  . Ptca      PCI LAD with BMS 04/03/06  Ct Angio Head W/cm &/or Wo Cm  08/05/2013   CLINICAL DATA:  67 year old patient presenting with altered mental status, found down. Abnormal speech. Developed emesis, and intubated for airway  protection. Initial encounter.  EXAM: CT ANGIOGRAPHY HEAD AND NECK  TECHNIQUE: Multidetector CT imaging of the head and neck was performed using the standard protocol during bolus administration of intravenous contrast. Multiplanar CT image reconstructions including MIPs were obtained to evaluate the vascular anatomy. Carotid stenosis measurements (when applicable) are obtained utilizing NASCET criteria, using the distal internal carotid diameter as the denominator.  CONTRAST:  50mL OMNIPAQUE IOHEXOL 350 MG/ML SOLN  COMPARISON:  CTA head and neck 03/16/2010.  FINDINGS: CTA HEAD FINDINGS  Calvarium intact. Visualized scalp soft tissues are within normal limits.  Chronic large right MCA infarct, primarily affecting the posterior MCA territory. Stable encephalomalacia. Stable cerebral volume. No ventriculomegaly. No midline shift, mass effect, or evidence of intracranial mass lesion. Additional scattered mostly subcortical cerebral white matter hypodensity not significantly changed and more apparent on the left. No acute intracranial hemorrhage identified. No evidence of cortically based acute infarction identified. No abnormal enhancement identified.  VASCULAR FINDINGS: Major intracranial venous structures are enhancing.  Stable distal vertebral arteries. Bilateral V4 segment calcified plaque without significant stenosis. Both PICA origins remain patent. Patent vertebrobasilar junction. Basilar artery is patent and stable without stenosis. SCA and PCA origins are stable and within normal limits. Bilateral PCA branches are stable and within normal limits.  Both ICA siphons are patent. Bilateral siphon calcified plaque has not significantly changed  and is not hemodynamically significant as before. Both carotid termini are patent. Bilateral MCA and ACA origins are stable and within normal limits. Mildly dominant right A1 segment as before.  Normal anterior communicating artery. Bilateral ACA branches are stable and  within normal limits. Bilateral MCA branches are stable. Left MCA branches are within normal limits (simple 5 branching pattern), and right MCA branches again remarkable for chronically decreased posterior division in keeping with the chronic infarct.  Review of the MIP images confirms the above findings.  CTA NECK FINDINGS  Left chest cardiac AICD. Endotracheal and oral enteric tubes. Enteric tube coursing in the esophagus. ET tube terminates above the carina. Apical tree-in-bud and more confluent and irregular distal peribronchovascular pulmonary opacity, superimposed on underlying chronic emphysema.  Degenerative changes in the cervical spine. No acute osseous abnormality identified. Low-density fluid level in the left maxillary sinus. Other Visualized paranasal sinuses and mastoids are clear. Poor residual dentition.  No superior mediastinal lymphadenopathy. Negative thyroid, larynx and pharynx (intubated, containing fluid), parapharyngeal spaces, retropharyngeal space, sublingual space, submandibular glands, and parotid glands. No cervical lymphadenopathy identified.  VASCULAR FINDINGS:  Three vessel arch configuration with Stable arch and great vessel origin atherosclerosis.  No right CCA origin stenosis. Chronic complex soft and calcified plaque at the right carotid bifurcation resulting in stenosis of both the right ECA and ICA origins. The right ICA stenosis appears mildly progressed and is high-grade, a radiographic string sign (series 4, image 69). Despite this, but a right ICA remains patent. 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 Stable patent vessel lumen at this level. This pseudoaneurysm encompasses 11 x 14 mm and is stable. 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 This also is stable measuring up to 10 mm diameter  (series 4, image 107).  No proximal right subclavian artery stenosis. Calcified and soft plaque at the right vertebral artery origin resulting in at least moderate stenosis. This appears increased. The right vertebral artery remains patent throughout the neck with with occasional calcified plaque. It is patent to the skullbase.  Stable left CCA without stenosis. Calcified plaque at the left carotid bifurcation is stable to mildly increased. No hemodynamically significant proximal left ICA stenosis results. There is a chronic mostly thrombosis distal left ICA pseudoaneurysm with rim calcification measuring up to 11 mm diameter (including the patent lumen). The lumen here appears patent and stable without stenosis.  No proximal left subclavian artery stenosis. Densely calcified plaque at the left vertebral artery origin resulting an at least moderate stenosis, stable. Mildly dominant left vertebral artery is stable and patent throughout the neck without additional plaque.  Review of the MIP images confirms the above findings.  IMPRESSION: CTA NECK FINDINGS:  1. Chronic bilateral carotid bifurcation atherosclerosis, on the right plaque is complex with bulky soft plaque component resulting in chronic high-grade right ICA stenosis, radiographic string sign. 2. Stable chronic bilateral cervical ICA pseudoaneurysms, mostly partially thrombosis. No subsequent additional significant cervical ICA stenosis. 3. Chronic plaque at both vertebral artery origins appears stable to increased and likely is hemodynamically significant bilaterally. Both vertebral arteries remain patent throughout the neck, as before the left is mildly dominant. 4. Abnormal tree-in-bud right upper lobe pulmonary opacity compatible with acute distal airway infection, superimposed on emphysema. CTA HEAD FINDINGS:  1. Stable CT appearance of the brain with chronic right MCA  infarct and advanced white matter changes. 2. Stable and largely unremarkable  intracranial CTA; calcified atherosclerotic plaque which is not hemodynamically significant, and no major circle of Willis branch occlusion.   Electronically Signed   By: Augusto Gamble M.D.   On: 08/05/2013 19:07   Dg Chest 1 View  08/05/2013   CLINICAL DATA:  Altered mental state.  Marland Kitchen  EXAM: CHEST - 1 VIEW  COMPARISON:  09/05/2010.  FINDINGS: Patient is rotated to the right in a semi upright position. Atelectasis right lung base. Mild infiltrate cannot be excluded. No pleural effusion or pneumothorax. Heart size normal. Cardiac pacer noted. No acute osseous abnormality.  IMPRESSION: 1. Right lower lobe atelectatic changes versus mild infiltrate. Suboptimal chest x-ray due to the patient's condition.  2.  Cardiac pacer.  No CHF.   Electronically Signed   By: Maisie Fus  Register   On: 08/05/2013 12:11   Ct Head Wo Contrast  08/07/2013   CLINICAL DATA:  Altered mental status.  EXAM: CT HEAD WITHOUT CONTRAST  TECHNIQUE: Contiguous axial images were obtained from the base of the skull through the vertex without intravenous contrast.  COMPARISON:  08/05/2013.  FINDINGS: Again noted is a large right temporoparietal old infarct. There is associated encephalomalacia. Diffuse cerebral cerebellar atrophy is again noted. No mass. No hemorrhage. No hydrocephalus. No acute bony abnormality. Paranasal sinuses are clear. Orbits are normal. Mastoids are clear. Soft tissue densities are noted in the external auditory canals. This is most likely cerumen, direct visualization however suggested to further evaluate. No acute bony abnormality.  IMPRESSION: 1. 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 Head CT is unchanged from 08/05/2013. 2. Diffuse cerebral atrophy and chronic white matter ischemic change. 3. Soft tissue densities in the external auditory canals bilaterally. This is most likely cerumen, however direct visualization is suggested.   Electronically Signed   By: Maisie Fus  Register   On: 08/07/2013 11:28    Ct Head Wo Contrast  08/05/2013   CLINICAL DATA:  Found lying beside couch, garbled speech, unable to follow directions, last seen normal at 2130 last night, history hypertension, hyperlipidemia, coronary artery disease, chronic systolic CHF, ischemic cardiomyopathy, stroke  EXAM: CT HEAD WITHOUT CONTRAST  TECHNIQUE: Contiguous axial images were obtained from the base of the skull through the vertex without intravenous contrast.  COMPARISON:  03/17/2010  FINDINGS: Generalized atrophy.  Normal ventricular morphology.  No midline shift or mass effect.  Small vessel chronic ischemic changes in deep cerebral white matter.  Old right right hemispheric infarct.  No intracranial hemorrhage, mass lesion, or evidence of acute infarction.  No extra-axial fluid collections.  Extensive atherosclerotic calcifications at skullbase.  Bones and sinuses unremarkable.  IMPRESSION: Atrophy with small vessel chronic ischemic changes in deep cerebral white matter.  Large old right hemispheric infarct.  No acute intracranial abnormalities.   Electronically Signed   By: Ulyses Southward M.D.   On: 08/05/2013 11:51   Ct Angio Neck W/cm &/or Wo/cm  08/05/2013   CLINICAL DATA:  67 year old patient presenting with altered mental status, found down. Abnormal speech. Developed emesis, and intubated for airway protection. Initial encounter.  EXAM: CT ANGIOGRAPHY HEAD AND NECK  TECHNIQUE: Multidetector CT imaging of the head and neck was performed using the standard protocol during bolus administration of intravenous contrast. Multiplanar CT image reconstructions including MIPs were obtained to evaluate the vascular anatomy. Carotid stenosis measurements (when applicable) are obtained utilizing NASCET criteria, using the distal internal carotid diameter as the denominator.  CONTRAST:  50mL OMNIPAQUE  IOHEXOL 350 MG/ML SOLN  COMPARISON:  CTA head and neck 03/16/2010.  FINDINGS: CTA HEAD FINDINGS  Calvarium intact. Visualized scalp soft tissues  are within normal limits.  Chronic large right MCA infarct, primarily affecting the posterior MCA territory. Stable encephalomalacia. Stable cerebral volume. No ventriculomegaly. No midline shift, mass effect, or evidence of intracranial mass lesion. Additional scattered mostly subcortical cerebral white matter hypodensity not significantly changed and more apparent on the left. No acute intracranial hemorrhage identified. No evidence of cortically based acute infarction identified. No abnormal enhancement identified.  VASCULAR FINDINGS: Major intracranial venous structures are enhancing.  Stable distal vertebral arteries. Bilateral V4 segment calcified plaque without significant stenosis. Both PICA origins remain patent. Patent vertebrobasilar junction. Basilar artery is patent and stable without stenosis. SCA and PCA origins are stable and within normal limits. Bilateral PCA branches are stable and within normal limits.  Both ICA siphons are patent. Bilateral siphon calcified plaque has not significantly changed and is not hemodynamically significant as before. Both carotid termini are patent. Bilateral MCA and ACA origins are stable and within normal limits. Mildly dominant right A1 segment as before.  Normal anterior communicating artery. Bilateral ACA branches are stable and within normal limits. Bilateral MCA branches are stable. Left MCA branches are within normal limits (simple 5 branching pattern), and right MCA branches again remarkable for chronically decreased posterior division in keeping with the chronic infarct.  Review of the MIP images confirms the above findings.  CTA NECK FINDINGS  Left chest cardiac AICD. Endotracheal and oral enteric tubes. Enteric tube coursing in the esophagus. ET tube terminates above the carina. Apical tree-in-bud and more confluent and irregular distal peribronchovascular pulmonary opacity, superimposed on underlying chronic emphysema.  Degenerative changes in the cervical  spine. No acute osseous abnormality identified. Low-density fluid level in the left maxillary sinus. Other Visualized paranasal sinuses and mastoids are clear. Poor residual dentition.  No superior mediastinal lymphadenopathy. Negative thyroid, larynx and pharynx (intubated, containing fluid), parapharyngeal spaces, retropharyngeal space, sublingual space, submandibular glands, and parotid glands. No cervical lymphadenopathy identified.  VASCULAR FINDINGS:  Three vessel arch configuration with Stable arch and great vessel origin atherosclerosis.  No right CCA origin stenosis. Chronic complex soft and calcified plaque at the right carotid bifurcation resulting in stenosis of both the right ECA and ICA origins. The right ICA stenosis appears mildly progressed and is high-grade, a radiographic string sign (series 4, image 69). Despite this, but a right ICA remains patent. 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 Stable patent vessel lumen at this level. This pseudoaneurysm encompasses 11 x 14 mm and is stable. 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 This also is stable measuring up to 10 mm diameter (series 4, image 107).  No proximal right subclavian artery stenosis. Calcified and soft plaque at the right vertebral artery origin resulting in at least moderate stenosis. This appears increased. The right vertebral artery remains patent throughout the neck with with occasional calcified plaque. It is patent to the skullbase.  Stable left CCA without stenosis. Calcified plaque at the left carotid bifurcation is stable to mildly increased. No hemodynamically significant proximal left ICA stenosis results. There is a chronic mostly thrombosis distal left ICA pseudoaneurysm with rim calcification measuring up to 11 mm diameter (including the  patent lumen). The lumen here appears patent and stable without  stenosis.  No proximal left subclavian artery stenosis. Densely calcified plaque at the left vertebral artery origin resulting an at least moderate stenosis, stable. Mildly dominant left vertebral artery is stable and patent throughout the neck without additional plaque.  Review of the MIP images confirms the above findings.  IMPRESSION: CTA NECK FINDINGS:  1. Chronic bilateral carotid bifurcation atherosclerosis, on the right plaque is complex with bulky soft plaque component resulting in chronic high-grade right ICA stenosis, radiographic string sign. 2. Stable chronic bilateral cervical ICA pseudoaneurysms, mostly partially thrombosis. No subsequent additional significant cervical ICA stenosis. 3. Chronic plaque at both vertebral artery origins appears stable to increased and likely is hemodynamically significant bilaterally. Both vertebral arteries remain patent throughout the neck, as before the left is mildly dominant. 4. Abnormal tree-in-bud right upper lobe pulmonary opacity compatible with acute distal airway infection, superimposed on emphysema. CTA HEAD FINDINGS:  1. Stable CT appearance of the brain with chronic right MCA infarct and advanced white matter changes. 2. Stable and largely unremarkable intracranial CTA; calcified atherosclerotic plaque which is not hemodynamically significant, and no major circle of Willis branch occlusion.   Electronically Signed   By: Augusto Gamble M.D.   On: 08/05/2013 19:07   Dg Chest Port 1 View  08/06/2013   CLINICAL DATA:  Evaluate airspace disease.  EXAM: PORTABLE CHEST - 1 VIEW  COMPARISON:  08/05/2013  FINDINGS: Endotracheal tube is 3.7 cm above the carina. Left cardiac ICD. Nasogastric tube extends into the abdomen. Patchy densities in the right lower lobe are again noted. Left lung base is clear. Heart size is stable. No evidence for a pneumothorax.  IMPRESSION: Patchy densities at the right lung  base are suggestive for pneumonia or aspiration.  Interval placement of a nasogastric tube.   Electronically Signed   By: Richarda Overlie M.D.   On: 08/06/2013 08:32   Dg Chest Port 1 View  08/05/2013   CLINICAL DATA:  Post intubation  EXAM: PORTABLE CHEST - 1 VIEW  COMPARISON:  08/05/2013 at 11:59 hr an portable chest radiograph 03/16/2010. CTA chest 08/29/2010.  FINDINGS: An endotracheal tube has been placed and is in satisfactory position. The distal tip is approximately 4 cm above the carina, and below the level of clavicles. Stable cardiomegaly and mediastinal contours. Left chest wall pacer/AICD noted. There is pulmonary vascular congestion. There is some volume loss in the right lung. Asymmetric hazy and patchy opacities in the right perihilar region and right lung base are suspicious for airspace disease. A large skin fold projects over the lateral left hemithorax, but lung markings are seen distal to it. No pneumothorax is seen. No visible pleural effusion.  IMPRESSION: 1. Satisfactory position of endotracheal tube.  2. Hazy opacities in the right midlung and right lung base are suspicious for airspace disease related to pneumonia or aspiration.  3. Skin fold projects over the left chest.  4. Stable cardiomegaly and pacer/AICD.   Electronically Signed   By: Britta Mccreedy M.D.   On: 08/05/2013 15:01   Dg Abd Portable 1v  08/05/2013   CLINICAL DATA:  Nausea and vomiting.  EXAM: PORTABLE ABDOMEN - 1 VIEW  COMPARISON:  Abdominal radiograph 08/29/2010.  FINDINGS: Nasogastric tube tip in the proximal stomach with side port near the gastroesophageal junction. Pacemaker wires seen projecting over the lower heart. Gas and stool are seen scattered throughout the colon extending to the level of the distal rectum. No pathologic distension of small bowel is noted. No gross evidence of pneumoperitoneum.  IMPRESSION: 1. Nonobstructive bowel gas pattern. 2. No pneumoperitoneum. 3. Support apparatus, as above.    Electronically Signed   By: Trudie Reed M.D.   On: 08/05/2013 17:46   Dg Swallowing Func-speech Pathology  08/08/2013   Breck Coons [place], CCC-SLP     08/08/2013  1:49 PM Objective Swallowing Evaluation: Modified Barium Swallowing Study   Patient Details  Name: [name] MRN: [id number] Date of Birth: [birth year]  Today's Date: 08/08/2013 Time: 1130-1150 SLP Time Calculation (min): 20 min  Past Medical History:  Past Medical History  Diagnosis Date  . Coronary artery disease     s/p PCI of LAD with BMS 04/03/06 at [facility]  . Hyperlipidemia   . Hypertension   . Cerebrovascular accident     history of cerebrovascular accident with residual left  hemiparesis in 2002, seen at [facility]  . Multiple sclerosis   . Ischemic cardiomyopathy     s/p BiV ICD implanted 2007 at [facility]  . Chronic systolic congestive heart failure   . Bipolar disorder    Past Surgical History:  Past Surgical History  Procedure Laterality Date  . Tonsillectomy    . Vasectomy    . Implant of automatic cardioverter defibrillator      December 26, 2004 MDT BiV ICD implanted by Dr Chales Abrahams in [facility]  . Ptca      PCI LAD with BMS 04/03/06   HPI:  67 yo male adm to [facility] with AMS, severe dysarthria, has h/o right  MCA CVA.  Pt with episode of emesis and required intubation  11/28-11/29.  Bedside swallow evaluation recommended an MBS.       Assessment / Plan / Recommendation Clinical Impression  Dysphagia Diagnosis: Mild pharyngeal phase dysphagia Clinical impression: Pt. exhibited min-mild sensory based  pharyngeal dysphagia.  Initiation of swallow was delayed to pt.'s  pyriform sinuses with liquid consistencies caused by decreased  sensation.  Protection of laryngeal vestibule and trachea was  adequate without penetration or aspiration observed.  Pharyngeal  residue was min-mild and intermittent.  Aspiration risk appears  mild per clinical observations during study, however pt.'s  awareness and cognitive status  appears decreased.  SLP recommends  a regular texture diet and thin liquids, straw ok, sit upright,  small sips and pills whole in applesauce. ST will continue to see  for safety with recommendations.     Treatment Recommendation  Therapy as outlined in treatment plan below    Diet Recommendation Thin liquid;Regular   Liquid Administration via: Cup;Straw Medication Administration: Whole meds with puree Supervision: Patient able to self feed;Full supervision/cueing  for compensatory strategies Compensations: Slow rate;Small sips/bites Postural Changes and/or Swallow Maneuvers: Seated upright 90  degrees    Other  Recommendations Oral Care Recommendations: Oral care BID   Follow Up Recommendations  None    Frequency and Duration min 2x/week  2 weeks   Pertinent Vitals/Pain No pain            Reason for Referral Objectively evaluate swallowing function   Oral Phase Oral Preparation/Oral Phase Oral Phase: WFL   Pharyngeal Phase Pharyngeal Phase Pharyngeal Phase: Impaired Pharyngeal - Honey Pharyngeal - Honey Teaspoon: Delayed swallow initiation;Premature  spillage to pyriform sinuses Pharyngeal - Honey Cup: Delayed swallow initiation;Premature  spillage to pyriform sinuses;Pharyngeal residue -  valleculae;Reduced tongue base retraction Pharyngeal - Nectar Pharyngeal - Nectar Teaspoon: Delayed swallow  initiation;Premature spillage to pyriform sinuses;Pharyngeal  residue - valleculae;Reduced tongue base retraction Pharyngeal - Nectar  Cup: Delayed swallow initiation;Premature  spillage to pyriform sinuses;Pharyngeal residue -  valleculae;Reduced tongue base retraction Pharyngeal - Thin Pharyngeal - Thin Teaspoon: Premature spillage to pyriform  sinuses;Delayed swallow initiation;Pharyngeal residue -  valleculae;Reduced tongue base retraction Pharyngeal - Thin Cup: Delayed swallow initiation;Premature  spillage to pyriform sinuses;Pharyngeal residue -  valleculae;Reduced tongue base retraction Pharyngeal - Thin Straw:  Delayed swallow initiation;Premature  spillage to pyriform sinuses;Pharyngeal residue -  valleculae;Reduced tongue base retraction Pharyngeal - Solids Pharyngeal - Puree: Within functional limits Pharyngeal - Regular: Within functional limits  Cervical Esophageal Phase    GO    Cervical Esophageal Phase Cervical Esophageal Phase: [facility]         Darrow Bussing.Ed CCC-SLP Pager [phone]  08/08/2013  Results for orders placed during the hospital encounter of 08/05/13  CULTURE, BLOOD (ROUTINE X 2)      Result Value Range   Specimen Description BLOOD RIGHT ANTECUBITAL     Special Requests BOTTLES DRAWN AEROBIC ONLY 10CC     Culture  Setup Time       Value: 08/05/2013 15:08     Performed at [facility]   Culture       Value:        BLOOD CULTURE RECEIVED NO GROWTH TO DATE CULTURE WILL BE HELD FOR 5 DAYS BEFORE ISSUING A FINAL NEGATIVE REPORT     Performed at [facility]   Report Status PENDING    CULTURE, BLOOD (ROUTINE X 2)      Result Value Range   Specimen Description BLOOD LEFT ANTECUBITAL     Special Requests BOTTLES DRAWN AEROBIC ONLY 5CC     Culture  Setup Time       Value: 08/05/2013 15:08     Performed at [facility]   Culture       Value:        BLOOD CULTURE RECEIVED NO GROWTH TO DATE CULTURE WILL BE HELD FOR 5 DAYS BEFORE ISSUING A FINAL NEGATIVE REPORT     Performed at [facility]   Report Status PENDING    URINE CULTURE      Result Value Range   Specimen Description URINE, CATHETERIZED     Special Requests Normal     Culture  Setup Time       Value: 08/05/2013 17:58     Performed at [facility] Count       Value: NO GROWTH     Performed at [facility]   Culture       Value: NO GROWTH     Performed at [facility]   Report Status 08/06/2013 FINAL    MRSA PCR SCREENING      Result Value Range   MRSA by PCR NEGATIVE  NEGATIVE  CBC WITH DIFFERENTIAL      Result Value Range   WBC 13.8 (*) 4.0 -  10.5 K/uL   RBC 3.80 (*) 4.22 - 5.81 MIL/uL   Hemoglobin 12.6 (*) 13.0 - 17.0 g/dL   HCT 14.7 (*) 82.9 - 56.2 %   MCV 97.9  78.0 - 100.0 fL   MCH 33.2  26.0 - 34.0 pg   MCHC 33.9  30.0 - 36.0 g/dL   RDW 13.0  86.5 - 78.4 %   Platelets 243  150 - 400 K/uL   Neutrophils Relative % 90 (*) 43 - 77 %   Neutro Abs 12.4 (*) 1.7 - 7.7 K/uL   Lymphocytes Relative  5 (*) 12 - 46 %   Lymphs Abs 0.7  0.7 - 4.0 K/uL   Monocytes Relative 5  3 - 12 %   Monocytes Absolute 0.7  0.1 - 1.0 K/uL   Eosinophils Relative 0  0 - 5 %   Eosinophils Absolute 0.0  0.0 - 0.7 K/uL   Basophils Relative 0  0 - 1 %   Basophils Absolute 0.0  0.0 - 0.1 K/uL  AMMONIA      Result Value Range   Ammonia 14  11 - 60 umol/L  COMPREHENSIVE METABOLIC PANEL      Result Value Range   Sodium 137  135 - 145 mEq/L   Potassium 4.0  3.5 - 5.1 mEq/L   Chloride 100  96 - 112 mEq/L   CO2 22  19 - 32 mEq/L   Glucose, Bld 134 (*) 70 - 99 mg/dL   BUN 17  6 - 23 mg/dL   Creatinine, Ser 7.82  0.50 - 1.35 mg/dL   Calcium 8.8  8.4 - 95.6 mg/dL   Total Protein 7.4  6.0 - 8.3 g/dL   Albumin 2.7 (*) 3.5 - 5.2 g/dL   AST 19  0 - 37 U/L   ALT 9  0 - 53 U/L   Alkaline Phosphatase 84  39 - 117 U/L   Total Bilirubin 0.4  0.3 - 1.2 mg/dL   GFR calc non Af Amer 89 (*) >90 mL/min   GFR calc Af Amer >90  >90 mL/min  URINE RAPID DRUG SCREEN (HOSP PERFORMED)      Result Value Range   Opiates NONE DETECTED  NONE DETECTED   Cocaine NONE DETECTED  NONE DETECTED   Benzodiazepines NONE DETECTED  NONE DETECTED   Amphetamines NONE DETECTED  NONE DETECTED   Tetrahydrocannabinol NONE DETECTED  NONE DETECTED   Barbiturates NONE DETECTED  NONE DETECTED  ETHANOL      Result Value Range   Alcohol, Ethyl (B) <11  0 - 11 mg/dL  LACTIC ACID, PLASMA      Result Value Range   Lactic Acid, Venous 2.2  0.5 - 2.2 mmol/L  URINALYSIS, ROUTINE W REFLEX MICROSCOPIC      Result Value Range   Color, Urine YELLOW  YELLOW   APPearance CLEAR  CLEAR   Specific  Gravity, Urine 1.022  1.005 - 1.030   pH 6.5  5.0 - 8.0   Glucose, UA NEGATIVE  NEGATIVE mg/dL   Hgb urine dipstick NEGATIVE  NEGATIVE   Bilirubin Urine NEGATIVE  NEGATIVE   Ketones, ur NEGATIVE  NEGATIVE mg/dL   Protein, ur 30 (*) NEGATIVE mg/dL   Urobilinogen, UA 1.0  0.0 - 1.0 mg/dL   Nitrite NEGATIVE  NEGATIVE   Leukocytes, UA NEGATIVE  NEGATIVE  PROTIME-INR      Result Value Range   Prothrombin Time 14.0  11.6 - 15.2 seconds   INR 1.10  0.00 - 1.49  URINE MICROSCOPIC-ADD ON      Result Value Range   WBC, UA 0-2  <3 WBC/hpf   RBC / HPF 0-2  <3 RBC/hpf  CK      Result Value Range   Total CK 92  7 - 232 U/L  CREATININE, SERUM      Result Value Range   Creatinine, Ser 0.79  0.50 - 1.35 mg/dL   GFR calc non Af Amer >90  >90 mL/min   GFR calc Af Amer >90  >90 mL/min  TSH  Result Value Range   TSH 1.604  0.350 - 4.500 uIU/mL  VITAMIN B12      Result Value Range   Vitamin B-12 163 (*) 211 - 911 pg/mL  TROPONIN I      Result Value Range   Troponin I <0.30  <0.30 ng/mL  TROPONIN I      Result Value Range   Troponin I <0.30  <0.30 ng/mL  TROPONIN I      Result Value Range   Troponin I <0.30  <0.30 ng/mL  PRO B NATRIURETIC PEPTIDE      Result Value Range   Pro B Natriuretic peptide (BNP) 16611.0 (*) 0 - 125 pg/mL  PROCALCITONIN      Result Value Range   Procalcitonin <0.10    VALPROIC ACID LEVEL      Result Value Range   Valproic Acid Lvl <10.0 (*) 50.0 - 100.0 ug/mL  HEPATIC FUNCTION PANEL      Result Value Range   Total Protein 7.3  6.0 - 8.3 g/dL   Albumin 2.8 (*) 3.5 - 5.2 g/dL   AST 24  0 - 37 U/L   ALT 9  0 - 53 U/L   Alkaline Phosphatase 84  39 - 117 U/L   Total Bilirubin 0.8  0.3 - 1.2 mg/dL   Bilirubin, Direct 0.1  0.0 - 0.3 mg/dL   Indirect Bilirubin 0.7  0.3 - 0.9 mg/dL  LIPASE, BLOOD      Result Value Range   Lipase 12  11 - 59 U/L  AMYLASE      Result Value Range   Amylase 55  0 - 105 U/L  CBC WITH DIFFERENTIAL      Result Value Range    WBC 12.6 (*) 4.0 - 10.5 K/uL   RBC 3.66 (*) 4.22 - 5.81 MIL/uL   Hemoglobin 12.1 (*) 13.0 - 17.0 g/dL   HCT 16.1 (*) 09.6 - 04.5 %   MCV 99.7  78.0 - 100.0 fL   MCH 33.1  26.0 - 34.0 pg   MCHC 33.2  30.0 - 36.0 g/dL   RDW 40.9  81.1 - 91.4 %   Platelets 215  150 - 400 K/uL   Neutrophils Relative % 81 (*) 43 - 77 %   Neutro Abs 10.2 (*) 1.7 - 7.7 K/uL   Lymphocytes Relative 12  12 - 46 %   Lymphs Abs 1.5  0.7 - 4.0 K/uL   Monocytes Relative 7  3 - 12 %   Monocytes Absolute 0.8  0.1 - 1.0 K/uL   Eosinophils Relative 0  0 - 5 %   Eosinophils Absolute 0.0  0.0 - 0.7 K/uL   Basophils Relative 0  0 - 1 %   Basophils Absolute 0.0  0.0 - 0.1 K/uL  BASIC METABOLIC PANEL      Result Value Range   Sodium 136  135 - 145 mEq/L   Potassium 3.8  3.5 - 5.1 mEq/L   Chloride 101  96 - 112 mEq/L   CO2 24  19 - 32 mEq/L   Glucose, Bld 102 (*) 70 - 99 mg/dL   BUN 20  6 - 23 mg/dL   Creatinine, Ser 7.82  0.50 - 1.35 mg/dL   Calcium 8.5  8.4 - 95.6 mg/dL   GFR calc non Af Amer >90  >90 mL/min   GFR calc Af Amer >90  >90 mL/min  BLOOD GAS, ARTERIAL      Result Value Range   FIO2  40.00     Delivery systems VENTILATOR     Mode PRESSURE REGULATED VOLUME CONTROL     VT 520     Rate 14.0     Peep/cpap 5.0     pH, Arterial 7.432  7.350 - 7.450   pCO2 arterial 36.8  35.0 - 45.0 mmHg   pO2, Arterial 95.6  80.0 - 100.0 mmHg   Bicarbonate 24.0  20.0 - 24.0 mEq/L   TCO2 25.1  0 - 100 mmol/L   Acid-Base Excess 0.3  0.0 - 2.0 mmol/L   O2 Saturation 97.2     Patient temperature 99.5     Collection site RIGHT RADIAL     Drawn by [phone]     Sample type ARTERIAL     Allens test (pass/fail) PASS  PASS  MAGNESIUM      Result Value Range   Magnesium 1.9  1.5 - 2.5 mg/dL  PHOSPHORUS      Result Value Range   Phosphorus 3.5  2.3 - 4.6 mg/dL  PROCALCITONIN      Result Value Range   Procalcitonin 0.13    PRO B NATRIURETIC PEPTIDE      Result Value Range   Pro B Natriuretic peptide (BNP) 11186.0 (*) 0 -  125 pg/mL  GLUCOSE, CAPILLARY      Result Value Range   Glucose-Capillary 119 (*) 70 - 99 mg/dL   Comment 1 Notify RN     Comment 2 Documented in Chart    FOLATE RBC      Result Value Range   RBC Folate 929 (*) >280 ng/mL  GLUCOSE, CAPILLARY      Result Value Range   Glucose-Capillary 104 (*) 70 - 99 mg/dL  CORTISOL-AM, BLOOD      Result Value Range   Cortisol - AM 17.8  4.3 - 22.4 ug/dL  GLUCOSE, CAPILLARY      Result Value Range   Glucose-Capillary 99  70 - 99 mg/dL   Comment 1 Documented in Chart     Comment 2 Notify RN    GLUCOSE, CAPILLARY      Result Value Range   Glucose-Capillary 95  70 - 99 mg/dL  GLUCOSE, CAPILLARY      Result Value Range   Glucose-Capillary 89  70 - 99 mg/dL  GLUCOSE, CAPILLARY      Result Value Range   Glucose-Capillary 83  70 - 99 mg/dL  GLUCOSE, CAPILLARY      Result Value Range   Glucose-Capillary 68 (*) 70 - 99 mg/dL  CBC      Result Value Range   WBC 8.2  4.0 - 10.5 K/uL   RBC 3.30 (*) 4.22 - 5.81 MIL/uL   Hemoglobin 11.0 (*) 13.0 - 17.0 g/dL   HCT 78.2 (*) 95.6 - 21.3 %   MCV 98.5  78.0 - 100.0 fL   MCH 33.3  26.0 - 34.0 pg   MCHC 33.8  30.0 - 36.0 g/dL   RDW 08.6  57.8 - 46.9 %   Platelets 186  150 - 400 K/uL  BASIC METABOLIC PANEL      Result Value Range   Sodium 138  135 - 145 mEq/L   Potassium 3.6  3.5 - 5.1 mEq/L   Chloride 104  96 - 112 mEq/L   CO2 22  19 - 32 mEq/L   Glucose, Bld 73  70 - 99 mg/dL   BUN 19  6 - 23 mg/dL   Creatinine, Ser 6.29  0.50 -  1.35 mg/dL   Calcium 8.3 (*) 8.4 - 10.5 mg/dL   GFR calc non Af Amer 88 (*) >90 mL/min   GFR calc Af Amer >90  >90 mL/min  MAGNESIUM      Result Value Range   Magnesium 2.0  1.5 - 2.5 mg/dL  PHOSPHORUS      Result Value Range   Phosphorus 2.7  2.3 - 4.6 mg/dL  GLUCOSE, CAPILLARY      Result Value Range   Glucose-Capillary 92  70 - 99 mg/dL  GLUCOSE, CAPILLARY      Result Value Range   Glucose-Capillary 77  70 - 99 mg/dL  PROCALCITONIN      Result Value Range    Procalcitonin <0.10    GLUCOSE, CAPILLARY      Result Value Range   Glucose-Capillary 68 (*) 70 - 99 mg/dL   Comment 1 Notify RN    GLUCOSE, CAPILLARY      Result Value Range   Glucose-Capillary 74  70 - 99 mg/dL  GLUCOSE, CAPILLARY      Result Value Range   Glucose-Capillary 75  70 - 99 mg/dL  GLUCOSE, CAPILLARY      Result Value Range   Glucose-Capillary 69 (*) 70 - 99 mg/dL  HEMOGLOBIN N8G      Result Value Range   Hemoglobin A1C 5.6  <5.7 %   Mean Plasma Glucose 114  <117 mg/dL  GLUCOSE, CAPILLARY      Result Value Range   Glucose-Capillary 84  70 - 99 mg/dL  CBC      Result Value Range   WBC 7.3  4.0 - 10.5 K/uL   RBC 3.30 (*) 4.22 - 5.81 MIL/uL   Hemoglobin 11.0 (*) 13.0 - 17.0 g/dL   HCT 95.6 (*) 21.3 - 08.6 %   MCV 98.5  78.0 - 100.0 fL   MCH 33.3  26.0 - 34.0 pg   MCHC 33.8  30.0 - 36.0 g/dL   RDW 57.8  46.9 - 62.9 %   Platelets 216  150 - 400 K/uL  BASIC METABOLIC PANEL      Result Value Range   Sodium 139  135 - 145 mEq/L   Potassium 3.8  3.5 - 5.1 mEq/L   Chloride 105  96 - 112 mEq/L   CO2 22  19 - 32 mEq/L   Glucose, Bld 69 (*) 70 - 99 mg/dL   BUN 18  6 - 23 mg/dL   Creatinine, Ser 5.28  0.50 - 1.35 mg/dL   Calcium 8.4  8.4 - 41.3 mg/dL   GFR calc non Af Amer 88 (*) >90 mL/min   GFR calc Af Amer >90  >90 mL/min  MAGNESIUM      Result Value Range   Magnesium 1.9  1.5 - 2.5 mg/dL  PHOSPHORUS      Result Value Range   Phosphorus 2.2 (*) 2.3 - 4.6 mg/dL  GLUCOSE, CAPILLARY      Result Value Range   Glucose-Capillary 82  70 - 99 mg/dL  LIPID PANEL      Result Value Range   Cholesterol 155  0 - 200 mg/dL   Triglycerides 86  <244 mg/dL   HDL 64  >01 mg/dL   Total CHOL/HDL Ratio 2.4     VLDL 17  0 - 40 mg/dL   LDL Cholesterol 74  0 - 99 mg/dL  GLUCOSE, CAPILLARY      Result Value Range   Glucose-Capillary 74  70 - 99 mg/dL  GLUCOSE, CAPILLARY      Result Value Range   Glucose-Capillary 82  70 - 99 mg/dL  GLUCOSE, CAPILLARY      Result Value  Range   Glucose-Capillary 84  70 - 99 mg/dL  GLUCOSE, CAPILLARY      Result Value Range   Glucose-Capillary 98  70 - 99 mg/dL  GLUCOSE, CAPILLARY      Result Value Range   Glucose-Capillary 82  70 - 99 mg/dL  POCT I-STAT, CHEM 8      Result Value Range   Sodium 139  135 - 145 mEq/L   Potassium 4.0  3.5 - 5.1 mEq/L   Chloride 103  96 - 112 mEq/L   BUN 17  6 - 23 mg/dL   Creatinine, Ser 1.19  0.50 - 1.35 mg/dL   Glucose, Bld 147 (*) 70 - 99 mg/dL   Calcium, Ion 8.29  5.62 - 1.30 mmol/L   TCO2 24  0 - 100 mmol/L   Hemoglobin 13.3  13.0 - 17.0 g/dL   HCT 13.0  86.5 - 78.4 %  POCT I-STAT 3, BLOOD GAS (G3+)      Result Value Range   pH, Arterial 7.480 (*) 7.350 - 7.450   pCO2 arterial 32.9 (*) 35.0 - 45.0 mmHg   pO2, Arterial 74.0 (*) 80.0 - 100.0 mmHg   Bicarbonate 24.5 (*) 20.0 - 24.0 mEq/L   TCO2 25  0 - 100 mmol/L   O2 Saturation 96.0     Acid-Base Excess 1.0  0.0 - 2.0 mmol/L   Collection site RADIAL, ALLEN'S TEST ACCEPTABLE     Drawn by RT     Sample type ARTERIAL     A/P: Pt with remote hx of rt MCA CVA; now with dysarthria, rt sided weakness/mild hemiparesis, left tongue deviation and finding of severe rt ICA stenosis/string sign on recent CTA.Tent plan is for diagnostic cerebral arteriogram on 12/2. Details/risks of procedure d/w pt. Attempt was made to reach pt's son, Casimiro Needle, however there was no response and voicemail was left. Pt's nurse will reattempt later this evening.   ;

## 2013-08-08 NOTE — Procedures (Signed)
Objective Swallowing Evaluation: Modified Barium Swallowing Study  Patient Details  Name: [name] MRN: [id number] Date of Birth: 1946/08/16  Today's Date: 08/08/2013 Time: 1130-1150 SLP Time Calculation (min): 20 min  Past Medical History:  Past Medical History  Diagnosis Date  . Coronary artery disease     s/p PCI of LAD with BMS 04/03/06 at [facility]  . Hyperlipidemia   . Hypertension   . Cerebrovascular accident     history of cerebrovascular accident with residual left hemiparesis in 2002, seen at [facility]  . Multiple sclerosis   . Ischemic cardiomyopathy     s/p BiV ICD implanted 2007 at [facility]  . Chronic systolic congestive heart failure   . Bipolar disorder    Past Surgical History:  Past Surgical History  Procedure Laterality Date  . Tonsillectomy    . Vasectomy    . Implant of automatic cardioverter defibrillator      December 26, 2004 MDT BiV ICD implanted by Dr Chales Abrahams in [facility]  . Ptca      PCI LAD with BMS 04/03/06   HPI:  67 yo male adm to [facility] with AMS, severe dysarthria, has h/o right MCA CVA.  Pt with episode of emesis and required intubation 11/28-11/29.  Bedside swallow evaluation recommended an MBS.       Assessment / Plan / Recommendation Clinical Impression  Dysphagia Diagnosis: Mild pharyngeal phase dysphagia Clinical impression: Pt. exhibited min-mild sensory based pharyngeal dysphagia.  Initiation of swallow was delayed to pt.'s pyriform sinuses with liquid consistencies caused by decreased sensation.  Protection of laryngeal vestibule and trachea was adequate without penetration or aspiration observed.  Pharyngeal residue was min-mild and intermittent.  Aspiration risk appears mild per clinical observations during study, however pt.'s awareness and cognitive status appears decreased.  SLP recommends a regular texture diet and thin liquids, straw ok, sit upright, small sips and pills whole in applesauce. ST will  continue to see for safety with recommendations.     Treatment Recommendation  Therapy as outlined in treatment plan below    Diet Recommendation Thin liquid;Regular   Liquid Administration via: Cup;Straw Medication Administration: Whole meds with puree Supervision: Patient able to self feed;Full supervision/cueing for compensatory strategies Compensations: Slow rate;Small sips/bites Postural Changes and/or Swallow Maneuvers: Seated upright 90 degrees    Other  Recommendations Oral Care Recommendations: Oral care BID   Follow Up Recommendations  None    Frequency and Duration min 2x/week  2 weeks   Pertinent Vitals/Pain No pain            Reason for Referral Objectively evaluate swallowing function   Oral Phase Oral Preparation/Oral Phase Oral Phase: WFL   Pharyngeal Phase Pharyngeal Phase Pharyngeal Phase: Impaired Pharyngeal - Honey Pharyngeal - Honey Teaspoon: Delayed swallow initiation;Premature spillage to pyriform sinuses Pharyngeal - Honey Cup: Delayed swallow initiation;Premature spillage to pyriform sinuses;Pharyngeal residue - valleculae;Reduced tongue base retraction Pharyngeal - Nectar Pharyngeal - Nectar Teaspoon: Delayed swallow initiation;Premature spillage to pyriform sinuses;Pharyngeal residue - valleculae;Reduced tongue base retraction Pharyngeal - Nectar Cup: Delayed swallow initiation;Premature spillage to pyriform sinuses;Pharyngeal residue - valleculae;Reduced tongue base retraction Pharyngeal - Thin Pharyngeal - Thin Teaspoon: Premature spillage to pyriform sinuses;Delayed swallow initiation;Pharyngeal residue - valleculae;Reduced tongue base retraction Pharyngeal - Thin Cup: Delayed swallow initiation;Premature spillage to pyriform sinuses;Pharyngeal residue - valleculae;Reduced tongue base retraction Pharyngeal - Thin Straw: Delayed swallow initiation;Premature spillage to pyriform sinuses;Pharyngeal residue - valleculae;Reduced tongue base  retraction Pharyngeal - Solids Pharyngeal -  Puree: Within functional limits Pharyngeal - Regular: Within functional limits  Cervical Esophageal Phase    GO    Cervical Esophageal Phase Cervical Esophageal Phase: [facility]         Darrow Bussing.[employer] [phone]  08/08/2013

## 2013-08-08 NOTE — Progress Notes (Signed)
Patient transported to 4N28 at 12:30. Tolerated transport with no distress.  Jerlyn Ly, RN

## 2013-08-08 NOTE — Care Management Note (Signed)
    Page 1 of 1   08/08/2013     1:17:29 PM   CARE MANAGEMENT NOTE 08/08/2013  Patient:  Kenneth, Reese   Account Number:  [id number]  Date Initiated:  08/08/2013  Documentation initiated by:  [facility]  Subjective/Objective Assessment:   Admitted with AMS - unable to wake up.  ?? stroke - pneumonia.     Action/Plan:   Anticipated DC Date:  08/11/2013   Anticipated DC Plan:  SKILLED NURSING FACILITY  In-house referral  Clinical Social Worker      DC Planning Services  CM consult      Choice offered to / List presented to:             Status of service:  In process, will continue to follow Medicare Important Message given?   (If response is "NO", the following Medicare IM given date fields will be blank) Date Medicare IM given:   Date Additional Medicare IM given:    Discharge Disposition:    Per UR Regulation:  Reviewed for med. necessity/level of care/duration of stay  If discussed at Long Length of Stay Meetings, dates discussed:    Comments:  Contact:  [name] [phone]                 [name] [id number]  08-08-13 1:15pm [name], RNBSN [phone] Nurse reports that she just got off phone with patients son, who is in [place].  Patient lives at home with caregiver but feels this is probably not enough now.  Interested in SNF here or possibly in [place].  SW consult placed.  Will also need PT/OT ordered. CM will continue to follow.

## 2013-08-08 NOTE — Clinical Social Work Psychosocial (Signed)
Clinical Social Work Department BRIEF PSYCHOSOCIAL ASSESSMENT 08/08/2013  Patient:  [name], [name]     Account Number:  [id number]     Admit date:  08/05/2013  Clinical Social Worker:  Read Drivers  Date/Time:  08/08/2013 02:28 PM  Referred by:  Care Management  Date Referred:  08/08/2013 Referred for  SNF Placement   Other Referral:   none   Interview type:  Other - See comment Other interview type:   son, Casimiro Needle    PSYCHOSOCIAL DATA Living Status:  SIGNIFICANT OTHER Admitted from facility:   Level of care:   Primary support name:  Casimiro Needle Primary support relationship to patient:  CHILD, ADULT Degree of support available:   adequate    CURRENT CONCERNS Current Concerns  Post-Acute Placement   Other Concerns:   Pt son reports dad not being able to take care of himself and his signiticant other was not able to care for him either.  Son reports that pt is non-compliant with meds and has been depressed for a while    SOCIAL WORK ASSESSMENT / PLAN CSW was contacted from unit RN, Drewcila to call son, Casimiro Needle who lives in [place].  PT is recommending SNF.  Son in [place] is also requesting SNF placement for pt.  CSW explained to son that at this point, per chart, that pt seems to be alert and oriented x4 and that dad would need to be agreeable to SNF in order for [place] to place him.  Casimiro Needle reports that dad does not have an appropriate caregiver and would not be a safe dc if dad were to return home.  CSW explained that if dad refused SNF and if medical staff thought it would not be a safe dc for dad to dc home that we would consult psychiatry to assist with determining capacity for such decisions.  Son was agreeable for this, if needed.  Son can be reached by phone day/night and wishes to be contacted with updates.  Pt has transferred off of 65M and this CSW has given report to the 4N CSW who wil lbe assisting pt.   Assessment/plan status:  Psychosocial Support/Ongoing Assessment of  Needs Other assessment/ plan:   none   Information/referral to community resources:   SNF    PATIENT'S/FAMILY'S RESPONSE TO PLAN OF CARE: Pt son was very appreciative of CSW assistance with pt disposition.        Vickii Penna, LCSWA [phone]  Clinical Social Work

## 2013-08-08 NOTE — Progress Notes (Signed)
Subjective: Dysarthria slightly better today, but still severe  Exam: Filed Vitals:   08/08/13 1545  BP: 160/86  Pulse: 59  Temp: 97.9 F (36.6 C)  Resp: 20   Gen: In bed, NAD MS: Awake, Alert, no signs of aphasia.  NW:GNFAO, EOMI, left facial droop(old). Significant left tongue deviation which the patient states is new, significant dysarthria which the patient also states is new.  Motor: 5/5 on left side, on the right he has a 4+/5 hemiparesis which has had some improvement.  Sensory:intact to LT  LDL 74, ok Echo pending A1C 5.6   Impression: 67 yo M with a history of previous right MCA stroke who was found confused, brought in the emergency room where he aspirated and was not protecting his where airway and do this was intubated. Following this, he was altered but has since much improved however he continues to have a mild right hemiparesis as well as severe dysarthria with a new left tongue deviation. I suspect that he has had a stroke affecting the cortical spinal tract as well as fibers coming from the hypoglossal nerve. I suspect that was his aspiration event more than a stroke that caused him to be altered on arrival.  He has a string sign on the right, but with his calcific plaque I feel that catheter angiogram would likely be beneficial. I have discussed with Dr. Corliss Skains and arranged for an angiogram tomorrow.   I discussed with the patient and he states that though he would consider surgery or procedures if recommended.   Recommendations: 1) continue plavix.  2) Catheter angiogram tomorrow morning.  3) NPO after midnight 4) PT,OT, ST   Ritta Slot, MD [facility] [phone]  If 7pm- 7am, please page neurology on call at [phone].

## 2013-08-08 NOTE — Progress Notes (Signed)
Subjective: [name] was admitted for change in consciousness - unresponsive - found on the morning of admission. He was last seen the night before. Due to inability to protect his airway he was intubated. During his course of evaluation he has an aspiration pneumonia and most likely suffered a new stroke. He has been followed by CCM and nuerology. As he is now extubated CCM transferred care to PCP/TRH.   [name] is awake. His speech is impaired but he clearly states he does not want CPR or reintubation. He does want to eat.   Objective: Lab:  Recent Labs  08/05/13 1053  08/06/13 0310 08/07/13 0419 08/08/13 0352  WBC 13.8*  --  12.6* 8.2 7.3  NEUTROABS 12.4*  --  10.2*  --   --   HGB 12.6*  < > 12.1* 11.0* 11.0*  HCT 37.2*  < > 36.5* 32.5* 32.5*  MCV 97.9  --  99.7 98.5 98.5  PLT 243  --  215 186 216  < > = values in this interval not displayed.  Recent Labs  08/06/13 0310 08/07/13 0419 08/08/13 0352  NA 136 138 139  K 3.8 3.6 3.8  CL 101 104 105  GLUCOSE 102* 73 69*  BUN 20 19 18   CREATININE 0.80 0.87 0.87  CALCIUM 8.5 8.3* 8.4  MG 1.9 2.0 1.9  PHOS 3.5 2.7 2.2*   A1C 5.6%, lipid panel is good with LD 74  Imaging: 11/29 CXR - IMPRESSION:  Patchy densities at the right lung base are suggestive for pneumonia  or aspiration.  11/230/14 - 2 D echo -Study Conclusions  - Left ventricle: The cavity size was severely dilated. Wall thickness was normal. The estimated ejection fraction was 20%. Diffuse hypokinesis. - Aortic valve: Sclerosis without stenosis. No significant regurgitation. - Left atrium: The atrium was moderately to severely dilated. - Right ventricle: The cavity size was mildly dilated. Pacer wire or catheter noted in right ventricle. Systolic function was mildly reduced. - Right atrium: The atrium was mildly dilated. - Impressions: No cardiac source of embolism was identified, but cannot be ruled out on the basis of this  examination. Impressions:  - No cardiac source of embolism was identified, but cannot be ruled out on the basis of this examination.  CT Angio neck: CTA NECK FINDINGS:  1. Chronic bilateral carotid bifurcation atherosclerosis, on the  right plaque is complex with bulky soft plaque component resulting  in chronic high-grade right ICA stenosis, radiographic string sign.  2. Stable chronic bilateral cervical ICA pseudoaneurysms, mostly  partially thrombosis. No subsequent additional significant cervical  ICA stenosis.  3. Chronic plaque at both vertebral artery origins appears stable to  increased and likely is hemodynamically significant bilaterally.  Both vertebral arteries remain patent throughout the neck, as before  the left is mildly dominant.  4. Abnormal tree-in-bud right upper lobe pulmonary opacity  compatible with acute distal airway infection, superimposed on  emphysema.  CTA brain: CTA HEAD FINDINGS:  1. Stable CT appearance of the brain with chronic right MCA infarct  and advanced white matter changes.  2. Stable and largely unremarkable intracranial CTA; calcified  atherosclerotic plaque which is not hemodynamically significant, and  no major circle of Willis branch occlusion.  CT brain 11/30: IMPRESSION:  1. 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  Head CT is unchanged from 08/05/2013.  2. Diffuse cerebral atrophy and chronic white matter ischemic  change.  3. Soft tissue densities in the external auditory canals  bilaterally. This  is most likely cerumen, however direct  visualization is suggested.   Scheduled Meds: . atorvastatin  40 mg Oral Daily  . chlorhexidine  15 mL Mouth/Throat Q12H  . clopidogrel  75 mg Oral Daily  . famotidine (PEPCID) IV  20 mg Intravenous Q24H  . heparin  5,000 Units Subcutaneous Q8H  . insulin aspart  0-9 Units Subcutaneous Q4H  . piperacillin-tazobactam (ZOSYN)  IV  3.375 g Intravenous Q8H  . sodium  chloride  3 mL Intravenous Q12H  . sodium phosphate  Dextrose 5% IVPB  30 mmol Intravenous Once  . thiamine (VITAMIN B1) IVPB  500 mg Intravenous TID   Continuous Infusions: . dextrose 5 % and 0.45% NaCl 50 mL/hr at 08/07/13 1400  . propofol Stopped (08/06/13 0742)   PRN Meds:.acetaminophen, acetaminophen, albuterol, etomidate, ipratropium, metoprolol, ondansetron (ZOFRAN) IV, ondansetron, succinylcholine   Physical Exam: Filed Vitals:   08/08/13 0600  BP:   Pulse: 92  Temp:   Resp: 24   Gen'l - chronically ill appearing white man in no distress HEENT- poor dentition, temporal wasting Cor 2+ radial, heart sounds distant but regular Pulm - no increased WOB , no rales or wheezes Abd - scaphoid, BS+ Neuro - awake, seems alert, dysarthric.      Assessment/Plan: 1. Neuro - appear to have had stroke but did not show up on last CT brain. Plan PT/OT, SLP  2. Cardiac - troponins normal, BNP 11186. 2 D echo with profound cardiomyopathy Plan  careful monitoring for heart failure.  3. ID - probable aspiration pneumonia with abnormal CXr. Day #3 zosyn. O2 sats are OK  4. F/E/N - lytes and renal function OK. Has not eaten.  Plan  SLP evaluation pending.  If he cannot eat will need to discuss Enteral feeding  Code status - patient clearly indicated to this Clinical research associate and to RN that he did not want to live this way and that he would not heroic care: no CPR, no reintubation. Will change code status. Will change transfer to med-srug.    Illene Regulus Hand IM (o) [phone]; (c) [phone] Call-grp - Patsi Sears IM  Tele: [phone]  08/08/2013, 7:54 AM

## 2013-08-08 NOTE — Progress Notes (Signed)
ANTIBIOTIC CONSULT NOTE - FOLLOW UP  Pharmacy Consult:  Zosyn Indication:  Empiric coverage for possible aspiration and abdominal sourse  Allergies  Allergen Reactions  . Meperidine Hcl     Patient Measurements: Height: 5' 6.14" (168 cm) Weight: 123 lb 7.3 oz (56 kg) IBW/kg (Calculated) : 64.13  Vital Signs: Temp: 98.5 F (36.9 C) (12/01 0754) Temp src: Oral (12/01 0754) BP: 122/72 mmHg (12/01 0500) Pulse Rate: 92 (12/01 0600) Intake/Output from previous day: 11/30 0701 - 12/01 0700 In: 850 [I.V.:850] Out: 1425 [Urine:1425]  Labs:  Recent Labs  08/06/13 0310 08/07/13 0419 08/08/13 0352  WBC 12.6* 8.2 7.3  HGB 12.1* 11.0* 11.0*  PLT 215 186 216  CREATININE 0.80 0.87 0.87   Estimated Creatinine Clearance: 66.2 ml/min (by C-G formula based on Cr of 0.87). No results found for this basename: VANCOTROUGH, VANCOPEAK, VANCORANDOM, GENTTROUGH, GENTPEAK, GENTRANDOM, TOBRATROUGH, TOBRAPEAK, TOBRARND, AMIKACINPEAK, AMIKACINTROU, AMIKACIN,  in the last 72 hours   Microbiology: Recent Results (from the past 720 hour(s))  CULTURE, BLOOD (ROUTINE X 2)     Status: None   Collection Time    08/05/13 10:50 AM      Result Value Range Status   Specimen Description BLOOD RIGHT ANTECUBITAL   Final   Special Requests BOTTLES DRAWN AEROBIC ONLY 10CC   Final   Culture  Setup Time     Final   Value: 08/05/2013 15:08     Performed at [facility]   Culture     Final   Value:        BLOOD CULTURE RECEIVED NO GROWTH TO DATE CULTURE WILL BE HELD FOR 5 DAYS BEFORE ISSUING A FINAL NEGATIVE REPORT     Performed at [facility]   Report Status PENDING   Incomplete  CULTURE, BLOOD (ROUTINE X 2)     Status: None   Collection Time    08/05/13 10:55 AM      Result Value Range Status   Specimen Description BLOOD LEFT ANTECUBITAL   Final   Special Requests BOTTLES DRAWN AEROBIC ONLY 5CC   Final   Culture  Setup Time     Final   Value: 08/05/2013 15:08     Performed at [facility]   Culture     Final   Value:        BLOOD CULTURE RECEIVED NO GROWTH TO DATE CULTURE WILL BE HELD FOR 5 DAYS BEFORE ISSUING A FINAL NEGATIVE REPORT     Performed at [facility]   Report Status PENDING   Incomplete  URINE CULTURE     Status: None   Collection Time    08/05/13 11:24 AM      Result Value Range Status   Specimen Description URINE, CATHETERIZED   Final   Special Requests Normal   Final   Culture  Setup Time     Final   Value: 08/05/2013 17:58     Performed at [facility] Count     Final   Value: NO GROWTH     Performed at [facility]   Culture     Final   Value: NO GROWTH     Performed at [facility]   Report Status 08/06/2013 FINAL   Final  MRSA PCR SCREENING     Status: None   Collection Time    08/05/13  5:16 PM      Result Value Range Status   MRSA by PCR NEGATIVE  NEGATIVE Final   Comment:            The GeneXpert MRSA Assay (FDA     approved for NASAL specimens     only), is one component of a     comprehensive MRSA colonization     surveillance program. It is not     intended to diagnose MRSA     infection nor to guide or     monitor treatment for     MRSA infections.      Assessment: 67 yo male admitted with AMS, vomiting and acute respiratory failure.  Patient continues on Zosyn for possible aspiration PNA.  His renal function has been stable.  11/28 Urine - negative 11/28 Bld. - NGTD 11/28 MRSA PCR - negative   Goal of Therapy:  Infection prevention / Clearance of infection   Plan:  - Continue Zosyn 3.375gm IV Q8H, 4 hr infusion - Monitor renal fxn, micro data   Parish Augustine D. Laney Potash, PharmD, BCPS Pager:  [phone] 08/08/2013, 10:28 AM

## 2013-08-09 ENCOUNTER — Other Ambulatory Visit (HOSPITAL_COMMUNITY): Payer: Medicare Other

## 2013-08-09 ENCOUNTER — Inpatient Hospital Stay (HOSPITAL_COMMUNITY): Payer: Medicare Other

## 2013-08-09 DIAGNOSIS — E43 Unspecified severe protein-calorie malnutrition: Secondary | ICD-10-CM | POA: Insufficient documentation

## 2013-08-09 LAB — BASIC METABOLIC PANEL
BUN: 16 mg/dL (ref 6–23)
Calcium: 8.4 mg/dL (ref 8.4–10.5)
Creatinine, Ser: 0.98 mg/dL (ref 0.50–1.35)
GFR calc Af Amer: >90 mL/min (ref >90)
GFR calc non Af Amer: 84 mL/min — ABNORMAL LOW (ref >90)
Potassium: 3.6 mEq/L (ref 3.5–5.1)
Sodium: 136 mEq/L (ref 135–145)

## 2013-08-09 LAB — CBC
HCT: 31.3 % — ABNORMAL LOW (ref 39.0–52.0)
MCH: 32.5 pg (ref 26.0–34.0)
MCHC: 32.9 g/dL (ref 30.0–36.0)
RDW: 12.6 % (ref 11.5–15.5)

## 2013-08-09 LAB — GLUCOSE, CAPILLARY
Glucose-Capillary: 91 mg/dL (ref 70–99)
Glucose-Capillary: 159 mg/dL — ABNORMAL HIGH (ref 70–99)

## 2013-08-09 MED ORDER — FAMOTIDINE 20 MG PO TABS
20.0000 mg | ORAL_TABLET | Freq: Every day | ORAL | Status: DC
  Administered 2013-08-09 – 2013-08-12 (×4): 20 mg via ORAL
  Filled 2013-08-09 (×4): qty 1

## 2013-08-09 MED ORDER — FENTANYL CITRATE 0.05 MG/ML IJ SOLN
INTRAMUSCULAR | Status: AC | PRN
  Administered 2013-08-09: 25 ug via INTRAVENOUS

## 2013-08-09 MED ORDER — HEPARIN SODIUM (PORCINE) 1000 UNIT/ML IJ SOLN
INTRAMUSCULAR | Status: AC | PRN
  Administered 2013-08-09: 1000 [IU] via INTRAVENOUS

## 2013-08-09 MED ORDER — MIDAZOLAM HCL 2 MG/2ML IJ SOLN
INTRAMUSCULAR | Status: AC | PRN
  Administered 2013-08-09: 1 mg via INTRAVENOUS

## 2013-08-09 MED ORDER — FENTANYL CITRATE 0.05 MG/ML IJ SOLN
INTRAMUSCULAR | Status: AC
  Filled 2013-08-09: qty 2

## 2013-08-09 MED ORDER — IOHEXOL 300 MG/ML  SOLN
150.0000 mL | Freq: Once | INTRAMUSCULAR | Status: AC | PRN
  Administered 2013-08-09: 100 mL via INTRA_ARTERIAL

## 2013-08-09 MED ORDER — SODIUM CHLORIDE 0.9 % IV SOLN: INTRAVENOUS | Status: AC

## 2013-08-09 MED ORDER — MIDAZOLAM HCL 2 MG/2ML IJ SOLN
INTRAMUSCULAR | Status: AC
  Filled 2013-08-09: qty 4

## 2013-08-09 NOTE — Procedures (Signed)
S/P 4 vessle cerebral arteriogram. RT CFA approach. Findings 1.(5 % stenosis RT ICA prox. 2.90% stenosis of both Vert artery origins 3.70%stenosis of RT ICA distal cervical segment.

## 2013-08-09 NOTE — Progress Notes (Signed)
Speech Language Pathology Treatment: Dysphagia  Patient Details Name: [name] MRN: [id number] DOB: 02-04-1946 Today's Date: 08/09/2013 Time: 4098-1191 SLP Time Calculation (min): 15 min  Assessment / Plan / Recommendation Clinical Impression  Treatment focused on safety and efficiency with diet/liquid recommendations with caregiver at bedside.  Pt. Currently upset due to NPO status this morning (cerebral angiogram).  Procedure completed 4 hours ago therefore pt. allowed to sit upright.  Vocal quality wet prior to po's; increased chest congestion today?  He required max verbal and tactile cues to slow rate with liquids and small bites with cracker.  Oral prep and manipulation mildly delayed.  Delayed cough 2-3 times during session; difficult to associate with po's versus pna.  Overall he does not appear as lucid or as strong as yesterday.  SLP will downgrade texture to Dys 3 and continue thin liquids.  Discussed with RN to downgrade to nectar if s/s aspiration are exhibited with thin.  SLP will return next date to diagnostically assess safest texture.   HPI HPI: 67 yo male adm to [facility] with AMS, severe dysarthria, has h/o right MCA CVA.  Pt with episode of emesis and required intubation 11/28-11/29.  Bedside swallow evaluation recommended an MBS.     Pertinent Vitals No pain  SLP Plan  Continue with current plan of care    Recommendations Diet recommendations: Dysphagia 3 (mechanical soft);Thin liquid Liquids provided via: Cup;Straw Medication Administration: Whole meds with puree Supervision: Patient able to self feed;Full supervision/cueing for compensatory strategies Compensations: Slow rate;Small sips/bites Postural Changes and/or Swallow Maneuvers: Seated upright 90 degrees              Oral Care Recommendations: Oral care BID Follow up Recommendations: Skilled Nursing facility Plan: Continue with current plan of care    GO     Royce Macadamia M.Ed [employer]  [phone]  08/09/2013

## 2013-08-09 NOTE — Progress Notes (Signed)
Stroke Team Progress Note  HISTORY [name] is a 67 y.o. male with a history of a right MCA stroke who presents with altered mental status. He was last seen normal last night prior to going to bed. He arrived in the ER around 10 AM. He has a history of bipolar disorder as well as multiple sclerosis and takes Depakote for the bipolar disorder.    In the ER he is unable to answer questions and chest x-ray revealed pneumonia versus atelectasis. CT of his brain was negative. In the emergency room he vomited and aspirated. He was intubated secondary to this event.   LKW: 11/27 prior to going to bed  tpa given?: no, outside of window   08/09/2013: Dr. Corliss Skains S/P 4 vessle cerebral arteriogram.  RT CFA approach.  Findings 1.(5 % stenosis RT ICA prox.  2.90% stenosis of both Vert artery origins  3.70%stenosis of RT ICA distal cervical segment.  Over past several days he has had improved mental status, but has severe dysarthria, left tongue deviation and mild right hemiparesis. Transferred to stroke service.   SUBJECTIVE No acute events overnight.  This morning, the patient still has difficulty expressing answers to questions.  IR procedure yesterday showed bilateral stenosis.     OBJECTIVE Most recent Vital Signs: Filed Vitals:   08/09/13 0910 08/09/13 0915 08/09/13 0920 08/09/13 1000  BP: 132/91 137/96 132/92 134/94  Pulse: 61 66 63 62  Temp:    98 F (36.7 C)  TempSrc:    Oral  Resp: 15 14 15 16   Height:      Weight:      SpO2: 99% 98% 99% 98%   CBG (last 3)   Recent Labs  08/08/13 0746 08/08/13 1318 08/08/13 1615  GLUCAP 84 98 82    IV Fluid Intake:   . dextrose 5 % and 0.45% NaCl 50 mL/hr at 08/07/13 1400  . propofol Stopped (08/06/13 0865)    MEDICATIONS  . atorvastatin  40 mg Oral Daily  . chlorhexidine  15 mL Mouth/Throat Q12H  . clopidogrel  75 mg Oral Daily  . famotidine  20 mg Oral Daily  . fentaNYL      . heparin  5,000 Units Subcutaneous Q8H  .  insulin aspart  0-9 Units Subcutaneous TID WC  . midazolam      . piperacillin-tazobactam (ZOSYN)  IV  3.375 g Intravenous Q8H  . sodium chloride  3 mL Intravenous Q12H   PRN:  acetaminophen, acetaminophen, albuterol, ipratropium, metoprolol, ondansetron (ZOFRAN) IV, ondansetron  Diet:  NPO Activity:   Up with assistance  CLINICALLY SIGNIFICANT STUDIES Basic Metabolic Panel:  Recent Labs Lab 08/07/13 0419 08/08/13 0352 08/09/13 0416  NA 138 139 136  K 3.6 3.8 3.6  CL 104 105 104  CO2 22 22 23   GLUCOSE 73 69* 127*  BUN 19 18 16   CREATININE 0.87 0.87 0.98  CALCIUM 8.3* 8.4 8.4  MG 2.0 1.9  --   PHOS 2.7 2.2*  --    Liver Function Tests:  Recent Labs Lab 08/05/13 1053 08/05/13 1645  AST 19 24  ALT 9 9  ALKPHOS 84 84  BILITOT 0.4 0.8  PROT 7.4 7.3  ALBUMIN 2.7* 2.8*   CBC:  Recent Labs Lab 08/05/13 1053  08/06/13 0310  08/08/13 0352 08/09/13 0416  WBC 13.8*  --  12.6*  < > 7.3 7.0  NEUTROABS 12.4*  --  10.2*  --   --   --   HGB 12.6*  < >  12.1*  < > 11.0* 10.3*  HCT 37.2*  < > 36.5*  < > 32.5* 31.3*  MCV 97.9  --  99.7  < > 98.5 98.7  PLT 243  --  215  < > 216 213  < > = values in this interval not displayed. Coagulation:  Recent Labs Lab 08/05/13 1053  LABPROT 14.0  INR 1.10   Cardiac Enzymes:  Recent Labs Lab 08/05/13 1349 08/05/13 1627 08/05/13 2235 08/06/13 0310  CKTOTAL 92  --   --   --   TROPONINI  --  <0.30 <0.30 <0.30   Urinalysis:  Recent Labs Lab 08/05/13 1124  COLORURINE YELLOW  LABSPEC 1.022  PHURINE 6.5  GLUCOSEU NEGATIVE  HGBUR NEGATIVE  BILIRUBINUR NEGATIVE  KETONESUR NEGATIVE  PROTEINUR 30*  UROBILINOGEN 1.0  NITRITE NEGATIVE  LEUKOCYTESUR NEGATIVE   Lipid Panel    Component Value Date/Time   CHOL 155 08/08/2013 0352   TRIG 86 08/08/2013 0352   HDL 64 08/08/2013 0352   CHOLHDL 2.4 08/08/2013 0352   VLDL 17 08/08/2013 0352   LDLCALC 74 08/08/2013 0352   HgbA1C  Lab Results  Component Value Date   HGBA1C 5.6  08/07/2013    Urine Drug Screen:     Component Value Date/Time   LABOPIA NONE DETECTED 08/05/2013 1124   COCAINSCRNUR NONE DETECTED 08/05/2013 1124   LABBENZ NONE DETECTED 08/05/2013 1124   AMPHETMU NONE DETECTED 08/05/2013 1124   THCU NONE DETECTED 08/05/2013 1124   LABBARB NONE DETECTED 08/05/2013 1124    Alcohol Level:  Recent Labs Lab 08/05/13 1053  ETH <11    Ct Head Wo Contrast 08/07/2013   1. 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 Head CT is unchanged from 08/05/2013. 2. Diffuse cerebral atrophy and chronic white matter ischemic change. 3. Soft tissue densities in the external auditory canals bilaterally. This is most likely cerumen, however direct visualization is suggested.     Dg Swallowing Func-speech Pathology 08/08/2013   Breck Coons [place], CCC-SLP     08/08/2013  1:49 PM Objective Swallowing Evaluation: Modified Barium Swallowing Study   Patient Details  Name: [name] MRN: [id number] Date of Birth: [birth year]  Today's Date: 08/08/2013 Time: 1130-1150 SLP Time Calculation (min): 20 min  Past Medical History:  Past Medical History  Diagnosis Date  . Coronary artery disease     s/p PCI of LAD with BMS 04/03/06 at [facility]  . Hyperlipidemia   . Hypertension   . Cerebrovascular accident     history of cerebrovascular accident with residual left  hemiparesis in 2002, seen at [facility]  . Multiple sclerosis   . Ischemic cardiomyopathy     s/p BiV ICD implanted 2007 at [facility]  . Chronic systolic congestive heart failure   . Bipolar disorder    Past Surgical History:  Past Surgical History  Procedure Laterality Date  . Tonsillectomy    . Vasectomy    . Implant of automatic cardioverter defibrillator      December 26, 2004 MDT BiV ICD implanted by Dr Chales Abrahams in [facility]  . Ptca      PCI LAD with BMS 04/03/06   HPI:  67 yo male adm to [facility] with AMS, severe dysarthria, has h/o right  MCA CVA.  Pt with episode of emesis and required  intubation  11/28-11/29.  Bedside swallow evaluation recommended an MBS.       Assessment / Plan / Recommendation Clinical Impression  Dysphagia Diagnosis: Mild  pharyngeal phase dysphagia Clinical impression: Pt. exhibited min-mild sensory based  pharyngeal dysphagia.  Initiation of swallow was delayed to pt.'s  pyriform sinuses with liquid consistencies caused by decreased  sensation.  Protection of laryngeal vestibule and trachea was  adequate without penetration or aspiration observed.  Pharyngeal  residue was min-mild and intermittent.  Aspiration risk appears  mild per clinical observations during study, however pt.'s  awareness and cognitive status appears decreased.  SLP recommends  a regular texture diet and thin liquids, straw ok, sit upright,  small sips and pills whole in applesauce. ST will continue to see  for safety with recommendations.     Treatment Recommendation  Therapy as outlined in treatment plan below    Diet Recommendation Thin liquid;Regular   Liquid Administration via: Cup;Straw Medication Administration: Whole meds with puree Supervision: Patient able to self feed;Full supervision/cueing  for compensatory strategies Compensations: Slow rate;Small sips/bites Postural Changes and/or Swallow Maneuvers: Seated upright 90  degrees    Other  Recommendations Oral Care Recommendations: Oral care BID   Follow Up Recommendations  None    Frequency and Duration min 2x/week  2 weeks   Pertinent Vitals/Pain No pain            Reason for Referral Objectively evaluate swallowing function   Oral Phase Oral Preparation/Oral Phase Oral Phase: WFL   Pharyngeal Phase Pharyngeal Phase Pharyngeal Phase: Impaired Pharyngeal - Honey Pharyngeal - Honey Teaspoon: Delayed swallow initiation;Premature  spillage to pyriform sinuses Pharyngeal - Honey Cup: Delayed swallow initiation;Premature  spillage to pyriform sinuses;Pharyngeal residue -  valleculae;Reduced tongue base retraction Pharyngeal - Nectar Pharyngeal -  Nectar Teaspoon: Delayed swallow  initiation;Premature spillage to pyriform sinuses;Pharyngeal  residue - valleculae;Reduced tongue base retraction Pharyngeal - Nectar Cup: Delayed swallow initiation;Premature  spillage to pyriform sinuses;Pharyngeal residue -  valleculae;Reduced tongue base retraction Pharyngeal - Thin Pharyngeal - Thin Teaspoon: Premature spillage to pyriform  sinuses;Delayed swallow initiation;Pharyngeal residue -  valleculae;Reduced tongue base retraction Pharyngeal - Thin Cup: Delayed swallow initiation;Premature  spillage to pyriform sinuses;Pharyngeal residue -  valleculae;Reduced tongue base retraction Pharyngeal - Thin Straw: Delayed swallow initiation;Premature  spillage to pyriform sinuses;Pharyngeal residue -  valleculae;Reduced tongue base retraction Pharyngeal - Solids Pharyngeal - Puree: Within functional limits Pharyngeal - Regular: Within functional limits  Cervical Esophageal Phase    GO    Cervical Esophageal Phase Cervical Esophageal Phase: [facility]         Darrow Bussing.Ed CCC-SLP Pager [phone]  08/08/2013    EEG  This EEG is consistent with a right occipital lobe dysfunction likely secondary to patient's previous stroke. There was no seizure or seizure predisposition recorded on this study  08/09/2013: Dr. Corliss Skains S/P 4 vessle cerebral arteriogram.  RT CFA approach.  Findings 1.(5 % stenosis RT ICA prox.  2.90% stenosis of both Vert artery origins  3.70%stenosis of RT ICA distal cervical segment  MRI of the brain ICD  MRA of the brain  ICD  2D Echocardiogram  EF 20% with diffuse HK, Left atrium mod-severely dilated. No cardiac source of embolism identified.                                    (previous echo, 10/2011 showed EF 25%)  Carotid Doppler see angio   CXR    EKG  normal sinus rhythm.   Therapy Recommendations SNF  Physical Exam   General: In bed, intubated, slightly cachectic  CV: Regular rate and rhythm  Mental Status:  Patient does  not open eyes or follow commands.  Cranial Nerves:  II: Does not blink to threat. Pupils are equal, round, and reactive to light. Discs are difficult to visualize.  III,IV, VI: No clear reaction to doll's eye, but eyes are conjugate.  V/VII: Corneals are intact  VIII, X, XI, XII: Unable to assess secondary to patient's altered mental status.  Motor:  Tone is normal. He does withdraw to noxious stimuli bilaterally and arms and legs  Sensory:  As above  Deep Tendon Reflexes:  2+ and symmetric in the biceps and patellae.  Cerebellar:  Unable to assess secondary to patient's altered mental status.  Gait:  Unable to assess secondary to patient's altered mental status.    ASSESSMENT [name] is a 67 y.o. male presenting with altered mental status. Imaging confirms an old right temporoparietal infarct. The patient has multivessel disease, and it is unclear which one vessel plays the largest role in his current stroke.  On no antithrombotics prior to admission. Now on clopidogrel 75 mg orally every day for secondary stroke prevention. Work up underway.   Will observe for now to see if mental status improves.  Right now, pt unable to understand disease and treatment options sufficiently to make an informed decision  Hospital day # 4  TREATMENT/PLAN  Continue clopidogrel 75 mg orally every day for secondary stroke prevention.  PT/OT/SLP eval  Consider vascular intervention vs supportive care.  Will see if mental status shows signs of improvement  Janalyn Harder, pgy3 08/09/2013, 5:20 PM

## 2013-08-09 NOTE — Progress Notes (Signed)
SLP Cancellation Note  Patient Details Name: RONNELL CLINGER MRN: [id number] DOB: [birth year]   Cancelled treatment:        Pt. has to remain flat for 4 hours post angiogram which ended at 0922.  Will attempt to return if schedule allows today.  If not, will see pt. Tomorrow for safety with recommendations following yesterday's MBS.   Breck Coons [place].Ed [employer] [phone]  08/09/2013

## 2013-08-09 NOTE — Progress Notes (Addendum)
INITIAL NUTRITION ASSESSMENT  DOCUMENTATION CODES Per approved criteria  -Severe malnutrition in the context of chronic illness   INTERVENTION: - No nutrition supplements at this time. RD will monitor intake and add supplements if necessary.  - Encourage adequate intake of meals.   NUTRITION DIAGNOSIS: Malnutrition related to history of poor oral intake as evidenced by severe fat and muscle wasting.   Goal: Patient will meet >/=90% of estimated nutrition needs.  Monitor:  PO intake, weight, labs, I/Os  Reason for Assessment: Low Braden score  67 y.o. male  Admitting Dx: Acute encephalopathy  ASSESSMENT: Patient with history of coronary artery disease, CVA with residual left hemiparesis, multiple sclerosis, and CHF. Admitted with altered mental status. He was intubated for airway protection, extubated 11/29. Patient is dysarthric, but does speak.   He had a MBS 12/1, mild dysphagia, SLP recommends regular diet with thin liquids. Patient's diet just advanced today after angiogram. SLP in with patient at time of visit. Diet downgraded to Dysphagia 3.  Patient frustrated regarding food. He has not eaten since last Thursday, stating "I just want food." Unable to obtain recent history of intake, but patient with visible severe muscle wasting at the shoulder and lower body and fat wasting at the upper arm and thoracic/lumbar region and he has lost 20 pounds over the last few years per chart review.   Patient meets the criteria for severe MALNUTRITION in the context of chronic illness with severe fat and muscle tissue wasting.   Height: Ht Readings from Last 1 Encounters:  08/09/13 5\' 6"  (1.676 m)    Weight: Wt Readings from Last 1 Encounters:  08/09/13 119 lb 11.2 oz (54.296 kg)    Ideal Body Weight: 142 pounds  % Ideal Body Weight: 84%  Wt Readings from Last 10 Encounters:  08/09/13 119 lb 11.2 oz (54.296 kg)  06/30/12 135 lb (61.236 kg)  11/05/11 128 lb (58.06 kg)   08/04/11 141 lb (63.957 kg)  04/23/11 140 lb (63.504 kg)  05/08/06 170 lb (77.111 kg)    Usual Body Weight: 130 pounds  % Usual Body Weight: 92%  BMI:  Body mass index is 19.33 kg/(m^2). Patient is normal weight  Estimated Nutritional Needs: Kcal: 1650-1800 kcal Protein: 75-85 g Fluid: >1.6 L/day  Skin: Intact  Diet Order: Dysphagia 3, thin  EDUCATION NEEDS: -No education needs identified at this time   Intake/Output Summary (Last 24 hours) at 08/09/13 1350 Last data filed at 08/08/13 2128  Gross per 24 hour  Intake      0 ml  Output    500 ml  Net   -500 ml    Last BM: PTA   Labs:   Recent Labs Lab 08/06/13 0310 08/07/13 0419 08/08/13 0352 08/09/13 0416  NA 136 138 139 136  K 3.8 3.6 3.8 3.6  CL 101 104 105 104  CO2 24 22 22 23   BUN 20 19 18 16   CREATININE 0.80 0.87 0.87 0.98  CALCIUM 8.5 8.3* 8.4 8.4  MG 1.9 2.0 1.9  --   PHOS 3.5 2.7 2.2*  --   GLUCOSE 102* 73 69* 127*    CBG (last 3)   Recent Labs  08/08/13 1318 08/08/13 1615 08/09/13 1113  GLUCAP 98 82 91    Scheduled Meds: . atorvastatin  40 mg Oral Daily  . chlorhexidine  15 mL Mouth/Throat Q12H  . clopidogrel  75 mg Oral Daily  . famotidine  20 mg Oral Daily  . fentaNYL      .  heparin  5,000 Units Subcutaneous Q8H  . insulin aspart  0-9 Units Subcutaneous TID WC  . midazolam      . piperacillin-tazobactam (ZOSYN)  IV  3.375 g Intravenous Q8H  . sodium chloride  3 mL Intravenous Q12H    Continuous Infusions: . sodium chloride    . dextrose 5 % and 0.45% NaCl 50 mL/hr at 08/07/13 1400  . propofol Stopped (08/06/13 1478)    Past Medical History  Diagnosis Date  . Coronary artery disease     s/p PCI of LAD with BMS 04/03/06 at [facility]  . Hyperlipidemia   . Hypertension   . Cerebrovascular accident     history of cerebrovascular accident with residual left hemiparesis in 2002, seen at [facility]  . Multiple sclerosis   . Ischemic cardiomyopathy      s/p BiV ICD implanted 2007 at [facility]  . Chronic systolic congestive heart failure   . Bipolar disorder     Past Surgical History  Procedure Laterality Date  . Tonsillectomy    . Vasectomy    . Implant of automatic cardioverter defibrillator      December 26, 2004 MDT BiV ICD implanted by Dr Chales Abrahams in [facility]  . Ptca      PCI LAD with BMS 04/03/06    Linnell Fulling, RD, LDN Pager #: [phone] After-Hours Pager #: [phone]

## 2013-08-09 NOTE — Progress Notes (Signed)
Subjective: Yesterday, [name] had a speech study performed that recommended a regular diet and thin liquids.    Overnight, no acute events. Speech is dysarthric today, but patient believes his strength is improving. Patient desires to eat this morning after his angiogram.  Objective: Lab:  Recent Labs  08/07/13 0419 08/08/13 0352 08/09/13 0416  WBC 8.2 7.3 7.0  HGB 11.0* 11.0* 10.3*  HCT 32.5* 32.5* 31.3*  MCV 98.5 98.5 98.7  PLT 186 216 213    Recent Labs  08/07/13 0419 08/08/13 0352 08/09/13 0416  NA 138 139 136  K 3.6 3.8 3.6  CL 104 105 104  GLUCOSE 73 69* 127*  BUN 19 18 16   CREATININE 0.87 0.87 0.98  CALCIUM 8.3* 8.4 8.4  MG 2.0 1.9  --   PHOS 2.7 2.2*  --     Imaging: 11/30 CT angio IMPRESSION:  CTA NECK FINDINGS:  1. Chronic bilateral carotid bifurcation atherosclerosis, on the  right plaque is complex with bulky soft plaque component resulting  in chronic high-grade right ICA stenosis, radiographic string sign.  2. Stable chronic bilateral cervical ICA pseudoaneurysms, mostly  partially thrombosis. No subsequent additional significant cervical  ICA stenosis.  3. Chronic plaque at both vertebral artery origins appears stable to  increased and likely is hemodynamically significant bilaterally.  Both vertebral arteries remain patent throughout the neck, as before  the left is mildly dominant.  4. Abnormal tree-in-bud right upper lobe pulmonary opacity  compatible with acute distal airway infection, superimposed on  emphysema.  CTA HEAD FINDINGS:  1. Stable CT appearance of the brain with chronic right MCA infarct  and advanced white matter changes.  2. Stable and largely unremarkable intracranial CTA; calcified  atherosclerotic plaque which is not hemodynamically significant, and  no major circle of Willis branch occlusion.  .  Scheduled Meds: . atorvastatin  40 mg Oral Daily  . chlorhexidine  15 mL Mouth/Throat Q12H  . clopidogrel  75 mg Oral  Daily  . famotidine (PEPCID) IV  20 mg Intravenous Q24H  . heparin  5,000 Units Subcutaneous Q8H  . insulin aspart  0-9 Units Subcutaneous TID WC  . piperacillin-tazobactam (ZOSYN)  IV  3.375 g Intravenous Q8H  . sodium chloride  3 mL Intravenous Q12H   Continuous Infusions: . dextrose 5 % and 0.45% NaCl 50 mL/hr at 08/07/13 1400  . propofol Stopped (08/06/13 0742)   PRN Meds:.acetaminophen, acetaminophen, albuterol, ipratropium, metoprolol, ondansetron (ZOFRAN) IV, ondansetron   Physical Exam: Filed Vitals:   08/09/13 0552  BP: 140/93  Pulse: 71  Temp: 98.6 F (37 C)  Resp: 19   Gen: Ill appearing man in no acute distress HEENT: Temporal wasting. Pupils equal and reactive bilaterally. EOM intact without nystagmus. Left tongue deviation. CV: Distant heart sounds. Regular rate and rhythm. No murmur, rubs or gallops. 2+ radial and dorsalis pedis pulses bilaterally. Pulm: Normal work of breathing. No rales, wheezes or rhonchi.  Abd: Soft, non-tender, and non-distended. Bowel sounds present.  Neuro: AOx3. Dysarthric. LUE 5/5. RUE 4+/5. LLE 5/5. RLE: 4+/5. Sensation to light touch intact bilaterally in upper and lower extremities.    Assessment/Plan: [name] is a 67 yo male with PMH of previous right MCA stroke who was found confused and brought into the ER, where he aspirated and was intubated. Following this event, he was found to have R hemiparesis and a new left tongue deviation concerning for an new stroke.   1) Neuro: His new tongue deviation and R hemiparesis is  concerning for development of a new stroke. Initial CT scan was negative for an acute stroke. High grade lesion right ICQ with string sign - may be culprit vessel for embolic stroke(s) Plan: Catheter angiogram today to further evaluate carotids and for possible intervention            Continue plavix           PT/OT/ST  2) Cardiac: Troponins normal. [place] [address]. Echocardiogram demonstrated significant  cardiomyopathy - EF 20% Plan: Monitor for heat failure  3) Infectious disease: Probable aspiration pneumonia with abnormal CXr. Patient is afebrile with no leukocytosis. Day #4/7 of Zosyn.   4) F/E/N: Electrolytes and renal function remain stable.  Plan: Patient able to eat regular diet with thin liquids per speech's recommendations.   5) DM -  CBG (last 3)   Recent Labs  08/08/13 0746 08/08/13 1318 08/08/13 1615  GLUCAP 84 98 82   Continue basal insulin and sliding scale.    Code status: No CPR, no reintubation.   Dispo - pending PT/OT eval

## 2013-08-10 DIAGNOSIS — E43 Unspecified severe protein-calorie malnutrition: Secondary | ICD-10-CM

## 2013-08-10 DIAGNOSIS — I69922 Dysarthria following unspecified cerebrovascular disease: Secondary | ICD-10-CM

## 2013-08-10 LAB — GLUCOSE, CAPILLARY: Glucose-Capillary: 154 mg/dL — ABNORMAL HIGH (ref 70–99)

## 2013-08-10 MED ORDER — ASPIRIN EC 81 MG PO TBEC
81.0000 mg | DELAYED_RELEASE_TABLET | Freq: Every day | ORAL | Status: DC
  Administered 2013-08-10 – 2013-08-12 (×3): 81 mg via ORAL
  Filled 2013-08-10 (×3): qty 1

## 2013-08-10 NOTE — Progress Notes (Signed)
Stroke Team Progress Note  HISTORY [name] is a 67 y.o. male with a history of a right MCA stroke who presents with altered mental status. He was last seen normal last night prior to going to bed. He arrived in the ER around 10 AM. He has a history of bipolar disorder as well as multiple sclerosis and takes Depakote for the bipolar disorder.    In the ER he is unable to answer questions and chest x-ray revealed pneumonia versus atelectasis. CT of his brain was negative. In the emergency room he vomited and aspirated. He was intubated secondary to this event.   LKW: 11/27 prior to going to bed  tpa given?: no, outside of window   08/09/2013: Dr. Corliss Skains S/P 4 vessle cerebral arteriogram.  RT CFA approach.  Findings 1.(5 % stenosis RT ICA prox.  2.90% stenosis of both Vert artery origins  3.70%stenosis of RT ICA distal cervical segment.  Over past several days he has had improved mental status, but has severe dysarthria, left tongue deviation and mild right hemiparesis. Transferred to stroke service.   SUBJECTIVE Patient sitting in chair, more alert and communicative today.  Expressive aphasia remains his largest symptom.  Also some right leg weakness and sensory loss.  Spoke with patient's son over the phone, who wants to proceed with vascular intervention.  OBJECTIVE Most recent Vital Signs: Filed Vitals:   08/09/13 2127 08/10/13 0141 08/10/13 0557 08/10/13 1021  BP: 108/67 117/72 117/73 127/82  Pulse: 69 69 61 69  Temp: 98.2 F (36.8 C) 98.7 F (37.1 C) 97.1 F (36.2 C) 98 F (36.7 C)  TempSrc: Oral Oral Axillary Oral  Resp: 18 18 16 18   Height:      Weight:    161 lb 1.6 oz (73.074 kg)  SpO2: 100% 99% 99% 97%   CBG (last 3)   Recent Labs  08/09/13 1631 08/09/13 2130 08/10/13 0643  GLUCAP 159* 112* 91    IV Fluid Intake:   . dextrose 5 % and 0.45% NaCl 50 mL/hr at 08/09/13 2038  . propofol Stopped (08/06/13 1610)    MEDICATIONS  . atorvastatin  40 mg  Oral Daily  . chlorhexidine  15 mL Mouth/Throat Q12H  . clopidogrel  75 mg Oral Daily  . famotidine  20 mg Oral Daily  . heparin  5,000 Units Subcutaneous Q8H  . insulin aspart  0-9 Units Subcutaneous TID WC  . piperacillin-tazobactam (ZOSYN)  IV  3.375 g Intravenous Q8H  . sodium chloride  3 mL Intravenous Q12H   PRN:  acetaminophen, acetaminophen, albuterol, ipratropium, metoprolol, ondansetron (ZOFRAN) IV, ondansetron  Diet:  Dysphagia 3 with thin liquids Activity:   Up with assistance DVT prophy: Heparin  CLINICALLY SIGNIFICANT STUDIES Basic Metabolic Panel:   Recent Labs Lab 08/07/13 0419 08/08/13 0352 08/09/13 0416  NA 138 139 136  K 3.6 3.8 3.6  CL 104 105 104  CO2 22 22 23   GLUCOSE 73 69* 127*  BUN 19 18 16   CREATININE 0.87 0.87 0.98  CALCIUM 8.3* 8.4 8.4  MG 2.0 1.9  --   PHOS 2.7 2.2*  --    Liver Function Tests:   Recent Labs Lab 08/05/13 1053 08/05/13 1645  AST 19 24  ALT 9 9  ALKPHOS 84 84  BILITOT 0.4 0.8  PROT 7.4 7.3  ALBUMIN 2.7* 2.8*   CBC:  Recent Labs Lab 08/05/13 1053  08/06/13 0310  08/08/13 0352 08/09/13 0416  WBC 13.8*  --  12.6*  < >  7.3 7.0  NEUTROABS 12.4*  --  10.2*  --   --   --   HGB 12.6*  < > 12.1*  < > 11.0* 10.3*  HCT 37.2*  < > 36.5*  < > 32.5* 31.3*  MCV 97.9  --  99.7  < > 98.5 98.7  PLT 243  --  215  < > 216 213  < > = values in this interval not displayed. Coagulation:   Recent Labs Lab 08/05/13 1053  LABPROT 14.0  INR 1.10   Cardiac Enzymes:   Recent Labs Lab 08/05/13 1349 08/05/13 1627 08/05/13 2235 08/06/13 0310  CKTOTAL 92  --   --   --   TROPONINI  --  <0.30 <0.30 <0.30   Urinalysis:   Recent Labs Lab 08/05/13 1124  COLORURINE YELLOW  LABSPEC 1.022  PHURINE 6.5  GLUCOSEU NEGATIVE  HGBUR NEGATIVE  BILIRUBINUR NEGATIVE  KETONESUR NEGATIVE  PROTEINUR 30*  UROBILINOGEN 1.0  NITRITE NEGATIVE  LEUKOCYTESUR NEGATIVE   Lipid Panel    Component Value Date/Time   CHOL 155 08/08/2013  0352   TRIG 86 08/08/2013 0352   HDL 64 08/08/2013 0352   CHOLHDL 2.4 08/08/2013 0352   VLDL 17 08/08/2013 0352   LDLCALC 74 08/08/2013 0352   HgbA1C  Lab Results  Component Value Date   HGBA1C 5.6 08/07/2013    Urine Drug Screen:     Component Value Date/Time   LABOPIA NONE DETECTED 08/05/2013 1124   COCAINSCRNUR NONE DETECTED 08/05/2013 1124   LABBENZ NONE DETECTED 08/05/2013 1124   AMPHETMU NONE DETECTED 08/05/2013 1124   THCU NONE DETECTED 08/05/2013 1124   LABBARB NONE DETECTED 08/05/2013 1124    Alcohol Level:   Recent Labs Lab 08/05/13 1053  ETH <11    Ct Head Wo Contrast 08/07/2013   1. 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 Head CT is unchanged from 08/05/2013. 2. Diffuse cerebral atrophy and chronic white matter ischemic change. 3. Soft tissue densities in the external auditory canals bilaterally. This is most likely cerumen, however direct visualization is suggested.     Dg Swallowing Func-speech Pathology 08/08/2013   Breck Coons [place], CCC-SLP     08/08/2013  1:49 PM Objective Swallowing Evaluation: Modified Barium Swallowing Study   Patient Details  Name: [name] MRN: [id number] Date of Birth: 1946/08/13  Today's Date: 08/08/2013 Time: 1130-1150 SLP Time Calculation (min): 20 min  Past Medical History:  Past Medical History  Diagnosis Date  . Coronary artery disease     s/p PCI of LAD with BMS 04/03/06 at [facility]  . Hyperlipidemia   . Hypertension   . Cerebrovascular accident     history of cerebrovascular accident with residual left  hemiparesis in 2002, seen at [facility]  . Multiple sclerosis   . Ischemic cardiomyopathy     s/p BiV ICD implanted 2007 at [facility]  . Chronic systolic congestive heart failure   . Bipolar disorder    Past Surgical History:  Past Surgical History  Procedure Laterality Date  . Tonsillectomy    . Vasectomy    . Implant of automatic cardioverter defibrillator      December 26, 2004 MDT BiV  ICD implanted by Dr Chales Abrahams in [facility]  . Ptca      PCI LAD with BMS 04/03/06   HPI:  67 yo male adm to [facility] with AMS, severe dysarthria, has h/o right  MCA CVA.  Pt with episode of emesis and  required intubation  11/28-11/29.  Bedside swallow evaluation recommended an MBS.       Assessment / Plan / Recommendation Clinical Impression  Dysphagia Diagnosis: Mild pharyngeal phase dysphagia Clinical impression: Pt. exhibited min-mild sensory based  pharyngeal dysphagia.  Initiation of swallow was delayed to pt.'s  pyriform sinuses with liquid consistencies caused by decreased  sensation.  Protection of laryngeal vestibule and trachea was  adequate without penetration or aspiration observed.  Pharyngeal  residue was min-mild and intermittent.  Aspiration risk appears  mild per clinical observations during study, however pt.'s  awareness and cognitive status appears decreased.  SLP recommends  a regular texture diet and thin liquids, straw ok, sit upright,  small sips and pills whole in applesauce. ST will continue to see  for safety with recommendations.     Treatment Recommendation  Therapy as outlined in treatment plan below    Diet Recommendation Thin liquid;Regular   Liquid Administration via: Cup;Straw Medication Administration: Whole meds with puree Supervision: Patient able to self feed;Full supervision/cueing  for compensatory strategies Compensations: Slow rate;Small sips/bites Postural Changes and/or Swallow Maneuvers: Seated upright 90  degrees    Other  Recommendations Oral Care Recommendations: Oral care BID   Follow Up Recommendations  None    Frequency and Duration min 2x/week  2 weeks   Pertinent Vitals/Pain No pain            Reason for Referral Objectively evaluate swallowing function   Oral Phase Oral Preparation/Oral Phase Oral Phase: WFL   Pharyngeal Phase Pharyngeal Phase Pharyngeal Phase: Impaired Pharyngeal - Honey Pharyngeal - Honey Teaspoon: Delayed swallow initiation;Premature  spillage to  pyriform sinuses Pharyngeal - Honey Cup: Delayed swallow initiation;Premature  spillage to pyriform sinuses;Pharyngeal residue -  valleculae;Reduced tongue base retraction Pharyngeal - Nectar Pharyngeal - Nectar Teaspoon: Delayed swallow  initiation;Premature spillage to pyriform sinuses;Pharyngeal  residue - valleculae;Reduced tongue base retraction Pharyngeal - Nectar Cup: Delayed swallow initiation;Premature  spillage to pyriform sinuses;Pharyngeal residue -  valleculae;Reduced tongue base retraction Pharyngeal - Thin Pharyngeal - Thin Teaspoon: Premature spillage to pyriform  sinuses;Delayed swallow initiation;Pharyngeal residue -  valleculae;Reduced tongue base retraction Pharyngeal - Thin Cup: Delayed swallow initiation;Premature  spillage to pyriform sinuses;Pharyngeal residue -  valleculae;Reduced tongue base retraction Pharyngeal - Thin Straw: Delayed swallow initiation;Premature  spillage to pyriform sinuses;Pharyngeal residue -  valleculae;Reduced tongue base retraction Pharyngeal - Solids Pharyngeal - Puree: Within functional limits Pharyngeal - Regular: Within functional limits  Cervical Esophageal Phase    GO    Cervical Esophageal Phase Cervical Esophageal Phase: [facility]         Darrow Bussing.Ed CCC-SLP Pager [phone]  08/08/2013    EEG  This EEG is consistent with a right occipital lobe dysfunction likely secondary to patient's previous stroke. There was no seizure or seizure predisposition recorded on this study  08/09/2013: Dr. Corliss Skains S/P 4 vessle cerebral arteriogram.  RT CFA approach.  Findings 1.(5 % stenosis RT ICA prox.  2.90% stenosis of both Vert artery origins  3.70%stenosis of RT ICA distal cervical segment  2D Echocardiogram  EF 20% with diffuse HK, Left atrium mod-severely dilated. No cardiac source of embolism identified.                                    (previous echo, 10/2011 showed EF 25%)  Carotid Doppler see angio   CXR  Patchy densities in R lung base  suggestive for  PNA/aspiration  EKG  normal sinus rhythm.   Therapy Recommendations SNF  Physical Exam   General: In bed, intubated, slightly cachectic  CV: Regular rate and rhythm  Mental Status:  Patient is awake and interactive today. Has moderate dysarthria but can be understood with some difficulty. Able to answer names and follows simple commands. Not able to give me his son's telephone number. Cranial Nerves:  II: Does not blink to threat. Pupils are equal, round, and reactive to light. Discs are difficult to visualize.  III,IV, VI: No clear reaction to doll's eye, but eyes are conjugate.  V/VII: Corneals are intact  VIII, X, XI, XII: Unable to assess secondary to patient's altered mental status.  Motor:  Tone is normal. He does withdraw to noxious stimuli bilaterally and arms and legs mild weakness of the right leg. Sensory:  As above  Deep Tendon Reflexes:  2+ and symmetric in the biceps and patellae.  Cerebellar:  Unable to assess secondary to patient's altered mental status.  Gait:  Unable to assess secondary to patient's altered mental status.    ASSESSMENT Mr. REMI LOPATA is a 67 y.o. male presenting with dysarthria and mild right leg weakness likely due to a small left brain stem infarct not visualized on CT scan and MRI cannot be done because of pacemaker Imaging confirms an old right temporoparietal infarct. 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 He has clearly had a remote large right brain stroke 7 years ago and has not had any recent right hemispheric ischemic symptoms hence I believe right carotid revascularization and wait  On no antithrombotics prior to admission. Now on clopidogrel 75 mg orally every day for secondary stroke prevention. Work up underway.   Hospital day #  5  TREATMENT/PLAN  Continue clopidogrel 75 mg orally every day for secondary stroke prevention.  Continue Atorvastatin 40 daily for secondary stroke prevention  Awaiting PT/OT eval  Patient's son desires treatment of patient's vascular disease  Per primary team, plan for discharge to SNF when available  Telephonic discussion with patient's son in [place] about his stroke, results of cerebral catheter angiogram and treatment options including medical therapy alone versus angioplasty stenting. He prefers left vertebral artery origin angioplasty and stenting at the present time. Patient is likely going to go to skilled nursing facility for rehabilitation. He may move to the [place] area to be close to his son and further neurovascular revascularization may have to be done there.  Janalyn Harder, PGY3 08/10/2013, 11:31 AM I have personally examined this patient, reviewed data, and about plan of care and agree with the above Delia Heady, MD

## 2013-08-10 NOTE — Progress Notes (Signed)
Physical Therapy Treatment Patient Details Name: [name] MRN: [id number] DOB: 06-20-1946 Today's Date: 08/10/2013 Time: 8295-6213 PT Time Calculation (min): 26 min  PT Assessment / Plan / Recommendation  History of Present Illness Pt adm with AMS and resp failure. Work-up in progress.   PT Comments   Patient demonstrates improvements in function today, able to tolerated EOB activities including there ex. Patient also able to ambulate with increased assist a few steps to chair. Will continue to see and progress activity as tolerated.   Follow Up Recommendations  SNF     Does the patient have the potential to tolerate intense rehabilitation     Barriers to Discharge        Equipment Recommendations  Other (comment) (TBD)    Recommendations for Other Services    Frequency Min 2X/week   Progress towards PT Goals Progress towards PT goals: Goals met and updated - see care plan  Plan Current plan remains appropriate    Precautions / Restrictions Precautions Precautions: Fall Restrictions Weight Bearing Restrictions: No   Pertinent Vitals/Pain No value given    Mobility  Bed Mobility Bed Mobility: Supine to Sit;Sitting - Scoot to Edge of Bed Supine to Sit: 3: Mod assist Sitting - Scoot to Edge of Bed: 4: Min assist Details for Bed Mobility Assistance: VCs for positioning, assist to elevate to sitting, assit to rotate trunk to EOB. Transfers Transfers: Sit to Stand;Stand to Sit Sit to Stand: 3: Mod assist Stand to Sit: 3: Mod assist Details for Transfer Assistance: Assist to elevate to standing, assist for stability, poor control of descent to chair Ambulation/Gait Ambulation/Gait Assistance: 1: +2 Total assist Ambulation/Gait: Patient Percentage: 60% Ambulation Distance (Feet): 6 Feet Assistive device:  (used back of chair for UE support, bilateral support from PT) Ambulation/Gait Assistance Details: assist for upright posture and stability, VCs for step initiation  and positioning    Exercises General Exercises - Lower Extremity Ankle Circles/Pumps: AROM;Both;10 reps Long Arc Quad: AROM;Both;10 reps Hip Flexion/Marching: AROM;Both;10 reps      PT Goals (current goals can now be found in the care plan section) Acute Rehab PT Goals Patient Stated Goal: none stated PT Goal Formulation: Patient unable to participate in goal setting Time For Goal Achievement: 08/15/13 Potential to Achieve Goals: Fair  Visit Information  Last PT Received On: 08/10/13 Assistance Needed: +2 History of Present Illness: Pt adm with AMS and resp failure. Work-up in progress.    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 Sitting Balance Static Sitting - Balance Support: Feet supported Static Sitting - Level of Assistance: 5: Stand by assistance Static Sitting - Comment/# of Minutes: sat EOB for 4 minutes static sitting Dynamic Sitting Balance Dynamic Sitting - Balance Support: Feet supported;During functional activity Dynamic Sitting - Level of Assistance: 4: Min assist Dynamic Sitting - Balance Activities: Lateral lean/weight shifting;Forward lean/weight shifting;Head control activities;Trunk control activities Dynamic Sitting - Comments: 6 minutes of trunk control  End of Session PT - End of Session Equipment Utilized During Treatment: Gait belt Activity Tolerance: Patient limited by fatigue Patient left: in chair;with call bell/phone within reach;with chair alarm set Nurse Communication: Mobility status   GP     Fabio Asa 08/10/2013, 1:29 PM Charlotte Crumb, PT DPT  [phone]

## 2013-08-10 NOTE — Progress Notes (Signed)
Subjective: Awake and alert. Taking breakfast with assistance. Seems comfortable with no active complaints.   Objective: Lab:  Recent Labs  08/08/13 0352 08/09/13 0416  WBC 7.3 7.0  HGB 11.0* 10.3*  HCT 32.5* 31.3*  MCV 98.5 98.7  PLT 216 213    Recent Labs  08/08/13 0352 08/09/13 0416  NA 139 136  K 3.8 3.6  CL 105 104  GLUCOSE 69* 127*  BUN 18 16  CREATININE 0.87 0.98  CALCIUM 8.4 8.4  MG 1.9  --   PHOS 2.2*  --     Imaging: CT Angio: 08/09/13 - /P 4 vessle cerebral arteriogram.  RT CFA approach.  Findings 1.(5 % stenosis RT ICA prox.  2.90% stenosis of both Vert artery origins  3.70%stenosis of RT ICA distal cervical segment.  Scheduled Meds: . atorvastatin  40 mg Oral Daily  . chlorhexidine  15 mL Mouth/Throat Q12H  . clopidogrel  75 mg Oral Daily  . famotidine  20 mg Oral Daily  . heparin  5,000 Units Subcutaneous Q8H  . insulin aspart  0-9 Units Subcutaneous TID WC  . piperacillin-tazobactam (ZOSYN)  IV  3.375 g Intravenous Q8H  . sodium chloride  3 mL Intravenous Q12H   Continuous Infusions: . dextrose 5 % and 0.45% NaCl 50 mL/hr at 08/09/13 2038  . propofol Stopped (08/06/13 0742)   PRN Meds:.acetaminophen, acetaminophen, albuterol, ipratropium, metoprolol, ondansetron (ZOFRAN) IV, ondansetron   Physical Exam: Filed Vitals:   08/10/13 0557  BP: 117/73  Pulse: 61  Temp: 97.1 F (36.2 C)  Resp: 16   Ill-kempt man in no distress. Dense expressive aphasia HEENT- - very poor dentition, C&S clear Cor  2+ radial, lots of ectopy Pulm - no increased work of breathing. No Rales or wheezing Abd- flat, BS+ Neuro - awake, alert, seems to form thoughts but has expressive aphasia making communication difficult. MAE to command. Strength not tested.     Assessment/Plan: 1. Neuro - New CVA with  Expressive aphasia, tongue deviation. Eval reveals significant obstructive disease both vertebrals and right ICA.  Plan Continue Plavix  PT/OT eval and  recommendations\  Vascular intervention - patient states he is willing to have intervention if it is reasonable. Await opinion of neurology and IR as to whether he is a candidate for  Angioplasty/stent for risk reduction.  2. Cardiac - stable cardiomyopathy. No evidence of decompensation  3. ID - #5/7 Zosyn for probable aspiration PNA  4. F/E/N - protien calorie malnutrition. He is taking a diet and not supplements have been recommended  5. DM  CBG (last 3)   Recent Labs  08/09/13 1631 08/09/13 2130 08/10/13 0643  GLUCAP 159* 112* 91    Continue basal insulin and sliding scale.  Dispo - he will most likely require SNF placement. Question about family in [place] who may assist in bringing him there for care and recovery.    Illene Regulus [facility] (o) [phone]; (c) [phone] Call-grp - [facility]  Tele: [phone]  08/10/2013, 7:51 AM  '

## 2013-08-10 NOTE — Progress Notes (Signed)
Speech Language Pathology Treatment: Dysphagia  Patient Details Name: [name] MRN: [id number] DOB: Sep 26, 1945 Today's Date: 08/10/2013 Time: 7829-5621 SLP Time Calculation (min): 8 min  Assessment / Plan / Recommendation Clinical Impression  Skilled dysphagia therapy with pt. alert, in bed and calmer.  Chest congestion not audible today.  Mastication and transit with solid texture was functional.  One delayed cough following sip thin versus baseline cough (?).  Recommend he continue regular diet and thin liquids with full supervision and assist due to impulsivity and cognitive deficits.  No follow up needed.   HPI HPI: 67 yo male adm to [facility] with AMS, severe dysarthria, has h/o right MCA CVA.  Pt with episode of emesis and required intubation 11/28-11/29.  Bedside swallow evaluation recommended an MBS.     Pertinent Vitals WDL  SLP Plan  Continue with current plan of care    Recommendations Diet recommendations: Regular;Thin liquid Liquids provided via: Cup;Straw Medication Administration: Whole meds with puree Supervision: Patient able to self feed;Full supervision/cueing for compensatory strategies Compensations: Slow rate;Small sips/bites Postural Changes and/or Swallow Maneuvers: Seated upright 90 degrees              Oral Care Recommendations: Oral care BID Follow up Recommendations: Skilled Nursing facility Plan: Continue with current plan of care    GO     Royce Macadamia M.Ed [employer] [phone]  08/10/2013

## 2013-08-10 NOTE — Progress Notes (Signed)
Talked to Attending MD about DCP; patient is to go to SNF in [place] at discharge and possibly ready for this week; Information given to Soc Worker Ellan Lambert RN, BSN, [place] [phone]

## 2013-08-10 NOTE — Clinical Social Work Placement (Signed)
Clinical Social Work Department CLINICAL SOCIAL WORK PLACEMENT NOTE 08/10/2013  Patient:  [name], [name]  Account Number:  [id number] Admit date:  08/05/2013  Clinical Social Worker:  Read Drivers  Date/time:  08/10/2013 03:31 PM  Clinical Social Work is seeking post-discharge placement for this patient at the following level of care:   SKILLED NURSING   (*CSW will update this form in Epic as items are completed)   08/10/2013  Patient/family provided with [facility] Department of Clinical Social Work's list of facilities offering this level of care within the geographic area requested by the patient (or if unable, by the patient's family).  08/10/2013  Patient/family informed of their freedom to choose among providers that offer the needed level of care, that participate in Medicare, Medicaid or managed care program needed by the patient, have an available bed and are willing to accept the patient.  08/10/2013  Patient/family informed of [facility]' ownership interest in [facility], as well as of the fact that they are under no obligation to receive care at this facility.  PASARR submitted to EDS on 08/10/2013 PASARR number received from EDS on 08/10/2013  FL2 transmitted to all facilities in geographic area requested by pt/family on  08/10/2013 FL2 transmitted to all facilities within larger geographic area on 08/10/2013  Patient informed that his/her managed care company has contracts with or will negotiate with  certain facilities, including the following:     Patient/family informed of bed offers received:   Patient chooses bed at  Physician recommends and patient chooses bed at    Patient to be transferred to  on   Patient to be transferred to facility by   The following physician request were entered in Epic:   Additional Comments: Vickii Penna, LCSWA [phone]  Clinical Social Work

## 2013-08-10 NOTE — Clinical Social Work Note (Addendum)
3:13pm - CSW spoke with pt at bedside to confirm pt intent to participate in STR at SNF.  Pt is agreeable to SNF placement.  Pt would like to have STR locally.  CSW will start referral process and present bed offers as they are present.  12:48pm - CSW spoke with son, Casimiro Needle re: disposition.  Son's preference is that pt remain in [place] for STR/SNF and if pt wishes to return to [place] upon dc from SNF, son would make possible arrangements.  Son is not objective to pt going to [place] for STR, but states that pt has f/u care set here and would like for pt to have such f/u care prior to coming to [place].     CSW to meet with pt to inquire what pt wishes are regarding STR.  Per RNCM, pt agreeable to SNF placement upon dc.  CSW will continue to follow and assist with dispositioning.  Vickii Penna, LCSWA [phone]  Clinical Social Work

## 2013-08-10 NOTE — Progress Notes (Addendum)
Subjective: Patient is doing better with still c/o right sided weakness. He would like to proceed with left vertebral artery intervention after a long discusion today with Dr. Corliss Skains.  Objective: Physical Exam: BP 127/82  Pulse 69  Temp(Src) 98 F (36.7 C) (Oral)  Resp 18  Ht 5\' 6"  (1.676 m)  Wt 161 lb 1.6 oz (73.074 kg)  BMI 26.01 kg/m2  SpO2 97%  General: A&ox3, NAD Neuro: Still with expressive aphasia, RUE and RLE weakness +4/5 compared to Left side.   Labs: CBC  Recent Labs  08/08/13 0352 08/09/13 0416  WBC 7.3 7.0  HGB 11.0* 10.3*  HCT 32.5* 31.3*  PLT 216 213   BMET  Recent Labs  08/08/13 0352 08/09/13 0416  NA 139 136  K 3.8 3.6  CL 105 104  CO2 22 23  GLUCOSE 69* 127*  BUN 18 16  CREATININE 0.87 0.98  CALCIUM 8.4 8.4   LFT No results found for this basename: PROT, ALBUMIN, AST, ALT, ALKPHOS, BILITOT, BILIDIR, IBILI, LIPASE,  in the last 72 hours PT/INR No results found for this basename: LABPROT, INR,  in the last 72 hours   Studies/Results: Ir Angio Intra Extracran Sel Com Carotid Innominate Bilat Mod Sed  08/10/2013   CLINICAL DATA:  History of previous right cerebral hemispheric strokes. Left-sided paresthesias. Speech dysarthria. Abnormal CT angiogram of the neck.  EXAM: BILATERAL COMMON CAROTID AND INNOMINATE ANGIOGRAPHY AND BILATERAL VERTEBRAL ARTERY ANGIOGRAMS:  MEDICATIONS: Versed 1 mg IV. Fentanyl 25 mcg IV.  ANESTHESIA/SEDATION: Conscious sedation.  CONTRAST:  OMNIPAQUE IOHEXOL 300 MG/ML  SOLN  PROCEDURE: Following a full explanation of the procedure along with the potential associated complications, an informed witnessed consent was obtained.  The right groin was prepped and draped in the usual sterile fashion. Thereafter, using a modified Seldinger technique, transfemoral access into the right common femoral artery was obtained without difficulty. Over a 0.035-inch guidewire, a 5 [place] Pinnacle sheath was inserted. Through this and  also over a 0.035-inch guidewire, a 5 [place] JB1 catheter was advanced to the aortic region and selectively positioned in the right common carotid artery, the right subclavian artery, the left common carotid artery and left subclavian artery.  There were no acute complications. The patient tolerated the procedure well.  COMPLICATIONS: None immediate.  FINDINGS: The right common carotid arteriogram demonstrates the right external carotid artery to be mildly narrowed at its origin. Its branches are normal, however.  The right internal carotid artery at the bulb has a 95% plus stenoses secondary to a smooth circumferential plaque. Distal to this the vessel resumes normal caliber to the level of C2 were there is approximately 70% stenoses associated with post stenotic dilatation.  Distal to this the petrous, the cavernous and the supraclinoid segments are widely patent. The right middle and the right anterior cerebral arteries opacify normally into the capillary and venous phases, except for a modest sized area of hypoperfusion involving the posterior aspect of the parietal cortical subcortical area.  The right subclavian arteriogram demonstrates a 90% stenosis of the origin at the right vertebral artery.  The vessel, otherwise, opacifies to the cranial skull base to opacify the right vertebrobasilar junction and the right posterior inferior cerebellar artery.  The opacified portions of the basilar artery, the posterior cerebral arteries and superior cerebellar arteries are grossly normal into the delayed arterial phase.  The left common carotid arteriogram demonstrates the left external carotid artery and its major branches to be normal.  The left internal carotid  artery just distal to the bulb has a smooth plaque without significant stenoses by the NASCET criteria.  More distally the vessel opacifies to the cranial skull base.  At the level of C1 there is a small focal protrusion probably representing an approximately  4 mm aneurysm.  More distally the petrous, the cavernous and the supraclinoid segments are widely patent.  The left middle and the left anterior cerebral artery opacify into the capillary and venous phases.  The left subclavian arteriogram demonstrates the left vertebral artery to be narrowed by approximately 90-95%. More distally the vessel is seen to opacity to the cranial skull base.  There is suggestion of mild FMD-like changes at the level of the posterior arch of C1.  Distal to this the basilar artery, the posterior cerebral arteries, superior cerebellar arteries and the anterior inferior cerebellar arteries opacify normally into the capillary and venous phases. Non-opacified blood is seen in the basilar artery from the contralateral vertebral artery.  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 Approximately 90-95% stenosis of the left vertebral artery at its origin.  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   Electronically Signed   By: Julieanne Cotton M.D.   On: 08/09/2013 10:29    Assessment/Plan: History of previous right MCA CVA 2002 Right sided weakness and dysarthria.  S/p cerebral arteriogram findings include approximately 90% stenosis of the right vertebral artery at its origin.  Approximately 90-95% stenosis of the left vertebral artery at its origin. Approximately 95% stenosis the right internal carotid artery at the bulb.  Scheduled tent for cerebral arteriogram with left vertebral artery angioplasty and stenting on 08/17/13 Dr. Corliss Skains has spoke with the patient and the patient's son who are all in agreement. Risks and Benefits discussed with the patient and the patient's son. All of the patient's questions were answered, patient is agreeable to proceed. Consent signed and in IR. Continue Aspirin and Plavix  daily.      LOS: 5 days    Cloretta Ned 08/10/2013 12:29 PM

## 2013-08-11 ENCOUNTER — Inpatient Hospital Stay (HOSPITAL_COMMUNITY): Payer: Medicare Other

## 2013-08-11 LAB — GLUCOSE, CAPILLARY
Glucose-Capillary: 165 mg/dL — ABNORMAL HIGH (ref 70–99)
Glucose-Capillary: 78 mg/dL (ref 70–99)

## 2013-08-11 LAB — CULTURE, BLOOD (ROUTINE X 2): Culture: NO GROWTH

## 2013-08-11 NOTE — Progress Notes (Signed)
Stroke Team Progress Note  HISTORY [name] is a 67 y.o. male with a history of a right MCA stroke who presents with altered mental status. He was last seen normal last night prior to going to bed. He arrived in the ER around 10 AM. He has a history of bipolar disorder as well as multiple sclerosis and takes Depakote for the bipolar disorder.    In the ER he is unable to answer questions and chest x-ray revealed pneumonia versus atelectasis. CT of his brain was negative. In the emergency room he vomited and aspirated. He was intubated secondary to this event.   LKW: 11/27 prior to going to bed  tpa given?: no, outside of window   08/09/2013: Dr. Corliss Skains S/P 4 vessle cerebral arteriogram.  RT CFA approach.  Findings 1.(5 % stenosis RT ICA prox.  2.90% stenosis of both Vert artery origins  3.70%stenosis of RT ICA distal cervical segment.  Over past several days he has had improved mental status, but has severe dysarthria, left tongue deviation and mild right hemiparesis. Transferred to stroke service.   SUBJECTIVE Patient sitting in chair, more alert and communicative today.  Expressive aphasia remain as well as leg weakness and sensory loss. Hopeful to proceed with vascular intervention in AM.  OBJECTIVE Most recent Vital Signs: Filed Vitals:   08/10/13 1953 08/11/13 0210 08/11/13 0612 08/11/13 0700  BP: 131/80 111/63  127/71  Pulse: 75 60  68  Temp: 98.2 F (36.8 C) 98 F (36.7 C)  98.1 F (36.7 C)  TempSrc: Oral Oral  Oral  Resp: 18 20  18   Height:      Weight:   54.704 kg (120 lb 9.6 oz)   SpO2: 95% 96%  97%   CBG (last 3)   Recent Labs  08/10/13 1613 08/10/13 2135 08/11/13 0648  GLUCAP 95 137* 86    IV Fluid Intake:   . dextrose 5 % and 0.45% NaCl 50 mL/hr at 08/11/13 0608  . propofol Stopped (08/06/13 4098)    MEDICATIONS  . aspirin EC  81 mg Oral Daily  . atorvastatin  40 mg Oral Daily  . chlorhexidine  15 mL Mouth/Throat Q12H  . clopidogrel  75 mg  Oral Daily  . famotidine  20 mg Oral Daily  . heparin  5,000 Units Subcutaneous Q8H  . insulin aspart  0-9 Units Subcutaneous TID WC  . piperacillin-tazobactam (ZOSYN)  IV  3.375 g Intravenous Q8H  . sodium chloride  3 mL Intravenous Q12H   PRN:  acetaminophen, acetaminophen, albuterol, ipratropium, metoprolol, ondansetron (ZOFRAN) IV, ondansetron  Diet:  Dysphagia 3 with thin liquids Activity:   Up with assistance DVT prophy: Heparin SQ  CLINICALLY SIGNIFICANT STUDIES Basic Metabolic Panel:   Recent Labs Lab 08/07/13 0419 08/08/13 0352 08/09/13 0416  NA 138 139 136  K 3.6 3.8 3.6  CL 104 105 104  CO2 22 22 23   GLUCOSE 73 69* 127*  BUN 19 18 16   CREATININE 0.87 0.87 0.98  CALCIUM 8.3* 8.4 8.4  MG 2.0 1.9  --   PHOS 2.7 2.2*  --    Liver Function Tests:   Recent Labs Lab 08/05/13 1053 08/05/13 1645  AST 19 24  ALT 9 9  ALKPHOS 84 84  BILITOT 0.4 0.8  PROT 7.4 7.3  ALBUMIN 2.7* 2.8*   CBC:  Recent Labs Lab 08/05/13 1053  08/06/13 0310  08/08/13 0352 08/09/13 0416  WBC 13.8*  --  12.6*  < > 7.3 7.0  NEUTROABS 12.4*  --  10.2*  --   --   --   HGB 12.6*  < > 12.1*  < > 11.0* 10.3*  HCT 37.2*  < > 36.5*  < > 32.5* 31.3*  MCV 97.9  --  99.7  < > 98.5 98.7  PLT 243  --  215  < > 216 213  < > = values in this interval not displayed. Coagulation:   Recent Labs Lab 08/05/13 1053  LABPROT 14.0  INR 1.10   Cardiac Enzymes:   Recent Labs Lab 08/05/13 1349 08/05/13 1627 08/05/13 2235 08/06/13 0310  CKTOTAL 92  --   --   --   TROPONINI  --  <0.30 <0.30 <0.30   Urinalysis:   Recent Labs Lab 08/05/13 1124  COLORURINE YELLOW  LABSPEC 1.022  PHURINE 6.5  GLUCOSEU NEGATIVE  HGBUR NEGATIVE  BILIRUBINUR NEGATIVE  KETONESUR NEGATIVE  PROTEINUR 30*  UROBILINOGEN 1.0  NITRITE NEGATIVE  LEUKOCYTESUR NEGATIVE   Lipid Panel    Component Value Date/Time   CHOL 155 08/08/2013 0352   TRIG 86 08/08/2013 0352   HDL 64 08/08/2013 0352   CHOLHDL 2.4  08/08/2013 0352   VLDL 17 08/08/2013 0352   LDLCALC 74 08/08/2013 0352   HgbA1C  Lab Results  Component Value Date   HGBA1C 5.6 08/07/2013    Urine Drug Screen:     Component Value Date/Time   LABOPIA NONE DETECTED 08/05/2013 1124   COCAINSCRNUR NONE DETECTED 08/05/2013 1124   LABBENZ NONE DETECTED 08/05/2013 1124   AMPHETMU NONE DETECTED 08/05/2013 1124   THCU NONE DETECTED 08/05/2013 1124   LABBARB NONE DETECTED 08/05/2013 1124    Alcohol Level:   Recent Labs Lab 08/05/13 1053  ETH <11    Ct Head Wo Contrast 08/07/2013   1. 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 Head CT is unchanged from 08/05/2013. 2. Diffuse cerebral atrophy and chronic white matter ischemic change. 3. Soft tissue densities in the external auditory canals bilaterally. This is most likely cerumen, however direct visualization is suggested.     Dg Swallowing Func-speech Pathology 08/08/2013   [name], CCC-SLP     08/08/2013  1:49 PM Objective Swallowing Evaluation: Modified Barium Swallowing Study   Patient Details  Name: [name] MRN: [id number] Date of Birth: 1946-04-14  Today's Date: 08/08/2013 Time: 1130-1150 SLP Time Calculation (min): 20 min  Past Medical History:  Past Medical History  Diagnosis Date  . Coronary artery disease     s/p PCI of LAD with BMS 04/03/06 at [facility]  . Hyperlipidemia   . Hypertension   . Cerebrovascular accident     history of cerebrovascular accident with residual left  hemiparesis in 2002, seen at [facility]  . Multiple sclerosis   . Ischemic cardiomyopathy     s/p BiV ICD implanted 2007 at [facility]  . Chronic systolic congestive heart failure   . Bipolar disorder    Past Surgical History:  Past Surgical History  Procedure Laterality Date  . Tonsillectomy    . Vasectomy    . Implant of automatic cardioverter defibrillator      December 26, 2004 MDT BiV ICD implanted by Dr Chales Abrahams in [facility]  . Ptca      PCI LAD with BMS  04/03/06   HPI:  67 yo male adm to [facility] with AMS, severe dysarthria, has h/o right  MCA CVA.  Pt with episode of emesis and required intubation  11/28-11/29.  Bedside swallow evaluation recommended an MBS.       Assessment / Plan / Recommendation Clinical Impression  Dysphagia Diagnosis: Mild pharyngeal phase dysphagia Clinical impression: Pt. exhibited min-mild sensory based  pharyngeal dysphagia.  Initiation of swallow was delayed to pt.'s  pyriform sinuses with liquid consistencies caused by decreased  sensation.  Protection of laryngeal vestibule and trachea was  adequate without penetration or aspiration observed.  Pharyngeal  residue was min-mild and intermittent.  Aspiration risk appears  mild per clinical observations during study, however pt.'s  awareness and cognitive status appears decreased.  SLP recommends  a regular texture diet and thin liquids, straw ok, sit upright,  small sips and pills whole in applesauce. ST will continue to see  for safety with recommendations.     Treatment Recommendation  Therapy as outlined in treatment plan below    Diet Recommendation Thin liquid;Regular   Liquid Administration via: Cup;Straw Medication Administration: Whole meds with puree Supervision: Patient able to self feed;Full supervision/cueing  for compensatory strategies Compensations: Slow rate;Small sips/bites Postural Changes and/or Swallow Maneuvers: Seated upright 90  degrees    Other  Recommendations Oral Care Recommendations: Oral care BID   Follow Up Recommendations  None    Frequency and Duration min 2x/week  2 weeks   Pertinent Vitals/Pain No pain            Reason for Referral Objectively evaluate swallowing function   Oral Phase Oral Preparation/Oral Phase Oral Phase: WFL   Pharyngeal Phase Pharyngeal Phase Pharyngeal Phase: Impaired Pharyngeal - Honey Pharyngeal - Honey Teaspoon: Delayed swallow initiation;Premature  spillage to pyriform sinuses Pharyngeal - Honey Cup: Delayed swallow  initiation;Premature  spillage to pyriform sinuses;Pharyngeal residue -  valleculae;Reduced tongue base retraction Pharyngeal - Nectar Pharyngeal - Nectar Teaspoon: Delayed swallow  initiation;Premature spillage to pyriform sinuses;Pharyngeal  residue - valleculae;Reduced tongue base retraction Pharyngeal - Nectar Cup: Delayed swallow initiation;Premature  spillage to pyriform sinuses;Pharyngeal residue -  valleculae;Reduced tongue base retraction Pharyngeal - Thin Pharyngeal - Thin Teaspoon: Premature spillage to pyriform  sinuses;Delayed swallow initiation;Pharyngeal residue -  valleculae;Reduced tongue base retraction Pharyngeal - Thin Cup: Delayed swallow initiation;Premature  spillage to pyriform sinuses;Pharyngeal residue -  valleculae;Reduced tongue base retraction Pharyngeal - Thin Straw: Delayed swallow initiation;Premature  spillage to pyriform sinuses;Pharyngeal residue -  valleculae;Reduced tongue base retraction Pharyngeal - Solids Pharyngeal - Puree: Within functional limits Pharyngeal - Regular: Within functional limits  Cervical Esophageal Phase    GO    Cervical Esophageal Phase Cervical Esophageal Phase: [facility]         Darrow Bussing.Ed CCC-SLP Pager [phone]  08/08/2013    EEG  This EEG is consistent with a right occipital lobe dysfunction likely secondary to patient's previous stroke. There was no seizure or seizure predisposition recorded on this study  08/09/2013: Dr. Corliss Skains S/P 4 vessle cerebral arteriogram.  RT CFA approach.  Findings 1.(5 % stenosis RT ICA prox.  2.90% stenosis of both Vert artery origins  3.70%stenosis of RT ICA distal cervical segment  2D Echocardiogram  EF 20% with diffuse HK, Left atrium mod-severely dilated. No cardiac source of embolism identified.                                    (previous echo, 10/2011 showed EF 25%)  Carotid Doppler see angio   CXR  Patchy densities in R lung base suggestive for PNA/aspiration  EKG  normal sinus rhythm.    Therapy Recommendations SNF  Physical Exam   General: In bed, intubated, slightly cachectic  CV: Regular rate and rhythm  Mental Status:  Patient is awake and interactive today. Has moderate dysarthria but can be understood with some difficulty. Able to answer names and follows simple commands. Not able to give me his son's telephone number. Cranial Nerves:  II: Does not blink to threat. Pupils are equal, round, and reactive to light. Discs are difficult to visualize.  III,IV, VI: No clear reaction to doll's eye, but eyes are conjugate.  V/VII: Corneals are intact  VIII, X, XI, XII: Unable to assess secondary to patient's altered mental status.  Motor:  Tone is normal. He does withdraw to noxious stimuli bilaterally and arms and legs mild weakness of the right leg. Sensory:  As above  Deep Tendon Reflexes:  2+ and symmetric in the biceps and patellae.  Cerebellar:  Unable to assess secondary to patient's altered mental status.  Gait:  Unable to assess secondary to patient's altered mental status.    ASSESSMENT [name] is a 67 y.o. male presenting with dysarthria and mild right leg weakness likely due to a small left brain stem infarct not visualized on CT scan and MRI cannot be done because of pacemaker Imaging confirms an old right temporoparietal infarct. 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  On no antithrombotics prior to admission. Now on clopidogrel 75 mg orally every day for secondary stroke prevention. Plans for intervention as below.   Hyperlipidemina, LDL goal < 70, at goal, continue statin  Cardiomyopathy, has ICD, therefore no MRI   Hospital day # 6  TREATMENT/PLAN  Continue clopidogrel 75 mg orally every day for secondary stroke prevention.  Continue Atorvastatin 40 daily for  secondary stroke prevention  SNF recommended  Patient's son desires treatment of patient's vascular disease. We have discussed with Dr. Corliss Skains patient will have possible IR procedure tomorrow, 08/12/2013 and then to SNF 1-2 days post. If unable to have vascular stenting on 08/12/2013, plans would be to go ahead and discharge to SNF and bring patient back in from outpatient setting in 7-10 days.  Telephonic discussion with patient's son in [place] about his stroke, results of cerebral catheter angiogram and treatment options including medical therapy alone versus angioplasty stenting. He prefers left vertebral artery origin angioplasty and stenting at the present time. Patient is likely going to go to skilled nursing facility for rehabilitation. ( He may move to the [place] area to be close to his son and further neurovascular revascularization may have to be done there).  Dr. Pearlean Brownie discussed timing and plan of intervention with primary team, Dr. Debby Bud.  Gwendolyn Lima. Manson Passey, [facility], MBA, MHA [facility] Pager: [phone] 08/11/2013 12:45 PM  I have personally obtained a history, examined the patient, evaluated imaging results, and formulated the assessment and plan of care. I agree with the above. Delia Heady, MD

## 2013-08-11 NOTE — Progress Notes (Signed)
Subjective: Mr. Beavers is awake, dysarthric but communicates no discomfort except for the IV site right hand  Objective: Lab:  Recent Labs  08/09/13 0416  WBC 7.0  HGB 10.3*  HCT 31.3*  MCV 98.7  PLT 213    Recent Labs  08/09/13 0416  NA 136  K 3.6  CL 104  GLUCOSE 127*  BUN 16  CREATININE 0.98  CALCIUM 8.4    Imaging:  Scheduled Meds: . aspirin EC  81 mg Oral Daily  . atorvastatin  40 mg Oral Daily  . chlorhexidine  15 mL Mouth/Throat Q12H  . clopidogrel  75 mg Oral Daily  . famotidine  20 mg Oral Daily  . heparin  5,000 Units Subcutaneous Q8H  . insulin aspart  0-9 Units Subcutaneous TID WC  . piperacillin-tazobactam (ZOSYN)  IV  3.375 g Intravenous Q8H  . sodium chloride  3 mL Intravenous Q12H   Continuous Infusions: . dextrose 5 % and 0.45% NaCl 50 mL/hr at 08/11/13 0608  . propofol Stopped (08/06/13 0742)   PRN Meds:.acetaminophen, acetaminophen, albuterol, ipratropium, metoprolol, ondansetron (ZOFRAN) IV, ondansetron   Physical Exam: Filed Vitals:   08/11/13 0700  BP: 127/71  Pulse: 68  Temp: 98.1 F (36.7 C)  Resp: 18   gen'l - disheveled man in no distress Cor- RRR Pulm - normal respirations Neuro - dysarthric and unchanged     Assessment/Plan: 1. Neuro - Question of whether he is a candidate for l vertebral angioplasty? And if so the timing. Dr. Pearlean Brownie will speak with Dr. Corliss Skains about this.  Plan continue plavix  2. Cardiac - stable  3. ID #6/7 [email or web address] for PNA Plan F/u CXR  4. Protein-calorie malnutrition. Plan continue supplements  5. DM    CBG (last 3)   Recent Labs  08/10/13 1613 08/10/13 2135 08/11/13 0648  GLUCAP 95 137* 86      Dispo - if no angioplasty or if delayed angioplasty will plan to d/c to snf tomorrow.   Illene Regulus [place] [facility] (o) [phone]; (c) [phone] Call-grp - [facility]  Tele: [phone]  08/11/2013, 8:33 AM

## 2013-08-11 NOTE — Clinical Social Work Note (Signed)
CSW met with pt and pt's friend/caregiver at bedside. CSW presented SNF placement bed offers to pt. Pt and pt's friend/caregiver stated that pt would like to be placed at [facility] in [place]. CSW contacted admissions coordinator with [facility]. [facility] aware of pt's possible discharge to SNF on 08/12/2013. [facility] admission coordinator stated that pt may discharge to [facility] over the weekend, but would need discharge paperwork sent into the facility on 08/12/2013.  CSW to continue to follow and update [facility] as more information is received regarding pt's discharge.  Darlyn Chamber, LCSWA Clinical Social Worker [phone]

## 2013-08-11 NOTE — Progress Notes (Signed)
I have spoke with Dr. Corliss Skains about [name]'s IR procedure tentatively scheduled for 08/12/13 and Dr. Corliss Skains feels given his CXR today with progressive aspiration pneumonia, he prefers to have the procedure once his pneumonia has improved. The patient is to remain on aspirin and plavix daily. We will coordinate and once the patient is scheduled for his IR procedure, we will obtain a CXR, cbc with diff and p2y12 the day prior per Dr. Corliss Skains.  Pattricia Boss PA-C Interventional Radiology  08/11/13  3:06 PM

## 2013-08-12 ENCOUNTER — Encounter (HOSPITAL_COMMUNITY)
Admission: EM | Disposition: A | Discharge status: Skilled Nursing Facility | Payer: Self-pay | Source: Home / Self Care | Attending: Internal Medicine

## 2013-08-12 DIAGNOSIS — I771 Stricture of artery: Secondary | ICD-10-CM

## 2013-08-12 DIAGNOSIS — I6509 Occlusion and stenosis of unspecified vertebral artery: Secondary | ICD-10-CM

## 2013-08-12 DIAGNOSIS — E785 Hyperlipidemia, unspecified: Secondary | ICD-10-CM

## 2013-08-12 DIAGNOSIS — F17200 Nicotine dependence, unspecified, uncomplicated: Secondary | ICD-10-CM

## 2013-08-12 DIAGNOSIS — I428 Other cardiomyopathies: Secondary | ICD-10-CM

## 2013-08-12 LAB — GLUCOSE, CAPILLARY
Glucose-Capillary: 80 mg/dL (ref 70–99)
Glucose-Capillary: 93 mg/dL (ref 70–99)

## 2013-08-12 SURGERY — RADIOLOGY WITH ANESTHESIA
Anesthesia: General

## 2013-08-12 MED ORDER — CARVEDILOL 25 MG PO TABS: 25.0000 mg | ORAL_TABLET | Freq: Two times a day (BID) | ORAL | Status: DC

## 2013-08-12 MED ORDER — BENAZEPRIL HCL 40 MG PO TABS: 40.0000 mg | ORAL_TABLET | Freq: Every day | ORAL | Status: DC

## 2013-08-12 MED ORDER — FUROSEMIDE 20 MG PO TABS: 20.0000 mg | ORAL_TABLET | Freq: Every day | ORAL | Status: DC

## 2013-08-12 MED ORDER — ONDANSETRON HCL 4 MG PO TABS: 4.0000 mg | ORAL_TABLET | Freq: Four times a day (QID) | ORAL | Status: DC | PRN

## 2013-08-12 MED ORDER — ASPIRIN 81 MG PO TBEC: 81.0000 mg | DELAYED_RELEASE_TABLET | Freq: Every day | ORAL | Status: DC

## 2013-08-12 MED ORDER — VALPROIC ACID 250 MG PO CAPS: 250.0000 mg | ORAL_CAPSULE | Freq: Two times a day (BID) | ORAL | Status: DC

## 2013-08-12 MED ORDER — FAMOTIDINE 20 MG PO TABS: 20.0000 mg | ORAL_TABLET | Freq: Every day | ORAL | Status: DC

## 2013-08-12 MED ORDER — ATORVASTATIN CALCIUM 40 MG PO TABS: 40.0000 mg | ORAL_TABLET | Freq: Every day | ORAL | Status: DC

## 2013-08-12 MED ORDER — FLUOXETINE HCL 20 MG PO CAPS: 20.0000 mg | ORAL_CAPSULE | Freq: Every day | ORAL | Status: DC

## 2013-08-12 MED ORDER — CLOPIDOGREL BISULFATE 75 MG PO TABS: 75.0000 mg | ORAL_TABLET | Freq: Every day | ORAL | Status: DC

## 2013-08-12 MED ORDER — SPIRONOLACTONE 25 MG PO TABS: 25.0000 mg | ORAL_TABLET | Freq: Every day | ORAL | Status: DC

## 2013-08-12 MED ORDER — ACETAMINOPHEN 325 MG PO TABS: 650.0000 mg | ORAL_TABLET | Freq: Four times a day (QID) | ORAL | Status: DC | PRN

## 2013-08-12 NOTE — Discharge Summary (Signed)
NAMEPERL, KERNEY NO.:  [id number]  MEDICAL RECORD NO.:  [id number]  LOCATION:  [id number]                        FACILITY:  [facility]  PHYSICIAN:  Rosalyn Gess. Norins, MD  DATE OF BIRTH:  1945-12-04  DATE OF ADMISSION:  08/05/2013 DATE OF DISCHARGE:                              DISCHARGE SUMMARY   ADDENDUM:  Reviewed pharmacy communication in regard to patient's pre- hospital medications.  At this time, we will add additional medications to this list. 1. Benazepril 40 mg daily. 2. Coreg 25 mg b.i.d. 3. Prozac 20 mg daily. 4. Lasix 20 mg daily. 5. Aldactone 25 mg daily. 6. Depakene 250 mg b.i.d.  The patient would need to be observed carefully in regard to blood pressure as these medications are being resumed.  He has been normotensive during his hospital stay.     Rosalyn Gess Norins, MD     MEN/MEDQ  D:  08/12/2013  T:  08/12/2013  Job:  [id number]

## 2013-08-12 NOTE — Progress Notes (Signed)
Report given to [place], RN from [facility].

## 2013-08-12 NOTE — Discharge Summary (Signed)
[name], [name] NO.:  [id number]  MEDICAL RECORD NO.:  [id number]  LOCATION:  [id number]                        FACILITY:  [facility]  PHYSICIAN:  Rosalyn Gess. Norins, MD  DATE OF BIRTH:  18-Jan-1946  DATE OF ADMISSION:  08/05/2013 DATE OF DISCHARGE:  08/12/2013                              DISCHARGE SUMMARY   ADMITTING DIAGNOSES: 1. Acute encephalopathy with change in level of consciousness. 2. Hyperlipidemia. 3. Bipolar affective disorder. 4. Multiple sclerosis. 5. Hypertension. 6. Coronary artery disease. 7. Chronic systolic dysfunction with a EF 20%. 8. History of strokes. 9. Biventricular ICD implanted.  DISCHARGE DIAGNOSES: 1. Acute stroke with dysarthria. 2. Hyperlipidemia. 3. Bipolar affective disorder. 4. Multiple sclerosis. 5. Hypertension. 6. Coronary artery disease. 7. Chronic systolic dysfunction with a EF 20%.  CONSULTANTS:  Dr. Pearlean Brownie for stroke team, Dr. Corliss Skains for Interventional Radiology.  PROCEDURES: 1. Imaging, August 05, 2013:  CT of the head without contrast, with     normal ventricular morphology, no midline shift or mass effect,     small vessel chronic ischemic changes.  Old right hemispheric     infarct noted.  No intracranial hemorrhage, mass lesion or evidence     of acute infarction. 2. Chest x-ray, 1 view, November, 28, 2014: With right lower lobe atelectatic change versus mild infiltrate. Cardiac pacer is noted.  No CHF. 3. Portable chest x-ray, November 28:  Satisfactory positioning of the     endotracheal tube was noted.  Hazy opacities in right mid lung and     right lung base, which were suspicious for airspace disease.  Skin     folds project over the chest. 4. August 05, 2013, Portal abdomen, which showed nonobstructive     bowel gas pattern.  No pneumoperitoneum. 5. CT angio, head and neck, which showed chronic bilateral carotid     bifurcation atherosclerosis, on the right plaque is complex with     bulky  soft plaque component resulting in chronic high-grade right     ICA stenosis.  Radiographic string sign is noted.  Stable chronic     bilateral cervical ICA pseudoaneurysms, mostly partially     thrombosis.  No subsequent additional significant cervical ICA     stenosis.  Chronic plaque at both vertebral artery origins appear     stable and is likely hemodynamically significant bilaterally.  Both     vertebral arteries remained patent throughout the neck.  Abnormal     tree-in-bud right upper lobe pulmonary opacity compatible with     acute distal airways infection superimposed on emphysema.  Stable     CT appearance of the brain with chronic right MCA infarct as an     advanced white matter changes noted.  Stable and largely     unremarkable intracranial CTA; calcified atherosclerotic plaque     which is not hemodynamically significant.  There is no major Circle     of Willis branch occlusion. 6. Chest x-ray, August 06, 2013:  Patchy densities in the right lung     base suggestive for pneumonia or aspiration.  There has been     interval placement of a nasogastric tube. 7. Repeat  CT of the head, August 07, 2013, which showed old large     right temporal parietal infarct with encephalomalacia.  Diffuse     cerebral atrophy and chronic white matter ischemic change.  Soft     tissue densities in the extreme auditory canals representing     cerumen impaction. 8. Swallowing study, December 1, demonstrates a mild pharyngeal phase     dysphagia.  There was adequate protection of the laryngeal     vestibule and trachea without penetration or aspiration observed.     Aspiration risk appeared to be mild.  It is recommended the patient     have a regular diet with thin liquids. 9. Chest x-ray, August 11, 2013:  Progressive increased density at     the right lung base consistent with clinically suspected aspiration     pneumonia. 10. Echocardiogram performed on August 07, 2013, conclusions  with     left ventricle cavity size severely dilated.  Wall thickness was     normal.  Estimated EF 20% with diffuse hypokinesis.  Aortic valve     with sclerosis without stenosis.  Left atrium was moderately-to-     severely dilated.  Right ventricle cavity size was mildly dilated.     Pacer wire and catheter noted in the ventricle.  HISTORY OF PRESENT ILLNESS:  [name] is a 67 year old gentleman with multiple medical problems including significant ischemic cardiomyopathy, peripheral vascular disease, multiple sclerosis, chronic systolic heart failure, and bipolar disorder.  He was brought to the emergency department when his roommate found him unconscious and unable to be aroused, and that the patient was last seen the night prior to the morning of his admission.  In the emergency department, the patient could not answer questions or follow commands.  He does have a noted history of previous stroke with left-sided hemiparesis.  Evaluation in the emergency department was significant for right-sided lower lobe pneumonia with a leukocytosis of 13,000.  Because of his obtunded state, he was intubated to protect his airway and admitted to the Critical Care Service.  Please see the H and P for past medical history, family history, social history, and admission examination.  Also see the admitting Critical Care Service notes.  HOSPITAL COURSE: 1. Neuro:  The patient with a probable new stroke leaving him in     expressive dysarthria.  He was seen and followed by the stroke     team.  The patient was started on Plavix and aspirin.  The patient     was seen by Speech Pathology and has barium swallow with results as     above.  He was cleared to be able take an oral diet, regular     consistency with thin liquids.  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  The      patient was considered to be a candidate for an intervention     specifically of the left vertebral artery.  This was discussed by     Dr. Pearlean Brownie and Dr. Corliss Skains.  The patient and his son are in favor     of moving forward for procedure.  At the time of this dictation,     the patient is not yet stable for procedure. Once his pneumonia     is cleared and he is medically stable he will return  for     Interventional Radiology. The patient remains dysarthric.  He is able to move all his extremities and work with physical therapy.  He has been able take a diet successfully.  At this point, he is neurologically stable for transfer to skilled care. 2. Cardiac:  The patient with an existing history of ischemic     cardiomyopathy, and known coronary artery disease.  He does have a     biventricular ICD pacemaker in place.  He does have a EF of 20%.     During this hospital stay, he has been stable from a cardiac     perspective. 3. ID:  The patient has been treated with 7 days of IV Zosyn for     probable aspiration pneumonia.  His white count returned to a     normal level.  He has had no fever.  The patient has had no     shortness of breath.  He does not have an active cough.  At this     time, radiographic findings lag behind clinical improvement.  The     patient will not need additional antibiotics after completing his     full course of Zosyn. 4. Fluids, electrolytes and nutrition:  The patient with severe     protein-calorie malnutrition.  He is cachectic.  He was seen by RD     and he is now taking a regular diet.  At this time, he is taking     adequate calories and does not need to have supplements. 5. Hyperglycemia.  The patient was having episodes of hyperglycemia     during this admission.  He did have a hemoglobin A1c checked on     August 07, 2013, and it was 5.6%.  The patient is not an overt     diabetic.  He is not receiving any regular medication but had been     on  sliding scale.  At discharge, disposition would be to have a     regular diet.  He should have CBG monitoring 3 times a week.  If he     starts to trend up in regard to elevated capillary blood glucose, would     reinstitute sliding scale.  PHYSICAL EXAMINATION:  At time of this dictation, VITAL SIGNS:  Temperature was 98, blood pressure 117/73, heart rate 73, respirations 20, oxygen saturations 93% on room air. GENERAL APPEARANCE:  A chronically ill, cachectic-appearing Caucasian gentleman who is in no acute distress, and is able to communicate although dysarthric. HEENT:  Temporal wasting is noted.  The patient is very poor dentition with many bad teeth.  His eyes seem somewhat sunken.  Conjunctivae, sclerae are clear. NECK:  Thin.  No palpable thyromegaly is noted.  He had no JVD. PULMONARY:  The patient has no increased work of breathing.  On examination, he has no wheezes or rales but did has decreased breath sounds at the right base. CARDIOVASCULAR:  2+ radial pulse.  No JVD.  I did not appreciate any carotid bruits.  His precordium is quiet.  Heart sounds were regular. ABDOMEN:  Scaphoid.  He has positive bowel sounds.  No hepatosplenomegaly is noted.  No guarding or tenderness is noted. GENITALIA:  Normal male. RECTAL:  Deferred. EXTREMITIES:  Patient is extremely thin but he has no obvious bony deformities, no inflammation or swelling above the joints. NEURO:  The patient is awake.  He is alert.  He recognizes the examiner. He is able  to communicate although he is dysarthric.  Cranial nerves II through XII: patient has normal facial symmetry and movement. Extraocular muscles are intact.  He has no deviation of tongue or uvula. Speech is garbled because of his dysarthria.  Motor strength is 4+/5, able to move all his extremities, but he is in a generally weakened state does have more weakness on the left than the right.  He was able to move his extremities. MSK:  No joint  deformities are noted. DERM:  Patient has no skin lesions, although the skin turgor is very poor.  FINAL LABORATORY:  The patient had an arterial blood gas while on the ventilator, August 06, 2013, which showed a pH of 7.4, pCO2 of 37, pO2 of 96.  Final chemistries from August 09, 2013, with a sodium 136, potassium of 3.6, chloride 104, CO2 of 23, BUN 16, creatinine 0.98, glucose was 127.  From August 08, 2013; phosphorus was 2.2, magnesium was 1.9.  Liver functions from August 05, 2013 with an alkaline phosphatase of 84, albumin 2.8, amylase 55, lipase 12, AST 24, ALT 9 total protein 7.3, direct bilirubin 0.1, indirect bilirubin 0.7, total bilirubin 0.8. Troponins were less than 0.3, x2.  BNP from August 06, 2013, was 11,186. Lipid profile, August 08, 2013, with a cholesterol of 155, triglycerides of 86, HDL was 64, LDL was 74.  DISCHARGE MEDICATIONS:  Tylenol 650 mg q.6 hours p.r.n., aspirin 81 mg daily, clopidogrel 75 mg daily, Pepcid 20 mg daily, Zofran 4 mg q.6 h. P.r.n.-see attached addendums for full med list.  DISPOSITION:  The patient to be transferred to a skilled nursing facility for rehabilitation including PT, OT.  He will need attention to his nutritional status.  He should have CBGs 3 times a week to due to his history of hyperglycemia.  The patient is to return for Interventional Radiology procedure when deemed medically stable  recommend the nursing facility contact Dr. Corliss Skains, Interventional Radiology, in 5 days to set up an outpatient or inpatient procedure as indicated.  Long-term plans for the patient to return to [place] state where he has family, and these arrangements will need to be made by the patient and his son and cooperation with facility social work service.  Patient's code status by his wish, he is a DNR/DNI, but is willing to have significant medical intervention for intervening illness.  The patient's condition at time of discharge  dictation is medically stable but guarded given his depleted state and chronic medical problems.     Rosalyn Gess Norins, MD     MEN/MEDQ  D:  08/12/2013  T:  08/12/2013  Job:  [id number]  cc:   Dr. Wynonia Musty P. Pearlean Brownie, MD

## 2013-08-12 NOTE — Progress Notes (Signed)
Stroke Team Progress Note  HISTORY VIAAN Reese is a 67 y.o. male with a history of a right MCA stroke who presents with altered mental status. He was last seen normal last night prior to going to bed. He arrived in the ER around 10 AM. He has a history of bipolar disorder as well as multiple sclerosis and takes Depakote for the bipolar disorder.    In the ER he is unable to answer questions and chest x-ray revealed pneumonia versus atelectasis. CT of his brain was negative. In the emergency room he vomited and aspirated. He was intubated secondary to this event.   LKW: 11/27 prior to going to bed  tpa given?: no, outside of window   08/09/2013: Dr. Corliss Skains S/P 4 vessle cerebral arteriogram.  RT CFA approach.  Findings 1.(5 % stenosis RT ICA prox.  2.90% stenosis of both Vert artery origins  3.70%stenosis of RT ICA distal cervical segment.  Although having improved mental status, patient has severe dysarthria, left tongue deviation and mild right hemiparesis. Transferred to stroke service.  SUBJECTIVE Patient more alert, communicative today.  Expressive aphasia. Patient afebrile. CXR with aspiration PNA, on abx, today is day #8.   OBJECTIVE Most recent Vital Signs: Filed Vitals:   08/11/13 1655 08/11/13 1944 08/12/13 0300 08/12/13 0700  BP: 126/71 131/69 118/74 117/73  Pulse: 102 98 72 73  Temp: 98.5 F (36.9 C) 98.4 F (36.9 C) 97.5 F (36.4 C) 98 F (36.7 C)  TempSrc: Oral Oral Oral Oral  Resp: 18 18 20 20   Height:      Weight:    54.205 kg (119 lb 8 oz)  SpO2: 100% 100% 95% 93%   CBG (last 3)   Recent Labs  08/11/13 1647 08/11/13 2114 08/12/13 0657  GLUCAP 78 105* 80    IV Fluid Intake:   . dextrose 5 % and 0.45% NaCl 50 mL/hr at 08/11/13 1937  . propofol Stopped (08/06/13 4782)    MEDICATIONS  . aspirin EC  81 mg Oral Daily  . atorvastatin  40 mg Oral Daily  . chlorhexidine  15 mL Mouth/Throat Q12H  . clopidogrel  75 mg Oral Daily  . famotidine  20 mg  Oral Daily  . heparin  5,000 Units Subcutaneous Q8H  . insulin aspart  0-9 Units Subcutaneous TID WC  . piperacillin-tazobactam (ZOSYN)  IV  3.375 g Intravenous Q8H  . sodium chloride  3 mL Intravenous Q12H   PRN:  acetaminophen, acetaminophen, albuterol, ipratropium, metoprolol, ondansetron (ZOFRAN) IV, ondansetron  Diet:  Dysphagia 3 with thin liquids Activity:   Up with assistance DVT prophy: Heparin SQ  CLINICALLY SIGNIFICANT STUDIES Basic Metabolic Panel:   Recent Labs Lab 08/07/13 0419 08/08/13 0352 08/09/13 0416  NA 138 139 136  K 3.6 3.8 3.6  CL 104 105 104  CO2 22 22 23   GLUCOSE 73 69* 127*  BUN 19 18 16   CREATININE 0.87 0.87 0.98  CALCIUM 8.3* 8.4 8.4  MG 2.0 1.9  --   PHOS 2.7 2.2*  --    Liver Function Tests:   Recent Labs Lab 08/05/13 1053 08/05/13 1645  AST 19 24  ALT 9 9  ALKPHOS 84 84  BILITOT 0.4 0.8  PROT 7.4 7.3  ALBUMIN 2.7* 2.8*   CBC:  Recent Labs Lab 08/05/13 1053  08/06/13 0310  08/08/13 0352 08/09/13 0416  WBC 13.8*  --  12.6*  < > 7.3 7.0  NEUTROABS 12.4*  --  10.2*  --   --   --  HGB 12.6*  < > 12.1*  < > 11.0* 10.3*  HCT 37.2*  < > 36.5*  < > 32.5* 31.3*  MCV 97.9  --  99.7  < > 98.5 98.7  PLT 243  --  215  < > 216 213  < > = values in this interval not displayed. Coagulation:   Recent Labs Lab 08/05/13 1053  LABPROT 14.0  INR 1.10   Cardiac Enzymes:   Recent Labs Lab 08/05/13 1349 08/05/13 1627 08/05/13 2235 08/06/13 0310  CKTOTAL 92  --   --   --   TROPONINI  --  <0.30 <0.30 <0.30   Urinalysis:   Recent Labs Lab 08/05/13 1124  COLORURINE YELLOW  LABSPEC 1.022  PHURINE 6.5  GLUCOSEU NEGATIVE  HGBUR NEGATIVE  BILIRUBINUR NEGATIVE  KETONESUR NEGATIVE  PROTEINUR 30*  UROBILINOGEN 1.0  NITRITE NEGATIVE  LEUKOCYTESUR NEGATIVE   Lipid Panel    Component Value Date/Time   CHOL 155 08/08/2013 0352   TRIG 86 08/08/2013 0352   HDL 64 08/08/2013 0352   CHOLHDL 2.4 08/08/2013 0352   VLDL 17 08/08/2013  0352   LDLCALC 74 08/08/2013 0352   HgbA1C  Lab Results  Component Value Date   HGBA1C 5.6 08/07/2013    Urine Drug Screen:     Component Value Date/Time   LABOPIA NONE DETECTED 08/05/2013 1124   COCAINSCRNUR NONE DETECTED 08/05/2013 1124   LABBENZ NONE DETECTED 08/05/2013 1124   AMPHETMU NONE DETECTED 08/05/2013 1124   THCU NONE DETECTED 08/05/2013 1124   LABBARB NONE DETECTED 08/05/2013 1124    Alcohol Level:   Recent Labs Lab 08/05/13 1053  ETH <11    Ct Head Wo Contrast 08/07/2013   1. 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 Head CT is unchanged from 08/05/2013. 2. Diffuse cerebral atrophy and chronic white matter ischemic change. 3. Soft tissue densities in the external auditory canals bilaterally. This is most likely cerumen, however direct visualization is suggested.     Dg Swallowing Func-speech Pathology 08/08/2013   Breck Coons [place], CCC-SLP     08/08/2013  1:49 PM Objective Swallowing Evaluation: Modified Barium Swallowing Study   Patient Details  Name: [name] MRN: [id number] Date of Birth: [birth year]  Today's Date: 08/08/2013 Time: 1130-1150 SLP Time Calculation (min): 20 min  Past Medical History:  Past Medical History  Diagnosis Date  . Coronary artery disease     s/p PCI of LAD with BMS 04/03/06 at [facility]  . Hyperlipidemia   . Hypertension   . Cerebrovascular accident     history of cerebrovascular accident with residual left  hemiparesis in 2002, seen at [facility]  . Multiple sclerosis   . Ischemic cardiomyopathy     s/p BiV ICD implanted 2007 at [facility]  . Chronic systolic congestive heart failure   . Bipolar disorder    Past Surgical History:  Past Surgical History  Procedure Laterality Date  . Tonsillectomy    . Vasectomy    . Implant of automatic cardioverter defibrillator      December 26, 2004 MDT BiV ICD implanted by Dr Chales Abrahams in [facility]  . Ptca      PCI LAD with BMS 04/03/06   HPI:  67 yo male adm to [facility]  with AMS, severe dysarthria, has h/o right  MCA CVA.  Pt with episode of emesis and required intubation  11/28-11/29.  Bedside swallow evaluation recommended an MBS.       Assessment /  Plan / Recommendation Clinical Impression  Dysphagia Diagnosis: Mild pharyngeal phase dysphagia Clinical impression: Pt. exhibited min-mild sensory based  pharyngeal dysphagia.  Initiation of swallow was delayed to pt.'s  pyriform sinuses with liquid consistencies caused by decreased  sensation.  Protection of laryngeal vestibule and trachea was  adequate without penetration or aspiration observed.  Pharyngeal  residue was min-mild and intermittent.  Aspiration risk appears  mild per clinical observations during study, however pt.'s  awareness and cognitive status appears decreased.  SLP recommends  a regular texture diet and thin liquids, straw ok, sit upright,  small sips and pills whole in applesauce. ST will continue to see  for safety with recommendations.     Treatment Recommendation  Therapy as outlined in treatment plan below    Diet Recommendation Thin liquid;Regular   Liquid Administration via: Cup;Straw Medication Administration: Whole meds with puree Supervision: Patient able to self feed;Full supervision/cueing  for compensatory strategies Compensations: Slow rate;Small sips/bites Postural Changes and/or Swallow Maneuvers: Seated upright 90  degrees    Other  Recommendations Oral Care Recommendations: Oral care BID   Follow Up Recommendations  None    Frequency and Duration min 2x/week  2 weeks   Pertinent Vitals/Pain No pain            Reason for Referral Objectively evaluate swallowing function   Oral Phase Oral Preparation/Oral Phase Oral Phase: WFL   Pharyngeal Phase Pharyngeal Phase Pharyngeal Phase: Impaired Pharyngeal - Honey Pharyngeal - Honey Teaspoon: Delayed swallow initiation;Premature  spillage to pyriform sinuses Pharyngeal - Honey Cup: Delayed swallow initiation;Premature  spillage to pyriform  sinuses;Pharyngeal residue -  valleculae;Reduced tongue base retraction Pharyngeal - Nectar Pharyngeal - Nectar Teaspoon: Delayed swallow  initiation;Premature spillage to pyriform sinuses;Pharyngeal  residue - valleculae;Reduced tongue base retraction Pharyngeal - Nectar Cup: Delayed swallow initiation;Premature  spillage to pyriform sinuses;Pharyngeal residue -  valleculae;Reduced tongue base retraction Pharyngeal - Thin Pharyngeal - Thin Teaspoon: Premature spillage to pyriform  sinuses;Delayed swallow initiation;Pharyngeal residue -  valleculae;Reduced tongue base retraction Pharyngeal - Thin Cup: Delayed swallow initiation;Premature  spillage to pyriform sinuses;Pharyngeal residue -  valleculae;Reduced tongue base retraction Pharyngeal - Thin Straw: Delayed swallow initiation;Premature  spillage to pyriform sinuses;Pharyngeal residue -  valleculae;Reduced tongue base retraction Pharyngeal - Solids Pharyngeal - Puree: Within functional limits Pharyngeal - Regular: Within functional limits  Cervical Esophageal Phase    GO    Cervical Esophageal Phase Cervical Esophageal Phase: [facility]         Darrow Bussing.Ed CCC-SLP Pager [phone]  08/08/2013    EEG  This EEG is consistent with a right occipital lobe dysfunction likely secondary to patient's previous stroke. There was no seizure or seizure predisposition recorded on this study  08/09/2013: Dr. Corliss Skains S/P 4 vessle cerebral arteriogram.  RT CFA approach.  Findings 1.(5 % stenosis RT ICA prox.  2.90% stenosis of both Vert artery origins  3.70%stenosis of RT ICA distal cervical segment  2D Echocardiogram  EF 20% with diffuse HK, Left atrium mod-severely dilated. No cardiac source of embolism identified.                                    (previous echo, 10/2011 showed EF 25%)  Carotid Doppler see angio   CXR  08/11/2013 Patchy densities in R lung base suggestive for PNA/aspiration  EKG  normal sinus rhythm.   Therapy Recommendations  SNF  Physical Exam   General:  In bed, intubated, slightly cachectic  CV: Regular rate and rhythm  Mental Status:  Patient is awake and interactive today. Has moderate dysarthria but can be understood with some difficulty. Able to answer names and follows simple commands. Not able to give me his son's telephone number. Cranial Nerves:  II: Does not blink to threat. Pupils are equal, round, and reactive to light. Discs are difficult to visualize.  III,IV, VI: No clear reaction to doll's eye, but eyes are conjugate.  V/VII: Corneals are intact  VIII, X, XI, XII: Unable to assess secondary to patient's altered mental status.  Motor:  Tone is normal. He does withdraw to noxious stimuli bilaterally and arms and legs mild weakness of the right leg. Sensory:  As above  Deep Tendon Reflexes:  2+ and symmetric in the biceps and patellae.  Cerebellar:  Unable to assess secondary to patient's altered mental status.  Gait:  Unable to assess secondary to patient's altered mental status.    ASSESSMENT Mr. LARRELL RAPOZO is a 67 y.o. male presenting with dysarthria and mild right leg weakness likely due to a small left brain stem infarct not visualized on CT scan and MRI cannot be done because of pacemaker Imaging confirms an old right temporoparietal infarct. 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  On no antithrombotics prior to admission. Now on clopidogrel 75 mg orally every day for secondary stroke prevention. Plans for intervention as below.   Hyperlipidemina, LDL goal < 70, at goal, continue statin  Cardiomyopathy, has ICD, therefore no MRI  Abnormal CXR 08/11/2013 concerning for aspiration pneumonia, on abx day # 8   Hospital day # 7  TREATMENT/PLAN  Continue clopidogrel 75 mg orally every day for secondary stroke  prevention.  Continue Atorvastatin 40 daily for secondary stroke prevention  Patient's son desires treatment of patient's vascular disease. We have discussed with Dr. Corliss Skains patient will have possible IR procedure 08/12/2013 and then to SNF 1-2 days post. If unable to have vascular stenting on 08/12/2013, plans would be to go ahead and discharge to SNF and bring patient back in from outpatient setting in 7-10 days. As concern for aspiration PNA, Dr. Corliss Skains has asked to have the patient return after treatment. He recommends continuing aspirin/plavix daily. IR team will coordinate return.  Now concern for aspiration PNA on chest xray. He has been on abx for 8 days at this point. Further treatment per primary team.  Previous telephonic discussion with patient's son in [place] about his stroke, results of cerebral catheter angiogram and treatment options including medical therapy alone versus angioplasty stenting. He prefers left vertebral artery origin angioplasty and stenting at the present time. Patient is likely going to go to skilled nursing facility for rehabilitation. ( He may move to the [place] area to be close to his son and further neurovascular revascularization may have to be done there).  Patient for discharge today.  Gwendolyn Lima. Manson Passey, [facility], MBA, MHA [facility] Pager: [phone] 08/12/2013 8:24 AM  I have personally obtained a history, examined the patient, evaluated imaging results, and formulated the assessment and plan of care. I agree with the above.  Delia Heady, MD

## 2013-08-12 NOTE — Progress Notes (Signed)
Subjective: Awake and alert. Voices no complaints. He is aware of the plan for SNF, return for IR intervention.  Objective: Lab: No results found for this basename: WBC, NEUTROABS, HGB, HCT, MCV, PLT,  in the last 72 hours No results found for this basename: NA, K, CL, CO3, GLUCOSE, BUN, CREATININE, CALCIUM, MG, PHOS,  in the last 72 hours  Imaging: 08/11/13 CXR: CLINICAL DATA: Aspiration pneumonia  EXAM:  CHEST 2 VIEW  COMPARISON: Chest x-ray dated August 06, 2013.  FINDINGS:  There it is progressive increased density at the right lung base  consistent with aspiration pneumonia. The right hemidiaphragm is  obscured. The left lung is clear. The cardiopericardial silhouette  is normal in size. The permanent pacemaker defibrillator is  unchanged in appearance. The endotracheal tube and the  esophagogastric tubes have been withdrawn.  IMPRESSION:  There is progressive increased density at the right lung base  consistent with the clinically suspected aspiration pneumonia.  Scheduled Meds: . aspirin EC  81 mg Oral Daily  . atorvastatin  40 mg Oral Daily  . chlorhexidine  15 mL Mouth/Throat Q12H  . clopidogrel  75 mg Oral Daily  . famotidine  20 mg Oral Daily  . heparin  5,000 Units Subcutaneous Q8H  . insulin aspart  0-9 Units Subcutaneous TID WC  . piperacillin-tazobactam (ZOSYN)  IV  3.375 g Intravenous Q8H  . sodium chloride  3 mL Intravenous Q12H   Continuous Infusions: . dextrose 5 % and 0.45% NaCl 50 mL/hr at 08/11/13 1937  . propofol Stopped (08/06/13 0742)   PRN Meds:.acetaminophen, acetaminophen, albuterol, ipratropium, metoprolol, ondansetron (ZOFRAN) IV, ondansetron   Physical Exam: Filed Vitals:   08/12/13 0300  BP: 118/74  Pulse: 72  Temp: 97.5 F (36.4 C)  Resp: 20   See d/c summary     Assessment/Plan: Medically stable and ready for discharge.  Priority dictation # [phone] plus two addendums, also priority, that deal with medication restart and  clarification. FL-2 signed. Out of facility order signed.    Illene Regulus [place] [facility] (o) [phone]; (c) [phone] Call-grp - [facility]  Tele: [phone]  08/12/2013, 7:34 AM

## 2013-08-12 NOTE — Discharge Summary (Signed)
NAMEJAHMEEK, SHIRK NO.:  [id number]  MEDICAL RECORD NO.:  [id number]  LOCATION:  [id number]                        FACILITY:  [facility]  PHYSICIAN:  Rosalyn Gess. Jerilynn Feldmeier, MD  DATE OF BIRTH:  1945-11-02  DATE OF ADMISSION:  08/05/2013 DATE OF DISCHARGE:                              DISCHARGE SUMMARY   ADDENDUM:  After reviewing the patient's chart in more detail, we will resume the patient's Lipitor 40 mg once daily for management of his lipids and his peripheral vascular disease.     Rosalyn Gess Addiel Mccardle, MD     MEN/MEDQ  D:  08/12/2013  T:  08/12/2013  Job:  [id number]

## 2013-08-12 NOTE — Clinical Social Work Note (Signed)
Pt to be discharged to [facility] in [place]. CSW contacted [facility] for ambulance transportation. Discharge packet complete and placed on shadow chart. Discharge information also sent electronically to facility. RN updated on information above. Report number given to RN.  Darlyn Chamber, LCSWA Clinical Social Worker [phone]

## 2013-08-12 NOTE — Progress Notes (Signed)
Physical Therapy Treatment Patient Details Name: [name] MRN: [id number] DOB: [birth year] Today's Date: 08/12/2013 Time: 8657-8469 PT Time Calculation (min): 25 min  PT Assessment / Plan / Recommendation  History of Present Illness Pt adm with AMS and resp failure. Work-up in progress.   PT Comments   Patient with some ambulation today with increased assist. Tolerated there-ex well. Assisted patient with feeding during rest break (applesauce). Will continue to see and progress as tolerated.  Follow Up Recommendations  SNF           Equipment Recommendations  Other (comment) (TBD)       Frequency Min 2X/week   Progress towards PT Goals Progress towards PT goals: Progressing toward goals  Plan Current plan remains appropriate    Precautions / Restrictions Precautions Precautions: Fall Restrictions Weight Bearing Restrictions: No   Pertinent Vitals/Pain No pain reported at this time    Mobility  Bed Mobility Bed Mobility: Supine to Sit;Sitting - Scoot to Edge of Bed Supine to Sit: 3: Mod assist Sitting - Scoot to Edge of Bed: 4: Min assist Details for Bed Mobility Assistance: VCs for positioning, assist to elevate to sitting, assit to rotate trunk to EOB. Transfers Transfers: Sit to Stand;Stand to Sit Sit to Stand: 3: Mod assist Stand to Sit: 3: Mod assist Details for Transfer Assistance: Assist to elevate to standing, assist for stability, poor control of descent to chair Ambulation/Gait Ambulation/Gait Assistance: 3: Mod assist Ambulation Distance (Feet): 34 Feet (in room ambulation with rest breaks) Assistive device: Rolling walker Ambulation/Gait Assistance Details: Mod to max assist for upright and stability, some manual assist to advance RLE. One extended seated rest break and one standing rest break at counter height.   Gait Pattern: Step-to pattern;Decreased stride length;Shuffle;Trunk flexed Gait velocity: decreased    Exercises General Exercises - Lower  Extremity Ankle Circles/Pumps: AROM;Both;10 reps Long Arc Quad: AROM;Both;10 reps Hip ABduction/ADduction: AROM;Both;10 reps Hip Flexion/Marching: AROM;Both;10 reps    PT Goals (current goals can now be found in the care plan section) Acute Rehab PT Goals Patient Stated Goal: none stated PT Goal Formulation: Patient unable to participate in goal setting Time For Goal Achievement: 08/15/13 Potential to Achieve Goals: Fair  Visit Information  Last PT Received On: 08/12/13 Assistance Needed: +1 History of Present Illness: Pt adm with AMS and resp failure. Work-up in progress.    Subjective Data  Subjective: I want more (GE:[id number]) Patient Stated Goal: none stated   Cognition  Cognition Arousal/Alertness: Awake/alert Behavior During Therapy: Restless Overall Cognitive Status: Difficult to assess Difficult to assess due to: Impaired Tax inspector Sitting Balance Static Sitting - Balance Support: Feet supported Static Sitting - Level of Assistance: 5: Stand by assistance Static Sitting - Comment/# of Minutes: seated EOB for ther-ex Dynamic Sitting Balance Dynamic Sitting - Balance Support: Feet supported;During functional activity Dynamic Sitting - Level of Assistance: 4: Min assist Dynamic Sitting - Balance Activities: Lateral lean/weight shifting;Forward lean/weight shifting;Head control activities;Trunk control activities  End of Session PT - End of Session Equipment Utilized During Treatment: Gait belt Activity Tolerance: Patient limited by fatigue Patient left: in chair;with call bell/phone within reach;with chair alarm set Nurse Communication: Mobility status   GP     Fabio Asa 08/12/2013, 2:28 PM Charlotte Crumb, PT DPT  [phone]

## 2013-08-12 NOTE — Progress Notes (Signed)
Pharmacist Heart Failure Core Measure Documentation  Assessment: [name] has an EF documented as 20% on 08/07/2013 by Echo.  Rationale: Heart failure patients with left ventricular systolic dysfunction (LVSD) and an EF < 40% should be prescribed an angiotensin converting enzyme inhibitor (ACEI) or angiotensin receptor blocker (ARB) at discharge unless a contraindication is documented in the medical record.  This patient is not currently on an ACEI or ARB for HF.  This note is being placed in the record in order to provide documentation that a contraindication to the use of these agents is present for this encounter.  ACE Inhibitor or Angiotensin Receptor Blocker is contraindicated (specify all that apply)  []   ACEI allergy AND ARB allergy []   Angioedema []   Moderate or severe aortic stenosis []   Hyperkalemia [x]   Hypotension []   Renal artery stenosis []   Worsening renal function, preexisting renal disease or dysfunction  Lotensin resumed after discharge.     Riki Rusk 08/12/2013 11:19 AM

## 2013-09-26 ENCOUNTER — Ambulatory Visit: Payer: Medicare Other | Admitting: Internal Medicine

## 2013-10-24 ENCOUNTER — Telehealth (HOSPITAL_COMMUNITY): Payer: Self-pay | Admitting: Interventional Radiology

## 2013-10-24 NOTE — Telephone Encounter (Signed)
Tried to call pt again, left a message with someone who answered the phone who said "I don't know what to tell you but you can call back."

## 2013-12-12 ENCOUNTER — Encounter: Payer: Self-pay | Admitting: *Deleted

## 2014-01-10 ENCOUNTER — Encounter: Payer: Self-pay | Admitting: Internal Medicine

## 2014-01-10 ENCOUNTER — Telehealth: Payer: Self-pay | Admitting: Internal Medicine

## 2014-01-10 NOTE — Telephone Encounter (Signed)
01-10-13 SENT PAST DUE LETTER, NEEDS DEFIB CK WITH [employer]/MT

## 2014-02-15 ENCOUNTER — Encounter: Payer: Self-pay | Admitting: *Deleted

## 2014-02-16 ENCOUNTER — Telehealth: Payer: Self-pay | Admitting: Cardiology

## 2014-02-16 NOTE — Telephone Encounter (Signed)
Certified letter sent 

## 2014-05-21 ENCOUNTER — Emergency Department (HOSPITAL_COMMUNITY): Payer: Medicare Other

## 2014-05-21 ENCOUNTER — Inpatient Hospital Stay (HOSPITAL_COMMUNITY)
Admission: EM | Admit: 2014-05-21 | Discharge: 2014-05-23 | DRG: 640 | Disposition: A | Discharge status: Home / Self Care | Payer: Medicare Other | Attending: Family Medicine | Admitting: Family Medicine

## 2014-05-21 ENCOUNTER — Encounter (HOSPITAL_COMMUNITY): Payer: Self-pay | Admitting: Emergency Medicine

## 2014-05-21 DIAGNOSIS — F1021 Alcohol dependence, in remission: Secondary | ICD-10-CM

## 2014-05-21 DIAGNOSIS — Z7982 Long term (current) use of aspirin: Secondary | ICD-10-CM

## 2014-05-21 DIAGNOSIS — I10 Essential (primary) hypertension: Secondary | ICD-10-CM | POA: Diagnosis present

## 2014-05-21 DIAGNOSIS — E785 Hyperlipidemia, unspecified: Secondary | ICD-10-CM | POA: Diagnosis present

## 2014-05-21 DIAGNOSIS — Z79899 Other long term (current) drug therapy: Secondary | ICD-10-CM

## 2014-05-21 DIAGNOSIS — W19XXXA Unspecified fall, initial encounter: Secondary | ICD-10-CM

## 2014-05-21 DIAGNOSIS — F17200 Nicotine dependence, unspecified, uncomplicated: Secondary | ICD-10-CM | POA: Diagnosis present

## 2014-05-21 DIAGNOSIS — Z681 Body mass index (BMI) 19 or less, adult: Secondary | ICD-10-CM | POA: Diagnosis not present

## 2014-05-21 DIAGNOSIS — Z9119 Patient's noncompliance with other medical treatment and regimen: Secondary | ICD-10-CM

## 2014-05-21 DIAGNOSIS — G35 Multiple sclerosis: Secondary | ICD-10-CM

## 2014-05-21 DIAGNOSIS — Z8673 Personal history of transient ischemic attack (TIA), and cerebral infarction without residual deficits: Secondary | ICD-10-CM

## 2014-05-21 DIAGNOSIS — S2231XA Fracture of one rib, right side, initial encounter for closed fracture: Secondary | ICD-10-CM

## 2014-05-21 DIAGNOSIS — I5022 Chronic systolic (congestive) heart failure: Secondary | ICD-10-CM | POA: Diagnosis present

## 2014-05-21 DIAGNOSIS — I428 Other cardiomyopathies: Secondary | ICD-10-CM

## 2014-05-21 DIAGNOSIS — Z95810 Presence of automatic (implantable) cardiac defibrillator: Secondary | ICD-10-CM

## 2014-05-21 DIAGNOSIS — I2589 Other forms of chronic ischemic heart disease: Secondary | ICD-10-CM | POA: Diagnosis present

## 2014-05-21 DIAGNOSIS — Z7902 Long term (current) use of antithrombotics/antiplatelets: Secondary | ICD-10-CM | POA: Diagnosis not present

## 2014-05-21 DIAGNOSIS — R627 Adult failure to thrive: Secondary | ICD-10-CM | POA: Diagnosis present

## 2014-05-21 DIAGNOSIS — R6889 Other general symptoms and signs: Secondary | ICD-10-CM | POA: Diagnosis present

## 2014-05-21 DIAGNOSIS — E43 Unspecified severe protein-calorie malnutrition: Secondary | ICD-10-CM

## 2014-05-21 DIAGNOSIS — K4090 Unilateral inguinal hernia, without obstruction or gangrene, not specified as recurrent: Secondary | ICD-10-CM

## 2014-05-21 DIAGNOSIS — I2510 Atherosclerotic heart disease of native coronary artery without angina pectoris: Secondary | ICD-10-CM | POA: Diagnosis present

## 2014-05-21 DIAGNOSIS — Z91199 Patient's noncompliance with other medical treatment and regimen due to unspecified reason: Secondary | ICD-10-CM | POA: Diagnosis not present

## 2014-05-21 DIAGNOSIS — I519 Heart disease, unspecified: Secondary | ICD-10-CM

## 2014-05-21 DIAGNOSIS — S2249XA Multiple fractures of ribs, unspecified side, initial encounter for closed fracture: Secondary | ICD-10-CM | POA: Diagnosis present

## 2014-05-21 DIAGNOSIS — F319 Bipolar disorder, unspecified: Secondary | ICD-10-CM

## 2014-05-21 DIAGNOSIS — Z9181 History of falling: Secondary | ICD-10-CM | POA: Diagnosis not present

## 2014-05-21 DIAGNOSIS — Z720 Tobacco use: Secondary | ICD-10-CM

## 2014-05-21 DIAGNOSIS — Z9861 Coronary angioplasty status: Secondary | ICD-10-CM

## 2014-05-21 DIAGNOSIS — Y92009 Unspecified place in unspecified non-institutional (private) residence as the place of occurrence of the external cause: Secondary | ICD-10-CM | POA: Diagnosis not present

## 2014-05-21 DIAGNOSIS — G9340 Encephalopathy, unspecified: Secondary | ICD-10-CM

## 2014-05-21 DIAGNOSIS — R4189 Other symptoms and signs involving cognitive functions and awareness: Secondary | ICD-10-CM

## 2014-05-21 LAB — COMPREHENSIVE METABOLIC PANEL
ALK PHOS: 100 U/L (ref 39–117)
AST: 14 U/L (ref 0–37)
Albumin: 3.2 g/dL — ABNORMAL LOW (ref 3.5–5.2)
Anion gap: 13 (ref 5–15)
BUN: 17 mg/dL (ref 6–23)
CHLORIDE: 104 meq/L (ref 96–112)
CO2: 23 meq/L (ref 19–32)
Calcium: 9 mg/dL (ref 8.4–10.5)
Creatinine, Ser: 0.85 mg/dL (ref 0.50–1.35)
GFR calc Af Amer: >90 mL/min (ref >90)
GFR, EST NON AFRICAN AMERICAN: 89 mL/min — AB (ref >90)
GLUCOSE: 94 mg/dL (ref 70–99)
Potassium: 3.7 mEq/L (ref 3.7–5.3)
SODIUM: 140 meq/L (ref 137–147)
Total Bilirubin: 0.5 mg/dL (ref 0.3–1.2)
Total Protein: 7.1 g/dL (ref 6.0–8.3)

## 2014-05-21 LAB — CBC
HCT: 40.6 % (ref 39.0–52.0)
HEMOGLOBIN: 13.6 g/dL (ref 13.0–17.0)
MCH: 32.2 pg (ref 26.0–34.0)
MCHC: 33.5 g/dL (ref 30.0–36.0)
MCV: 96.2 fL (ref 78.0–100.0)
PLATELETS: 194 10*3/uL (ref 150–400)
RBC: 4.22 MIL/uL (ref 4.22–5.81)
RDW: 13 % (ref 11.5–15.5)
WBC: 6.6 10*3/uL (ref 4.0–10.5)

## 2014-05-21 LAB — URINALYSIS, ROUTINE W REFLEX MICROSCOPIC
Glucose, UA: NEGATIVE mg/dL
Hgb urine dipstick: NEGATIVE
Ketones, ur: 15 mg/dL — AB
LEUKOCYTES UA: NEGATIVE
Nitrite: NEGATIVE
PH: 6 (ref 5.0–8.0)
Protein, ur: NEGATIVE mg/dL
Specific Gravity, Urine: 1.025 (ref 1.005–1.030)
Urobilinogen, UA: 1 mg/dL (ref 0.0–1.0)

## 2014-05-21 LAB — I-STAT CG4 LACTIC ACID, ED: Lactic Acid, Venous: 1.34 mmol/L (ref 0.5–2.2)

## 2014-05-21 MED ORDER — SPIRONOLACTONE 25 MG PO TABS
25.0000 mg | ORAL_TABLET | Freq: Every day | ORAL | Status: DC
  Administered 2014-05-22 – 2014-05-23 (×2): 25 mg via ORAL
  Filled 2014-05-21 (×3): qty 1

## 2014-05-21 MED ORDER — ATORVASTATIN CALCIUM 40 MG PO TABS
40.0000 mg | ORAL_TABLET | Freq: Every day | ORAL | Status: DC
  Administered 2014-05-22 – 2014-05-23 (×2): 40 mg via ORAL
  Filled 2014-05-21 (×3): qty 1

## 2014-05-21 MED ORDER — ASPIRIN EC 81 MG PO TBEC
81.0000 mg | DELAYED_RELEASE_TABLET | Freq: Every day | ORAL | Status: DC
  Administered 2014-05-22 – 2014-05-23 (×2): 81 mg via ORAL
  Filled 2014-05-21 (×3): qty 1

## 2014-05-21 MED ORDER — ONDANSETRON HCL 4 MG PO TABS: 4.0000 mg | ORAL_TABLET | Freq: Four times a day (QID) | ORAL | Status: DC | PRN

## 2014-05-21 MED ORDER — VALPROIC ACID 250 MG PO CAPS
250.0000 mg | ORAL_CAPSULE | Freq: Two times a day (BID) | ORAL | Status: DC
  Administered 2014-05-22 – 2014-05-23 (×3): 250 mg via ORAL
  Filled 2014-05-21 (×6): qty 1

## 2014-05-21 MED ORDER — FLUOXETINE HCL 20 MG PO CAPS
20.0000 mg | ORAL_CAPSULE | Freq: Every day | ORAL | Status: DC
  Administered 2014-05-22 – 2014-05-23 (×2): 20 mg via ORAL
  Filled 2014-05-21 (×3): qty 1

## 2014-05-21 MED ORDER — FAMOTIDINE 20 MG PO TABS
20.0000 mg | ORAL_TABLET | Freq: Every day | ORAL | Status: DC
  Administered 2014-05-22 – 2014-05-23 (×2): 20 mg via ORAL
  Filled 2014-05-21 (×3): qty 1

## 2014-05-21 MED ORDER — CARVEDILOL 25 MG PO TABS
25.0000 mg | ORAL_TABLET | Freq: Two times a day (BID) | ORAL | Status: DC
  Administered 2014-05-22: 25 mg via ORAL
  Filled 2014-05-21 (×3): qty 1

## 2014-05-21 MED ORDER — ACETAMINOPHEN 325 MG PO TABS: 650.0000 mg | ORAL_TABLET | Freq: Four times a day (QID) | ORAL | Status: DC | PRN

## 2014-05-21 MED ORDER — CLOPIDOGREL BISULFATE 75 MG PO TABS
75.0000 mg | ORAL_TABLET | Freq: Every day | ORAL | Status: DC
  Administered 2014-05-22 – 2014-05-23 (×2): 75 mg via ORAL
  Filled 2014-05-21 (×3): qty 1

## 2014-05-21 MED ORDER — HYDROCODONE-ACETAMINOPHEN 5-325 MG PO TABS
1.0000 | ORAL_TABLET | Freq: Once | ORAL | Status: DC
  Filled 2014-05-21: qty 1

## 2014-05-21 MED ORDER — BENAZEPRIL HCL 40 MG PO TABS
40.0000 mg | ORAL_TABLET | Freq: Every day | ORAL | Status: DC
  Administered 2014-05-22 – 2014-05-23 (×2): 40 mg via ORAL
  Filled 2014-05-21 (×3): qty 1

## 2014-05-21 MED ORDER — FUROSEMIDE 20 MG PO TABS
20.0000 mg | ORAL_TABLET | Freq: Every day | ORAL | Status: DC
  Administered 2014-05-22 – 2014-05-23 (×2): 20 mg via ORAL
  Filled 2014-05-21 (×3): qty 1

## 2014-05-21 MED ORDER — ENOXAPARIN SODIUM 40 MG/0.4ML ~~LOC~~ SOLN
40.0000 mg | SUBCUTANEOUS | Status: DC
  Administered 2014-05-22 – 2014-05-23 (×2): 40 mg via SUBCUTANEOUS
  Filled 2014-05-21 (×3): qty 0.4

## 2014-05-21 NOTE — ED Notes (Signed)
Patient arrived with food in beard and hair along with fecal matter noted down his legs to his feet. Patient has a new diaper placed by EMS.

## 2014-05-21 NOTE — ED Notes (Signed)
Patient returned from X-ray 

## 2014-05-21 NOTE — ED Notes (Signed)
Per EMS, Patient lives alone and was found living in a double-wide trailer in unfit conditions. Neighbors called out of concern. Patient has no complaints except, "I cannot live like this any longer." Vitals per EMS: 150/114, 76 Irregular HR. Patient has bruises and scabs noted from falls that the patient reports he has often. Patient has history of MS and Depression.

## 2014-05-21 NOTE — ED Notes (Signed)
Attempted to call report

## 2014-05-21 NOTE — ED Provider Notes (Signed)
CSN: [id number]     Arrival date & time 05/21/14  1637 History   First MD Initiated Contact with Patient 05/21/14 1639     Chief Complaint  Patient presents with  . Failure To Thrive     (Consider location/radiation/quality/duration/timing/severity/associated sxs/prior Treatment) Patient is a 68 y.o. male presenting with general illness. The history is provided by the patient.  Illness Location:  Home Quality:  Unable to care for himself Severity:  Severe Onset quality:  Gradual Timing:  Constant Progression:  Worsening Chronicity:  New Context:  MS, prior stroke, has family friend help with daily care Relieved by:  Nothing Worsened by:  Nothing Associated symptoms: abdominal pain   Associated symptoms: no cough, no fever, no shortness of breath and no vomiting     Past Medical History  Diagnosis Date  . Coronary artery disease     s/p PCI of LAD with BMS 04/03/06 at [facility]  . Hyperlipidemia   . Hypertension   . Cerebrovascular accident     history of cerebrovascular accident with residual left hemiparesis in 2002, seen at [facility]  . Multiple sclerosis   . Ischemic cardiomyopathy     s/p BiV ICD implanted 2007 at [facility]  . Chronic systolic congestive heart failure   . Bipolar disorder    Past Surgical History  Procedure Laterality Date  . Tonsillectomy    . Vasectomy    . Implant of automatic cardioverter defibrillator      December 26, 2004 MDT BiV ICD implanted by Dr Chales Abrahams in [facility]  . Ptca      PCI LAD with BMS 04/03/06   Family History  Problem Relation Age of Onset  . Heart disease Mother   . Kidney disease Father   . COPD Sister   . Cancer Neg Hx   . Diabetes Neg Hx    History  Substance Use Topics  . Smoking status: Former Smoker -- 0.50 packs/day for 40 years    Types: Cigarettes    Quit date: 10/13/2011  . Smokeless tobacco: Never Used     Comment: has tried to quit "many times" will continue to try to quit   . Alcohol Use: Yes     Comment: remote heavy ETOH, "years ago"    Review of Systems  Constitutional: Negative for fever and chills.  Respiratory: Negative for cough and shortness of breath.   Gastrointestinal: Positive for abdominal pain. Negative for vomiting.  All other systems reviewed and are negative.     Allergies  Meperidine hcl  Home Medications   Prior to Admission medications   Medication Sig Start Date End Date Taking? Authorizing Provider  acetaminophen (TYLENOL) 325 MG tablet Take 2 tablets (650 mg total) by mouth every 6 (six) hours as needed for mild pain (or Fever >/= 101). 08/12/13   Jacques Navy, MD  aspirin EC 81 MG EC tablet Take 1 tablet (81 mg total) by mouth daily. 08/12/13   Jacques Navy, MD  atorvastatin (LIPITOR) 40 MG tablet Take 1 tablet (40 mg total) by mouth daily. 08/12/13   Jacques Navy, MD  benazepril (LOTENSIN) 40 MG tablet Take 1 tablet (40 mg total) by mouth daily. 08/12/13   Jacques Navy, MD  carvedilol (COREG) 25 MG tablet Take 1 tablet (25 mg total) by mouth 2 (two) times daily with a meal. 08/12/13   Jacques Navy, MD  clopidogrel (PLAVIX) 75 MG tablet Take 1 tablet (75 mg  total) by mouth daily. 08/12/13   Jacques Navy, MD  famotidine (PEPCID) 20 MG tablet Take 1 tablet (20 mg total) by mouth daily. 08/12/13   Jacques Navy, MD  FLUoxetine (PROZAC) 20 MG capsule Take 1 capsule (20 mg total) by mouth daily. 08/12/13   Jacques Navy, MD  furosemide (LASIX) 20 MG tablet Take 1 tablet (20 mg total) by mouth daily. 08/12/13   Jacques Navy, MD  ondansetron (ZOFRAN) 4 MG tablet Take 1 tablet (4 mg total) by mouth every 6 (six) hours as needed for nausea. 08/12/13   Jacques Navy, MD  spironolactone (ALDACTONE) 25 MG tablet Take 1 tablet (25 mg total) by mouth daily. 08/12/13   Jacques Navy, MD  valproic acid (DEPAKENE) 250 MG capsule Take 1 capsule (250 mg total) by mouth 2 (two) times daily. 08/12/13   Jacques Navy, MD    BP 155/124  Pulse 84  Temp(Src) 97.6 F (36.4 C) (Oral)  Resp 14  SpO2 94% Physical Exam  Nursing note and vitals reviewed. Constitutional: He is oriented to person, place, and time. He appears well-developed and well-nourished. No distress.  Covered in his own feces Dissheveled  HENT:  Head: Normocephalic and atraumatic.  Mouth/Throat: No oropharyngeal exudate.  Eyes: EOM are normal. Pupils are equal, round, and reactive to light.  Neck: Normal range of motion. Neck supple.  Cardiovascular: Normal rate and regular rhythm.  Exam reveals no friction rub.   No murmur heard. Pulmonary/Chest: Effort normal and breath sounds normal. No respiratory distress. He has no wheezes. He has no rales.  Abdominal: Soft. He exhibits no distension. There is no tenderness. There is no rebound.  Musculoskeletal: Normal range of motion. He exhibits no edema.  Neurological: He is alert and oriented to person, place, and time.  Skin: No rash noted. He is not diaphoretic.    ED Course  Procedures (including critical care time) Labs Review Labs Reviewed  COMPREHENSIVE METABOLIC PANEL - Abnormal; Notable for the following:    Albumin 3.2 (*)    GFR calc non Af Amer 89 (*)    All other components within normal limits  URINALYSIS, ROUTINE W REFLEX MICROSCOPIC - Abnormal; Notable for the following:    Bilirubin Urine SMALL (*)    Ketones, ur 15 (*)    All other components within normal limits  CBC  I-STAT CG4 LACTIC ACID, ED    Imaging Review Dg Chest 2 View  05/21/2014   CLINICAL DATA:  Failure to thrive.  EXAM: CHEST  2 VIEW  COMPARISON:  Chest radiograph 08/11/2013  FINDINGS: Stable enlargement of the cardiopericardial silhouette. Stable mediastinal and hilar contours. Left subclavian AICD with leads terminating in the right atrium, right ventricle, and coronary sinus. Pulmonary vascularity is within normal limits. No airspace disease or pleural effusion. Lungs clear. Negative for pneumothorax.   There are two acute appearing right-sided rib fractures, involving the lateral right ninth and lateral tenth ribs.  IMPRESSION: Two acute appearing right rib fractures (the lateral ninth and tenth ribs).  Cardiomegaly with AICD. No acute cardiopulmonary disease identified.   Electronically Signed   By: Britta Mccreedy M.D.   On: 05/21/2014 18:22     EKG Interpretation None      MDM   Final diagnoses:  Failure to thrive in adult  Fall, initial encounter  Rib fractures, right, closed, initial encounter    25M with hx of MS, depression presents from home. Dissheveled, covered in feces. Concern for  inability to care for himself. Will check labs, consult Case Management. Labs ok, CXR shows 2 rib fractures. Admitted by Medicine.    Elwin Mocha, MD 05/21/14 [phone]

## 2014-05-21 NOTE — H&P (Signed)
[facility] History and Physical  [name]:[id number] DOB: 04/19/1946 DOA: 05/21/2014  PCP: Baltazar Apo, PA-C   Chief Complaint: Poor self-care  HPI: [name] is a 68 y.o. male with a history of bipolar affective disorder, multiple sclerosis, hypertension, coronary artery disease and chronic systolic dysfunction with ejection fraction of 20% status post biventricular ICD placement, and recent CVA was brought to the ED today by EMS covered in his own feces and disheveled. Someone had called EMS the patient is not able to care for himself, patient does not know who called and why, but he agrees that he is not able to take care of himself. He has no complaint. But when examined but found to have multiple scars and the wound scabs on the lower extremities, he reported that he has been falling very frequently at home. He is not able to carry out his activities of daily living including bathing, grooming, cooking, grossly shopping. He claims a neighbor (lady) helps him out occasionally. He has no immediate family in [place] according to patient, a brother lives in [place]. He has personal history of alcohol abuse but he denies current use, he now smokes cigarettes occasionally after 40-pack-year of heavy smoking. He has been losing weight because of poor food intake. Since after his stroke, patient has had difficulty with his speech. He denies headache, no chest pain, no cough, no shortness of breath, no neck swelling.   General: The patient denies anorexia, fever Cardiac: Denies chest pain, syncope, palpitations, pedal edema  Respiratory: Denies cough, shortness of breath, wheezing GI: Denies severe indigestion/heartburn, abdominal pain, nausea, vomiting, diarrhea and constipation GU: Denies hematuria, incontinence, dysuria  Musculoskeletal: Denies arthritis  Neurologic: Denies focal weakness or numbness, change in vision  Past Medical History  Diagnosis Date  . Coronary  artery disease     s/p PCI of LAD with BMS 04/03/06 at [facility]  . Hyperlipidemia   . Hypertension   . Cerebrovascular accident     history of cerebrovascular accident with residual left hemiparesis in 2002, seen at [facility]  . Multiple sclerosis   . Ischemic cardiomyopathy     s/p BiV ICD implanted 2007 at [facility]  . Chronic systolic congestive heart failure   . Bipolar disorder     Past Surgical History  Procedure Laterality Date  . Tonsillectomy    . Vasectomy    . Implant of automatic cardioverter defibrillator      December 26, 2004 MDT BiV ICD implanted by Dr Chales Abrahams in [facility]  . Ptca      PCI LAD with BMS 04/03/06    Social History: History of alcohol and nicotine abuse Lives at home alone Unable to carry out ADLs  Allergies  Allergen Reactions  . Meperidine Hcl Other (See Comments)    unknown    Family History  Problem Relation Age of Onset  . Heart disease Mother   . Kidney disease Father   . COPD Sister   . Cancer Neg Hx   . Diabetes Neg Hx       Prior to Admission medications   Medication Sig Start Date End Date Taking? Authorizing Provider  acetaminophen (TYLENOL) 325 MG tablet Take 2 tablets (650 mg total) by mouth every 6 (six) hours as needed for mild pain (or Fever >/= 101). 08/12/13   Jacques Navy, MD  aspirin EC 81 MG EC tablet Take 1 tablet (81 mg total) by mouth daily. 08/12/13  Jacques Navy, MD  atorvastatin (LIPITOR) 40 MG tablet Take 1 tablet (40 mg total) by mouth daily. 08/12/13   Jacques Navy, MD  benazepril (LOTENSIN) 40 MG tablet Take 1 tablet (40 mg total) by mouth daily. 08/12/13   Jacques Navy, MD  carvedilol (COREG) 25 MG tablet Take 1 tablet (25 mg total) by mouth 2 (two) times daily with a meal. 08/12/13   Jacques Navy, MD  clopidogrel (PLAVIX) 75 MG tablet Take 1 tablet (75 mg total) by mouth daily. 08/12/13   Jacques Navy, MD  famotidine (PEPCID) 20 MG tablet Take 1 tablet (20 mg  total) by mouth daily. 08/12/13   Jacques Navy, MD  FLUoxetine (PROZAC) 20 MG capsule Take 1 capsule (20 mg total) by mouth daily. 08/12/13   Jacques Navy, MD  furosemide (LASIX) 20 MG tablet Take 1 tablet (20 mg total) by mouth daily. 08/12/13   Jacques Navy, MD  ondansetron (ZOFRAN) 4 MG tablet Take 1 tablet (4 mg total) by mouth every 6 (six) hours as needed for nausea. 08/12/13   Jacques Navy, MD  spironolactone (ALDACTONE) 25 MG tablet Take 1 tablet (25 mg total) by mouth daily. 08/12/13   Jacques Navy, MD  valproic acid (DEPAKENE) 250 MG capsule Take 1 capsule (250 mg total) by mouth 2 (two) times daily. 08/12/13   Jacques Navy, MD     Physical Exam: Filed Vitals:   05/21/14 1818 05/21/14 1830 05/21/14 1900 05/21/14 1930  BP: 147/91 152/97 165/102 145/71  Pulse: 64 84 102 86  Temp:      TempSrc:      Resp: SpO2: 98% 95% 91% 94%     General: Chronically ill-looking, thin built Vs Malnourished, temporal wasting, disheveled, poor grooming HEENT: Normocephalic and Atraumatic, Mucous membranes pink                PERRLA; EOM intact; No scleral icterus,                 Nares: Patent, Oropharynx: Clear, Fair Dentition                 Neck: FROM, no cervical lymphadenopathy, thyromegaly, carotid bruit or JVD;  CHEST: Normal respiration, clear to auscultation bilaterally, AICD in situ HEART: Regular rate and rhythm; no murmurs rubs or gallops.  BACK: No kyphosis or scoliosis; no CVA tenderness  ABDOMEN: Positive Bowel Sounds, soft, non-tender; no masses, no organomegaly. Adult diaper in situ Rectal Exam: deferred EXTREMITIES: No cyanosis, poor hygiene, multiple scars and scabs from recurrent falls Genitalia: not examined  SKIN:  Multiple scars and scabs on lower limbs attributed to recurrent falls CNS: Alert and Oriented x 2, not oriented to time, Non-focal exam, CN 2-12 intact  Labs on Admission:  Basic Metabolic Panel:  Recent Labs Lab  05/21/14 1718  NA 140  K 3.7  CL 104  CO2 23  GLUCOSE 94  BUN 17  CREATININE 0.85  CALCIUM 9.0   Liver Function Tests:  Recent Labs Lab 05/21/14 1718  AST 14  ALT <5  ALKPHOS 100  BILITOT 0.5  PROT 7.1  ALBUMIN 3.2*   No results found for this basename: LIPASE, AMYLASE,  in the last 168 hours No results found for this basename: AMMONIA,  in the last 168 hours CBC:  Recent Labs Lab 05/21/14 1718  WBC 6.6  HGB 13.6  HCT 40.6  MCV 96.2  PLT 194  Cardiac Enzymes: No results found for this basename: CKTOTAL, CKMB, CKMBINDEX, TROPONINI,  in the last 168 hours  BNP (last 3 results)  Recent Labs  08/05/13 1627 08/06/13 0310  PROBNP 16611.0* 11186.0*   CBG: No results found for this basename: GLUCAP,  in the last 168 hours  Radiological Exams on Admission: Dg Chest 2 View  05/21/2014   CLINICAL DATA:  Failure to thrive.  EXAM: CHEST  2 VIEW  COMPARISON:  Chest radiograph 08/11/2013  FINDINGS: Stable enlargement of the cardiopericardial silhouette. Stable mediastinal and hilar contours. Left subclavian AICD with leads terminating in the right atrium, right ventricle, and coronary sinus. Pulmonary vascularity is within normal limits. No airspace disease or pleural effusion. Lungs clear. Negative for pneumothorax.  There are two acute appearing right-sided rib fractures, involving the lateral right ninth and lateral tenth ribs.  IMPRESSION: Two acute appearing right rib fractures (the lateral ninth and tenth ribs).  Cardiomegaly with AICD. No acute cardiopulmonary disease identified.   Electronically Signed   By: Britta Mccreedy M.D.   On: 05/21/2014 18:22    EKG: Independently reviewed.  CXR IMPRESSION: 05/21/2014 Two acute appearing right rib fractures (the lateral ninth and tenth  ribs). Cardiomegaly with AICD. No acute cardiopulmonary disease identified.   Assessment/Plan Principal Problem:   Total self-care deficit Active Problems:   HYPERTENSION   CORONARY  ARTERY DISEASE   History of stroke   Biventricular ICD (implantable cardioverter-defibrillator) in place   Protein-calorie malnutrition, severe  Total care deficit: - There is a major concern the patient is unable to care for himself, and is unable to carry out his activities of daily living, we'll consult case manager and social worker for nursing home or long-term care placement. - Restart all home medications - Hygiene - Heart healthy diet - Fall precautions - Patient can be discharged when placement is found  Consulted: Case Production designer, theatre/television/film, Child psychotherapist  Code Status: Full Family Communication: No family at the bedside, patient could not provide phone # of brother in [place] DVT Prophylaxis: Lovenox  Time spent: 45  Javonnie Illescas, Keane Scrape, MD [facility]  If 7PM-7AM, please contact night-coverage [email or web address] 05/21/2014, 7:58 PM

## 2014-05-22 MED ORDER — CARVEDILOL 25 MG PO TABS: 25.0000 mg | ORAL_TABLET | Freq: Two times a day (BID) | ORAL | Status: DC

## 2014-05-22 MED ORDER — CETYLPYRIDINIUM CHLORIDE 0.05 % MT LIQD
7.0000 mL | Freq: Two times a day (BID) | OROMUCOSAL | Status: DC
  Administered 2014-05-22 – 2014-05-23 (×3): 7 mL via OROMUCOSAL

## 2014-05-22 MED ORDER — ENSURE COMPLETE PO LIQD
237.0000 mL | Freq: Two times a day (BID) | ORAL | Status: DC
  Administered 2014-05-22: 237 mL via ORAL

## 2014-05-22 MED ORDER — CARVEDILOL 12.5 MG PO TABS
12.5000 mg | ORAL_TABLET | Freq: Two times a day (BID) | ORAL | Status: DC
  Filled 2014-05-22 (×3): qty 1

## 2014-05-22 NOTE — Progress Notes (Signed)
OT Cancellation Note  Patient Details Name: ARVO RENTER MRN: [id number] DOB: 1945-11-28   Cancelled Treatment:    Reason Eval/Treat Not Completed: Other (comment).  Pt busy with another staff member.  Will check back later if schedule permits.  Brooklynn Brandenburg 05/22/2014, 3:03 PM Marica Otter, OTR/L [phone] 05/22/2014

## 2014-05-22 NOTE — Progress Notes (Signed)
Note/chart reviewed. Agree with note.   Nandana Krolikowski RD, LDN, CNSC [phone] Pager [phone] After Hours Pager   

## 2014-05-22 NOTE — Discharge Instructions (Signed)
Followup with cardiology, heart failure clinic, call to schedule appointment Patient needs to follow up with PCP in 5-7 days  Continue health maintenance/immunization/cancer screening appropriate for age after discharge

## 2014-05-22 NOTE — Evaluation (Signed)
Physical Therapy Evaluation Patient Details Name: [name] MRN: [id number] DOB: [birth year] Today's Date: 05/22/2014   History of Present Illness  [name] is a 68 y.o. male with a history of bipolar affective disorder, multiple sclerosis, hypertension, coronary artery disease and chronic systolic dysfunction with ejection fraction of 20% status post biventricular ICD placement, and recent CVA was brought to the ED today by EMS covered in his own feces and disheveled. when examined but found to have multiple scars and the wound scabs on the lower extremities, he reported that he has been falling very frequently at home. He is not able to carry out his activities of daily living including bathing, grooming, cooking, grossly shopping. He claims a neighbor (lady) helps him out occasionally.  Clinical Impression  Pt presenting with decreased insight to deficits, decreased safety awareness, generalized weakness, ataxia, increased falls risk and inability to ambulate or perform ADLs indep. Pt with freq falls history. Spoke with pt regarding recommendations for SNF upon d/c to achieve safe mod I function for safe transition home however pt adamantly refused, as well at HHPT. Pt unsafe to return home alone however pt adamant. Pt requires 24/7 assist for safe d/c home otherwise recommend SNF however pt non-compliant and desires to return home despite his known inability to care for self.     Follow Up Recommendations SNF;Supervision/Assistance - 24 hour    Equipment Recommendations  None recommended by PT (defer to next venue, pt refusing RW at this time)    Recommendations for Other Services       Precautions / Restrictions Precautions Precautions: Fall Restrictions Weight Bearing Restrictions: No      Mobility  Bed Mobility Overal bed mobility: Needs Assistance Bed Mobility: Supine to Sit     Supine to sit: Min assist     General bed mobility comments: pt initiated transfer but  required assist for trunk elevation  Transfers Overall transfer level: Needs assistance Equipment used: 1 person hand held assist Transfers: Sit to/from Stand Sit to Stand: Mod assist         General transfer comment: pt initated transfer but then reached for help to complete stand  Ambulation/Gait Ambulation/Gait assistance: Max assist Ambulation Distance (Feet): 30 Feet Assistive device: 1 person hand held assist Gait Pattern/deviations: Step-to pattern;Decreased stride length;Ataxic;Scissoring Gait velocity: slow Gait velocity interpretation: <1.8 ft/sec, indicative of risk for recurrent falls General Gait Details: pt adamantly refused to use AD. pt dependent on 1 person to amb. pt with narrow base of support and difficulty sequencing stepping pattern with heavy reliance on PT to maintain upright position.  Stairs            Wheelchair Mobility    Modified Rankin (Stroke Patients Only)       Balance Overall balance assessment: Needs assistance Sitting-balance support: Feet supported;No upper extremity supported Sitting balance-Leahy Scale: Fair     Standing balance support: Single extremity supported Standing balance-Leahy Scale: Poor Standing balance comment: pt unable to maintain standing balance without UE assist                             Pertinent Vitals/Pain Pain Assessment: No/denies pain    Home Living Family/patient expects to be discharged to:: Skilled nursing facility (however pt refuses and wants to return home) Living Arrangements: Alone               Additional Comments: pt reports 1 story home with 3  steps to enter with handrail. Pt reports difficulty with walking and completing ADLs. Pt does report a male does assist him but unclear on how much. Pt does not drive.    Prior Function           Comments: pt living at home alone however has freq falls and unalbe to perform ADLs.     Hand Dominance   Dominant Hand:  Right    Extremity/Trunk Assessment   Upper Extremity Assessment: Generalized weakness;Difficult to assess due to impaired cognition           Lower Extremity Assessment: Generalized weakness;Difficult to assess due to impaired cognition      Cervical / Trunk Assessment: Kyphotic  Communication   Communication: Expressive difficulties  Cognition Arousal/Alertness: Awake/alert Behavior During Therapy: Flat affect Overall Cognitive Status: Impaired/Different from baseline Area of Impairment: Safety/judgement;Awareness;Problem solving         Safety/Judgement: Decreased awareness of safety;Decreased awareness of deficits Awareness: Intellectual Problem Solving: Slow processing;Difficulty sequencing;Requires verbal cues;Requires tactile cues General Comments: pt adamant about returning home despite being able to care for self    General Comments      Exercises        Assessment/Plan    PT Assessment Patient needs continued PT services  PT Diagnosis Difficulty walking;Generalized weakness   PT Problem List Decreased strength;Decreased activity tolerance;Decreased balance;Decreased mobility;Decreased cognition;Decreased knowledge of use of DME;Decreased safety awareness  PT Treatment Interventions DME instruction;Gait training;Stair training;Functional mobility training;Therapeutic activities;Therapeutic exercise;Balance training;Neuromuscular re-education   PT Goals (Current goals can be found in the Care Plan section) Acute Rehab PT Goals PT Goal Formulation: Patient unable to participate in goal setting Time For Goal Achievement: 06/05/14 Potential to Achieve Goals: Fair    Frequency Min 3X/week   Barriers to discharge Decreased caregiver support lives alone, can not take care of self    Co-evaluation               End of Session Equipment Utilized During Treatment: Gait belt Activity Tolerance: Patient limited by fatigue Patient left: in chair;with  chair alarm set;with call bell/phone within reach Nurse Communication: Mobility status         Time: 1555-1616 PT Time Calculation (min): 21 min   Charges:   PT Evaluation $Initial PT Evaluation Tier I: 1 Procedure PT Treatments $Gait Training: 8-22 mins   PT G CodesMarcene Brawn 05/22/2014, 4:51 PM  [name] Shock, PT, DPT Pager #: [phone] Office #: [phone]

## 2014-05-22 NOTE — Discharge Summary (Signed)
Physician Discharge Summary  [name] MRN: [id number] DOB/AGE: 1946/08/25 68 y.o.  PCP: Stacy Gardner, PA-C   Admit date: 05/21/2014 Discharge date: 05/22/2014  Discharge Diagnoses:  bipolar affective disorder   Total self-care deficit Noncompliance with medical recommendations High risk for readmission   HYPERTENSION   CORONARY ARTERY DISEASE   History of stroke   Biventricular ICD (implantable cardioverter-defibrillator) in place   Protein-calorie malnutrition, severe  Follow up recommendations Adult Protective Services have been notified to investigate patient's living situation Patient needs to follow up with PCP in 5-7 days Continue health maintenance/immunization/cancer screening appropriate for age after discharge     Medication List         acetaminophen 325 MG tablet  Commonly known as:  TYLENOL  Take 2 tablets (650 mg total) by mouth every 6 (six) hours as needed for mild pain (or Fever >/= 101).     aspirin 81 MG EC tablet  Take 1 tablet (81 mg total) by mouth daily.     atorvastatin 40 MG tablet  Commonly known as:  LIPITOR  Take 1 tablet (40 mg total) by mouth daily.     benazepril 40 MG tablet  Commonly known as:  LOTENSIN  Take 1 tablet (40 mg total) by mouth daily.     carvedilol 25 MG tablet  Commonly known as:  COREG  Take 1 tablet (25 mg total) by mouth 2 (two) times daily with a meal.     clopidogrel 75 MG tablet  Commonly known as:  PLAVIX  Take 1 tablet (75 mg total) by mouth daily.     famotidine 20 MG tablet  Commonly known as:  PEPCID  Take 1 tablet (20 mg total) by mouth daily.     feeding supplement (ENSURE) Pudg  Take 1 Container by mouth 2 (two) times daily between meals.     FLUoxetine 20 MG capsule  Commonly known as:  PROZAC  Take 1 capsule (20 mg total) by mouth daily.     furosemide 20 MG tablet  Commonly known as:  LASIX  Take 1 tablet (20 mg total) by mouth daily.     ondansetron 4 MG tablet  Commonly  known as:  ZOFRAN  Take 1 tablet (4 mg total) by mouth every 6 (six) hours as needed for nausea.     spironolactone 25 MG tablet  Commonly known as:  ALDACTONE  Take 1 tablet (25 mg total) by mouth daily.     valproic acid 250 MG capsule  Commonly known as:  DEPAKENE  Take 1 capsule (250 mg total) by mouth 2 (two) times daily.        Discharge Condition: Guarded  Disposition: Patient wants to go home, refusing SNF   Consults: None  Significant Diagnostic Studies: Dg Chest 2 View  05/21/2014   CLINICAL DATA:  Failure to thrive.  EXAM: CHEST  2 VIEW  COMPARISON:  Chest radiograph 08/11/2013  FINDINGS: Stable enlargement of the cardiopericardial silhouette. Stable mediastinal and hilar contours. Left subclavian AICD with leads terminating in the right atrium, right ventricle, and coronary sinus. Pulmonary vascularity is within normal limits. No airspace disease or pleural effusion. Lungs clear. Negative for pneumothorax.  There are two acute appearing right-sided rib fractures, involving the lateral right ninth and lateral tenth ribs.  IMPRESSION: Two acute appearing right rib fractures (the lateral ninth and tenth ribs).  Cardiomegaly with AICD. No acute cardiopulmonary disease identified.   Electronically Signed   By: Curlene Dolphin  M.D.   On: 05/21/2014 18:22       Microbiology: No results found for this or any previous visit (from the past 240 hour(s)).   Labs: Results for orders placed during the hospital encounter of 05/21/14 (from the past 48 hour(s))  CBC     Status: None   Collection Time    05/21/14  5:18 PM      Result Value Ref Range   WBC 6.6  4.0 - 10.5 K/uL   RBC 4.22  4.22 - 5.81 MIL/uL   Hemoglobin 13.6  13.0 - 17.0 g/dL   HCT 40.6  39.0 - 52.0 %   MCV 96.2  78.0 - 100.0 fL   MCH 32.2  26.0 - 34.0 pg   MCHC 33.5  30.0 - 36.0 g/dL   RDW 13.0  11.5 - 15.5 %   Platelets 194  150 - 400 K/uL  COMPREHENSIVE METABOLIC PANEL     Status: Abnormal   Collection Time     05/21/14  5:18 PM      Result Value Ref Range   Sodium 140  137 - 147 mEq/L   Potassium 3.7  3.7 - 5.3 mEq/L   Chloride 104  96 - 112 mEq/L   CO2 23  19 - 32 mEq/L   Glucose, Bld 94  70 - 99 mg/dL   BUN 17  6 - 23 mg/dL   Creatinine, Ser 0.85  0.50 - 1.35 mg/dL   Calcium 9.0  8.4 - 10.5 mg/dL   Total Protein 7.1  6.0 - 8.3 g/dL   Albumin 3.2 (*) 3.5 - 5.2 g/dL   AST 14  0 - 37 U/L   ALT <5  0 - 53 U/L   Alkaline Phosphatase 100  39 - 117 U/L   Total Bilirubin 0.5  0.3 - 1.2 mg/dL   GFR calc non Af Amer 89 (*) >90 mL/min   GFR calc Af Amer >90  >90 mL/min   Comment: (NOTE)     The eGFR has been calculated using the CKD EPI equation.     This calculation has not been validated in all clinical situations.     eGFR's persistently <90 mL/min signify possible Chronic Kidney     Disease.   Anion gap 13  5 - 15  I-STAT CG4 LACTIC ACID, ED     Status: None   Collection Time    05/21/14  5:28 PM      Result Value Ref Range   Lactic Acid, Venous 1.34  0.5 - 2.2 mmol/L  URINALYSIS, ROUTINE W REFLEX MICROSCOPIC     Status: Abnormal   Collection Time    05/21/14  6:44 PM      Result Value Ref Range   Color, Urine YELLOW  YELLOW   APPearance CLEAR  CLEAR   Specific Gravity, Urine 1.025  1.005 - 1.030   pH 6.0  5.0 - 8.0   Glucose, UA NEGATIVE  NEGATIVE mg/dL   Hgb urine dipstick NEGATIVE  NEGATIVE   Bilirubin Urine SMALL (*) NEGATIVE   Ketones, ur 15 (*) NEGATIVE mg/dL   Protein, ur NEGATIVE  NEGATIVE mg/dL   Urobilinogen, UA 1.0  0.0 - 1.0 mg/dL   Nitrite NEGATIVE  NEGATIVE   Leukocytes, UA NEGATIVE  NEGATIVE   Comment: MICROSCOPIC NOT DONE ON URINES WITH NEGATIVE PROTEIN, BLOOD, LEUKOCYTES, NITRITE, OR GLUCOSE <1000 mg/dL.     HPI :[name] is a 68 y.o. male with a history of bipolar affective  disorder, multiple sclerosis, hypertension, coronary artery disease and chronic systolic dysfunction with ejection fraction of 20% status post biventricular ICD placement, and  recent CVA was brought to the ED today by EMS covered in his own feces and disheveled. Someone had called EMS the patient is not able to care for himself, patient does not know who called and why, but he agrees that he is not able to take care of himself. He has no complaint. But when examined but found to have multiple scars and the wound scabs on the lower extremities, he reported that he has been falling very frequently at home. He is not able to carry out his activities of daily living including bathing, grooming, cooking, grossly shopping. He claims a neighbor (lady) helps him out occasionally. He has no immediate family in [place] according to patient, a brother lives in [place]. He has personal history of alcohol abuse but he denies current use, he now smokes cigarettes occasionally after 40-pack-year of heavy smoking. He has been losing weight because of poor food intake. Since after his stroke, patient has had difficulty with his speech. He denies headache, no chest pain, no cough, no shortness of breath, no neck swelling.   HOSPITAL COURSE:  Total self-care deficit -refusing placement Adult protective services activated because of poor living situation at home Patient can go home today as no active medical issues Patient does have capacity to make his decision  Rib Fracture patient denies any pain, no difficulty breathing, has an AICD in place  Chronic systolic heart failure-currently on Lasix, Coreg, Lotensin, Aldactone  recommended that the patient be compliant with his medications, patient appears to respond appropriately and understand recommendations Recommend routine followup at the heart failure clinic in 1-2 weeks   HYPERTENSION -increased Coreg, continue Lotensin, needs BMP in one week  CORONARY ARTERY DISEASE   History of stroke patient to continue taking aspirin   Protein-calorie malnutrition, severe     Discharge Exam:   Blood pressure 120/81, pulse 69,  temperature 97.7 F (36.5 C), temperature source Axillary, resp. rate 20, height _0  (1.753 m), weight 57.289 kg (126 lb 4.8 oz), SpO2 93.00%.  General: Chronically ill-looking, thin built Vs Malnourished, temporal wasting, disheveled, poor grooming  HEENT: Normocephalic and Atraumatic, Mucous membranes pink  PERRLA; EOM intact; No scleral icterus,  Nares: Patent, Oropharynx: Clear, Fair Dentition  Neck: FROM, no cervical lymphadenopathy, thyromegaly, carotid bruit or JVD;  CHEST: Normal respiration, clear to auscultation bilaterally, AICD in situ  HEART: Regular rate and rhythm; no murmurs rubs or gallops.  BACK: No kyphosis or scoliosis; no CVA tenderness  ABDOMEN: Positive Bowel Sounds, soft, non-tender; no masses, no organomegaly. Adult diaper in situ  Rectal Exam: deferred  EXTREMITIES: No cyanosis, poor hygiene, multiple scars and scabs from recurrent falls  Genitalia: not examined  SKIN: Multiple scars and scabs on lower limbs attributed to recurrent falls  CNS: Alert and Oriented x 2, not oriented to time, Non-focal exam, CN 2-12 intact        Discharge Instructions   Diet - low sodium heart healthy    Complete by:  As directed      Increase activity slowly    Complete by:  As directed            Follow-up Information   Follow up with Stacy Gardner, PA-C. Schedule an appointment as soon as possible for a visit in 1 week.   Specialty:  Physician Assistant   Contact information:   [phone]  [place] [address] [phone]       Signed: Reyne Dumas 05/22/2014, 1:40 PM

## 2014-05-22 NOTE — Progress Notes (Signed)
INITIAL NUTRITION ASSESSMENT  DOCUMENTATION CODES Per approved criteria  -Severe malnutrition in the context of chronic illness  Pt meets criteria for severe MALNUTRITION in the context of chronic illness as evidenced by severe muscle and subcutaneous fat depletion.  INTERVENTION:  Provide Ensure Complete po TID, each supplement provides 350 kcal and 13 grams of protein  Encourage PO intake  Will continue to monitor  NUTRITION DIAGNOSIS:  Inadequate oral intake related to inability to manage self care as evidenced by inadequate food consumption per diet history.   Goal: Pt to meet >/= 90% of their estimated nutrition needs   Monitor:  PO and supplemental intake, weight, labs, I/O's  Reason for Assessment: Pt identified as at nutrition risk on the Malnutrition Screen Tool   Admitting Dx: Total self-care deficit  ASSESSMENT: 68 y.o. male with a history of bipolar affective disorder, multiple sclerosis, hypertension, coronary artery disease and chronic systolic dysfunction with ejection fraction of 20% status post biventricular ICD placement, and recent CVA.He has personal history of alcohol abuse but he denies current use, he now smokes cigarettes occasionally after 40-pack-year of heavy smoking. He has been losing weight because of poor food intake.   Pt reports wt loss of 50 lbs x 6 months, states that he was his UBW of 175 lb 6 mos ago. However, weight history in documentation shows that he was last 170 lb in 2007. Since 2007, his weight has steadily decreased. PMHx of ETOH abuse, not currently using. Pt is also missing some teeth.  When asked about pt's diet PTA, he states that he would eat fast food once or twice a day, meals that consisted of a hamburger and soda.   Currently his appetite is adequate, reported PO intake "120%".  Pt would like Ensure with meals, will send TID.  Nutrition Focused Physical Exam:  Subcutaneous Fat:  Orbital Region: WNL Upper Arm Region:  severe depletion Thoracic and Lumbar Region: NA  Muscle:  Temple Region: severe depletion Clavicle Bone Region: severe depletion Clavicle and Acromion Bone Region: severe depletion Scapular Bone Region: severe depletion Dorsal Hand: severe depletion Patellar Region: moderate depletion Anterior Thigh Region: moderate depletion Posterior Calf Region: moderate depletion  Edema: no LE edema   Labs reviewed.  Height: Ht Readings from Last 1 Encounters:  05/21/14 5\' 9"  (1.753 m)    Weight: Wt Readings from Last 1 Encounters:  05/21/14 126 lb 4.8 oz (57.289 kg)    Ideal Body Weight: 160 lb  % Ideal Body Weight: 79%  Wt Readings from Last 10 Encounters:  05/21/14 126 lb 4.8 oz (57.289 kg)  08/12/13 119 lb 8 oz (54.205 kg)  08/12/13 119 lb 8 oz (54.205 kg)  06/30/12 135 lb (61.236 kg)  11/05/11 128 lb (58.06 kg)  08/04/11 141 lb (63.957 kg)  04/23/11 140 lb (63.504 kg)  05/08/06 170 lb (77.111 kg)    Usual Body Weight: 175 lb per pt  % Usual Body Weight: 72%  BMI:  Body mass index is 18.64 kg/(m^2).  Estimated Nutritional Needs: Kcal: 1800-2000 Protein: 85-95g Fluid: 1.8L/day  Skin: scratch marks on legs  Diet Order: Cardiac  EDUCATION NEEDS: -Education not appropriate at this time   Intake/Output Summary (Last 24 hours) at 05/22/14 1017 Last data filed at 05/22/14 1000  Gross per 24 hour  Intake    100 ml  Output    300 ml  Net   -200 ml    Last BM: 9/13  Labs:   Recent Labs Lab 05/21/14 1718  NA 140  K 3.7  CL 104  CO2 23  BUN 17  CREATININE 0.85  CALCIUM 9.0  GLUCOSE 94    CBG (last 3)  No results found for this basename: GLUCAP,  in the last 72 hours  Scheduled Meds: . antiseptic oral rinse  7 mL Mouth Rinse BID  . aspirin EC  81 mg Oral Daily  . atorvastatin  40 mg Oral Daily  . benazepril  40 mg Oral Daily  . carvedilol  25 mg Oral BID WC  . clopidogrel  75 mg Oral Daily  . enoxaparin (LOVENOX) injection  40 mg  Subcutaneous Q24H  . famotidine  20 mg Oral Daily  . FLUoxetine  20 mg Oral Daily  . furosemide  20 mg Oral Daily  . spironolactone  25 mg Oral Daily  . valproic acid  250 mg Oral BID    Continuous Infusions:   Past Medical History  Diagnosis Date  . Coronary artery disease     s/p PCI of LAD with BMS 04/03/06 at [facility]  . Hyperlipidemia   . Hypertension   . Cerebrovascular accident     history of cerebrovascular accident with residual left hemiparesis in 2002, seen at [facility]  . Multiple sclerosis   . Ischemic cardiomyopathy     s/p BiV ICD implanted 2007 at [facility]  . Chronic systolic congestive heart failure   . Bipolar disorder     Past Surgical History  Procedure Laterality Date  . Tonsillectomy    . Vasectomy    . Implant of automatic cardioverter defibrillator      December 26, 2004 MDT BiV ICD implanted by Dr Chales Abrahams in [facility]  . Ptca      PCI LAD with BMS 04/03/06    Tilda Franco, MS, PLDN Provisionally Licensed Dietitian Nutritionist Pager: [phone]

## 2014-05-22 NOTE — Care Management Note (Signed)
  Page 1 of 1   05/22/2014     12:04:46 PM CARE MANAGEMENT NOTE 05/22/2014  Patient:  [name], [name]   Account Number:  [id number]  Date Initiated:  05/22/2014  Documentation initiated by:  Ronny Flurry  Subjective/Objective Assessment:     Action/Plan:   Anticipated DC Date:     Anticipated DC Plan:  HOME/SELF CARE         Choice offered to / List presented to:             Status of service:   Medicare Important Message given?  YES (If response is "NO", the following Medicare IM given date fields will be blank) Date Medicare IM given:  05/22/2014 Medicare IM given by:  Ronny Flurry Date Additional Medicare IM given:   Additional Medicare IM given by:    Discharge Disposition:    Per UR Regulation:    If discussed at Long Length of Stay Meetings, dates discussed:    Comments:  05-22-14 Doris SW spoke to patient and support person at bedside , patient now refusing SNF .   NCM went and spoke with patient and Ms Manson Passey . Patient does NOT want SNF . Ms Manson Passey states she lives with patient and provides care. Ms Brown's son fell from tree stand and "broke his back" , so she was helping her son . Now her son is home and he has support from his girl friend . Ms Manson Passey is now available full time to assit patient .  [facility] refusing home health also. DR Susie Cassette aware . SW calling APS. Ronny Flurry RN BSN   05-22-14 Referral for home health vs LTAC .   Patient not Cand[facility] for LTAC . He is interested in SNF . Left message for Elroy SW. Ronny Flurry RN BSN [phone]

## 2014-05-22 NOTE — Progress Notes (Signed)
OT Cancellation Note  Patient Details Name: [name] MRN: [id number] DOB: [birth year]   Cancelled Treatment:    Reason Eval/Treat Not Completed: Other (comment).  Nursing is cleaning pt up; will check back tomorrow.  Caryn Gienger 05/22/2014, 3:54 PM Marica Otter, OTR/L [phone] 05/22/2014

## 2014-05-23 MED ORDER — CARVEDILOL 6.25 MG PO TABS: 6.2500 mg | ORAL_TABLET | Freq: Two times a day (BID) | ORAL | Status: DC

## 2014-05-23 MED ORDER — CARVEDILOL 6.25 MG PO TABS
6.2500 mg | ORAL_TABLET | Freq: Two times a day (BID) | ORAL | Status: DC
  Administered 2014-05-23: 6.25 mg via ORAL
  Filled 2014-05-23: qty 1

## 2014-05-23 NOTE — Progress Notes (Signed)
Pt. Discharge to [facility] via [facility].  Information packet given to [facility] staff.  Gave report to [place], Charity fundraiser of [employer] [place].

## 2014-05-23 NOTE — Evaluation (Signed)
Occupational Therapy Evaluation Patient Details Name: [name] MRN: [id number] DOB: 1945-10-29 Today's Date: 05/23/2014    History of Present Illness [name] is a 68 y.o. male with a history of bipolar affective disorder, multiple sclerosis, hypertension, coronary artery disease and chronic systolic dysfunction with ejection fraction of 20% status post biventricular ICD placement, and recent CVA was brought to the ED today by EMS covered in his own feces and disheveled. when examined but found to have multiple scars and the wound scabs on the lower extremities, he reported that he has been falling very frequently at home. He is not able to carry out his activities of daily living including bathing, grooming, cooking, grossly shopping. He claims a neighbor (lady) helps him out occasionally.   Clinical Impression   Pt presents with increased weakness overall as well as impaired balance and ability to perform functional selfcare tasks.  He currently needs min to mod assist for functional transfers as well as mod assist for LB selfcare.  Decreased awareness of his current condition and then need to have 24 hour assist.  Feel he will benefit from acute care OT to help increase overall independence with basic selfcare tasks, however feel pt will need SNF for follow-up therapy as he does not have 24 hour supervision/assistance.     Follow Up Recommendations  SNF;Supervision/Assistance - 24 hour    Equipment Recommendations  None recommended by OT       Precautions / Restrictions Precautions Precautions: Fall Restrictions Weight Bearing Restrictions: No      Mobility Bed Mobility Overal bed mobility: Needs Assistance Bed Mobility: Sit to Supine     Supine to sit: Min assist Sit to supine: Min assist      Transfers Overall transfer level: Needs assistance   Transfers: Sit to/from Stand Sit to Stand: Mod assist              Balance Overall balance assessment: Needs  assistance   Sitting balance-Leahy Scale: Fair       Standing balance-Leahy Scale: Poor                              ADL Overall ADL's : Needs assistance/impaired Eating/Feeding: Supervision/ safety;Sitting   Grooming: Wash/dry hands;Wash/dry face;Sitting;Supervision/safety   Upper Body Bathing: Supervision/ safety;Sitting   Lower Body Bathing: Moderate assistance;Sit to/from stand   Upper Body Dressing : Minimal assistance;Sitting   Lower Body Dressing: Maximal assistance;Sit to/from stand   Toilet Transfer: Moderate assistance;Stand-pivot;BSC   Toileting- Clothing Manipulation and Hygiene: Moderate assistance;Sit to/from stand       Functional mobility during ADLs: Moderate assistance;Minimal assistance General ADL Comments: Pt with increased moans throughout session when asked to perform tasks.  When questioned pt reported not being in any pain however.  Decreased ability to integrate the LUE into functional tasks such as stabilizing the applesauce for opening.  Pt with decreased initiation of the LUE as well when he was attempting to eat the applesauce with the RUE he would not use the left to keep it from sliding around on his tray table.   Decreased ability to reach his LEs for donning socks or performing LB selfcare.      Vision                 Additional Comments: Grossly tested visual acuity which was [facility] for far vision.  Will continue to assess in function as initially he had difficulty locating  object on the left side of the tray.   Perception Perception Perception Tested?: No   Praxis Praxis Praxis tested?: Within functional limits    Pertinent Vitals/Pain Pain Assessment: No/denies pain     Hand Dominance Right   Extremity/Trunk Assessment Upper Extremity Assessment Upper Extremity Assessment: LUE deficits/detail LUE Deficits / Details: Pt with slight hemiparesis of the left hand and arm.  Demonstrates decreased initiation and  attempted use of the UE for any functional activities.  Full gross grasp and release present but decreased ability to manipulate items when attempting to hold something for opening it.    Lower Extremity Assessment Lower Extremity Assessment: Defer to PT evaluation   Cervical / Trunk Assessment Cervical / Trunk Assessment: Kyphotic   Communication Communication Communication: Expressive difficulties   Cognition Arousal/Alertness: Awake/alert   Overall Cognitive Status: Impaired/Different from baseline Area of Impairment: Safety/judgement;Awareness;Problem solving         Safety/Judgement: Decreased awareness of safety;Decreased awareness of deficits Awareness: Intellectual Problem Solving: Slow processing;Difficulty sequencing;Requires verbal cues;Requires tactile cues General Comments: Pt increasingly agitated at times.  He stated it was because he "hates being this way". Demonstrates decreased awareness of his deficits and the need for assistance as well.               Home Living Family/patient expects to be discharged to:: Skilled nursing facility (however pt refuses and wants to return home) Living Arrangements: Alone                               Additional Comments: pt reports 1 story home with 3 steps to enter with handrail. Pt reports difficulty with walking and completing ADLs. Pt does report a male does assist him but unclear on how much. Pt does not drive.      Prior Functioning/Environment          Comments: pt living at home alone however has freq falls and unalbe to perform ADLs.    OT Diagnosis: Generalized weakness;Cognitive deficits;Hemiplegia non-dominant side   OT Problem List: Decreased strength;Impaired balance (sitting and/or standing);Decreased safety awareness;Decreased cognition;Decreased coordination;Decreased knowledge of use of DME or AE;Impaired UE functional use   OT Treatment/Interventions: Self-care/ADL  training;Therapeutic exercise;Neuromuscular education;Therapeutic activities;DME and/or AE instruction;Cognitive remediation/compensation;Balance training;Patient/family education    OT Goals(Current goals can be found in the care plan section) Acute Rehab OT Goals Patient Stated Goal: Pt wants to get better OT Goal Formulation: With patient Time For Goal Achievement: 06/06/14 Potential to Achieve Goals: Good  OT Frequency: Min 2X/week   Barriers to D/C: Inaccessible home environment          Co-evaluation PT/OT/SLP Co-Evaluation/Treatment: Yes Reason for Co-Treatment: For patient/therapist safety          End of Session Nurse Communication: Mobility status  Activity Tolerance: Patient limited by fatigue Patient left: in bed;with nursing/sitter in room   Time: 1420-1453 OT Time Calculation (min): 33 min Charges:  OT General Charges $OT Visit: 1 Procedure OT Evaluation $Initial OT Evaluation Tier I: 1 Procedure OT Treatments $Self Care/Home Management : 8-22 mins Anthonyjames Bargar OTR/L 05/23/2014, 3:08 PM

## 2014-05-23 NOTE — Clinical Social Work Placement (Addendum)
Clinical Social Work Department CLINICAL SOCIAL WORK PLACEMENT NOTE 05/23/2014  Patient:  [name], [name]  Account Number:  [id number] Admit date:  05/21/2014  Clinical Social Worker:  Merlyn Lot, CLINICAL SOCIAL WORKER  Date/time:  05/23/2014 02:58 PM  Clinical Social Work is seeking post-discharge placement for this patient at the following level of care:   SKILLED NURSING   (*CSW will update this form in Epic as items are completed)   05/22/2014  Patient/family provided with [facility] Department of Clinical Social Work's list of facilities offering this level of care within the geographic area requested by the patient (or if unable, by the patient's family).  05/22/2014  Patient/family informed of their freedom to choose among providers that offer the needed level of care, that participate in Medicare, Medicaid or managed care program needed by the patient, have an available bed and are willing to accept the patient.  05/22/2014  Patient/family informed of [facility]' ownership interest in [facility], as well as of the fact that they are under no obligation to receive care at this facility.  PASARR submitted to EDS on 05/22/2014 PASARR number received on 05/22/2014  FL2 transmitted to all facilities in geographic area requested by pt/family on  05/22/2014 FL2 transmitted to all facilities within larger geographic area on   Patient informed that his/her managed care company has contracts with or will negotiate with  certain facilities, including the following:     Patient/family informed of bed offers received:  05/23/2014 Patient chooses bed at [facility] [place] Physician recommends and patient chooses bed at    Patient to be transferred to [facility] [place] on  05/23/2014 Patient to be transferred to facility by ptar Patient and family notified of transfer on 05/23/2014 Name of family member notified:  Caretaker was at bedside  during interview and is aware of discharge.  Patient requested that his son, Casimiro Needle, who is on the chart not be notified.  The following physician request were entered in Epic:   Additional Comments:   CSW signing off.  Merlyn Lot, LCSWA Clinical Social Worker [phone]

## 2014-05-23 NOTE — Evaluation (Signed)
Clinical/Bedside Swallow Evaluation Patient Details  Name: [name] MRN: [id number] Date of Birth: [birth year]  Today's Date: 05/23/2014 Time: 7829-5621 SLP Time Calculation (min): 33 min  Past Medical History:  Past Medical History  Diagnosis Date  . Coronary artery disease     s/p PCI of LAD with BMS 04/03/06 at [facility]  . Hyperlipidemia   . Hypertension   . Cerebrovascular accident     history of cerebrovascular accident with residual left hemiparesis in 2002, seen at [facility]  . Multiple sclerosis   . Ischemic cardiomyopathy     s/p BiV ICD implanted 2007 at [facility]  . Chronic systolic congestive heart failure   . Bipolar disorder    Past Surgical History:  Past Surgical History  Procedure Laterality Date  . Tonsillectomy    . Vasectomy    . Implant of automatic cardioverter defibrillator      December 26, 2004 MDT BiV ICD implanted by Dr Chales Abrahams in [facility]  . Ptca      PCI LAD with BMS 04/03/06   HPI:  [name] is a 68 y.o. male with a history of bipolar affective disorder, multiple sclerosis, hypertension, coronary artery disease and chronic systolic dysfunction with ejection fraction of 20% status post biventricular ICD placement, and recent CVA was brought to the ED today by EMS covered in his own feces and disheveled. when examined but found to have multiple scars and the wound scabs on the lower extremities, he reported that he has been falling very frequently at home. He is not able to carry out his activities of daily living including bathing, grooming, cooking, grossly shopping. He claims a neighbor (lady) helps him out occasionally.    Assessment / Plan / Recommendation Clinical Impression  Patient demonstrates left sided sensorimotor oral deficits characterized by poor mastication, awareness of bolus, pocketing and oral residue with solid textures.  Patient with wet vocal quality at baseline and upon administration of thin  liquids via cup patient also demonstrated multiple swallows with consistent wet vocal quality.  Given that this patient has been on thin liquids and current chest x-ray does not reveal any concerns recommend to continue with thin liquids, downgrade solids to Dys 3 soft solids and provide patient will full supervision to ensure upright posture, small portions, slow pace and use of intermittent throat clears and multiple swallows.  Patient would benefit from acute SLP follow up as well as follow up at the next level of care. SLP educated patient on use of safe swallow compensatory strategies throughout trials with Max cues to carryover and discussed recommendations with RN.      Aspiration Risk  Moderate    Diet Recommendation Dysphagia 3 (Mechanical Soft);Thin liquid   Liquid Administration via: Cup;No straw Medication Administration: Whole meds with puree Supervision: Full supervision/cueing for compensatory strategies;Patient able to self feed Compensations: Slow rate;Small sips/bites;Check for pocketing;Multiple dry swallows after each bite/sip;Clear throat intermittently Postural Changes and/or Swallow Maneuvers: Out of bed for meals;Seated upright 90 degrees    Other  Recommendations Oral Care Recommendations: Oral care BID   Follow Up Recommendations  24 hour supervision/assistance;Skilled Nursing facility    Frequency and Duration min 3x week  1 week   Pertinent Vitals/Pain none    SLP Swallow Goals See care plan for details   Swallow Study Prior Functional Status  WFL per patient; however, patient demonstrates poor awareness of deficits    General Date of Onset: 05/23/14 HPI: Kenneth  [name] is a 68 y.o. male with a history of bipolar affective disorder, multiple sclerosis, hypertension, coronary artery disease and chronic systolic dysfunction with ejection fraction of 20% status post biventricular ICD placement, and recent CVA was brought to the ED today by EMS covered in his  own feces and disheveled. when examined but found to have multiple scars and the wound scabs on the lower extremities, he reported that he has been falling very frequently at home. He is not able to carry out his activities of daily living including bathing, grooming, cooking, grossly shopping. He claims a neighbor (lady) helps him out occasionally.  Type of Study: Bedside swallow evaluation Previous Swallow Assessment: MBS 08/08/13 Diet Prior to this Study: Regular;Thin liquids Temperature Spikes Noted: No Respiratory Status: Room air History of Recent Intubation: No Behavior/Cognition: Alert;Distractible;Requires cueing;Impulsive Oral Cavity - Dentition: Poor condition;Missing dentition Self-Feeding Abilities: Able to feed self;Needs assist;Needs set up Patient Positioning: Other (comment) (up right edge of bed) Baseline Vocal Quality: Wet Volitional Cough: Strong Volitional Swallow: Able to elicit    Oral/Motor/Sensory Function Overall Oral Motor/Sensory Function: Impaired (difficult to follow directions but left sided deficits present due to previous CVA )   Ice Chips Ice chips: Not tested   Thin Liquid Thin Liquid: Impaired Presentation: Cup;Self Fed Pharyngeal  Phase Impairments: Multiple swallows;Wet Vocal Quality Other Comments: wet vocal quality at baseline and after trilas cues for small sips and multiple swallows reduced     Nectar Thick Nectar Thick Liquid: Not tested   Honey Thick Honey Thick Liquid: Not tested   Puree Puree: Within functional limits Presentation: Self Fed;Spoon   Solid   GO    Solid: Impaired Presentation: Self Fed Oral Phase Impairments: Poor awareness of bolus;Reduced lingual movement/coordination;Impaired mastication Oral Phase Functional Implications: Right anterior spillage;Left lateral sulci pocketing Pharyngeal Phase Impairments: Multiple swallows      Charlane Ferretti., CCC-SLP [phone]  Cameren Odwyer 05/23/2014,4:17 PM

## 2014-05-23 NOTE — Clinical Social Work Note (Signed)
Patient will discharge to [facility]  Anticipated discharge date: 05/23/14 Family notified: Patient requested not to inform son.  Caretaker at bedside and aware of discharge. Transportation by [employer]- called at 3:30    Merlyn Lot, [facility] Clinical Social Worker [phone]

## 2014-05-23 NOTE — Discharge Summary (Signed)
Physician Discharge Summary  [name] MRN: [id number] DOB/AGE: [birth year] 67 y.o.  PCP: Stacy Gardner, PA-C   Admit date: 05/21/2014 Discharge date: 05/23/2014  Discharge Diagnoses:  bipolar affective disorder   Total self-care deficit Noncompliance with medical recommendations High risk for readmission   HYPERTENSION   CORONARY ARTERY DISEASE   History of stroke   Biventricular ICD (implantable cardioverter-defibrillator) in place   Protein-calorie malnutrition, severe  Follow up recommendations Adult Protective Services have been notified to investigate patient's living situation Patient needs to follow up with PCP in 5-7 days  Continue health maintenance/immunization/cancer screening appropriate for age after discharge     Medication List         acetaminophen 325 MG tablet  Commonly known as:  TYLENOL  Take 2 tablets (650 mg total) by mouth every 6 (six) hours as needed for mild pain (or Fever >/= 101).     aspirin 81 MG EC tablet  Take 1 tablet (81 mg total) by mouth daily.     atorvastatin 40 MG tablet  Commonly known as:  LIPITOR  Take 1 tablet (40 mg total) by mouth daily.     benazepril 40 MG tablet  Commonly known as:  LOTENSIN  Take 1 tablet (40 mg total) by mouth daily.     carvedilol 6.25 MG tablet  Commonly known as:  COREG  Take 1 tablet (6.25 mg total) by mouth 2 (two) times daily with a meal.     clopidogrel 75 MG tablet  Commonly known as:  PLAVIX  Take 1 tablet (75 mg total) by mouth daily.     famotidine 20 MG tablet  Commonly known as:  PEPCID  Take 1 tablet (20 mg total) by mouth daily.     feeding supplement (ENSURE) Pudg  Take 1 Container by mouth 2 (two) times daily between meals.     FLUoxetine 20 MG capsule  Commonly known as:  PROZAC  Take 1 capsule (20 mg total) by mouth daily.     furosemide 20 MG tablet  Commonly known as:  LASIX  Take 1 tablet (20 mg total) by mouth daily.     ondansetron 4 MG tablet   Commonly known as:  ZOFRAN  Take 1 tablet (4 mg total) by mouth every 6 (six) hours as needed for nausea.     spironolactone 25 MG tablet  Commonly known as:  ALDACTONE  Take 1 tablet (25 mg total) by mouth daily.     valproic acid 250 MG capsule  Commonly known as:  DEPAKENE  Take 1 capsule (250 mg total) by mouth 2 (two) times daily.        Discharge Condition: Guarded  Disposition: Patient going to SNF   Consults: None  Significant Diagnostic Studies: Dg Chest 2 View  05/21/2014   CLINICAL DATA:  Failure to thrive.  EXAM: CHEST  2 VIEW  COMPARISON:  Chest radiograph 08/11/2013  FINDINGS: Stable enlargement of the cardiopericardial silhouette. Stable mediastinal and hilar contours. Left subclavian AICD with leads terminating in the right atrium, right ventricle, and coronary sinus. Pulmonary vascularity is within normal limits. No airspace disease or pleural effusion. Lungs clear. Negative for pneumothorax.  There are two acute appearing right-sided rib fractures, involving the lateral right ninth and lateral tenth ribs.  IMPRESSION: Two acute appearing right rib fractures (the lateral ninth and tenth ribs).  Cardiomegaly with AICD. No acute cardiopulmonary disease identified.   Electronically Signed   By: Audelia Acton.D.  On: 05/21/2014 18:22       Microbiology: No results found for this or any previous visit (from the past 240 hour(s)).   Labs: Results for orders placed during the hospital encounter of 05/21/14 (from the past 48 hour(s))  CBC     Status: None   Collection Time    05/21/14  5:18 PM      Result Value Ref Range   WBC 6.6  4.0 - 10.5 K/uL   RBC 4.22  4.22 - 5.81 MIL/uL   Hemoglobin 13.6  13.0 - 17.0 g/dL   HCT 40.6  39.0 - 52.0 %   MCV 96.2  78.0 - 100.0 fL   MCH 32.2  26.0 - 34.0 pg   MCHC 33.5  30.0 - 36.0 g/dL   RDW 13.0  11.5 - 15.5 %   Platelets 194  150 - 400 K/uL  COMPREHENSIVE METABOLIC PANEL     Status: Abnormal   Collection Time     05/21/14  5:18 PM      Result Value Ref Range   Sodium 140  137 - 147 mEq/L   Potassium 3.7  3.7 - 5.3 mEq/L   Chloride 104  96 - 112 mEq/L   CO2 23  19 - 32 mEq/L   Glucose, Bld 94  70 - 99 mg/dL   BUN 17  6 - 23 mg/dL   Creatinine, Ser 0.85  0.50 - 1.35 mg/dL   Calcium 9.0  8.4 - 10.5 mg/dL   Total Protein 7.1  6.0 - 8.3 g/dL   Albumin 3.2 (*) 3.5 - 5.2 g/dL   AST 14  0 - 37 U/L   ALT <5  0 - 53 U/L   Alkaline Phosphatase 100  39 - 117 U/L   Total Bilirubin 0.5  0.3 - 1.2 mg/dL   GFR calc non Af Amer 89 (*) >90 mL/min   GFR calc Af Amer >90  >90 mL/min   Comment: (NOTE)     The eGFR has been calculated using the CKD EPI equation.     This calculation has not been validated in all clinical situations.     eGFR's persistently <90 mL/min signify possible Chronic Kidney     Disease.   Anion gap 13  5 - 15  I-STAT CG4 LACTIC ACID, ED     Status: None   Collection Time    05/21/14  5:28 PM      Result Value Ref Range   Lactic Acid, Venous 1.34  0.5 - 2.2 mmol/L  URINALYSIS, ROUTINE W REFLEX MICROSCOPIC     Status: Abnormal   Collection Time    05/21/14  6:44 PM      Result Value Ref Range   Color, Urine YELLOW  YELLOW   APPearance CLEAR  CLEAR   Specific Gravity, Urine 1.025  1.005 - 1.030   pH 6.0  5.0 - 8.0   Glucose, UA NEGATIVE  NEGATIVE mg/dL   Hgb urine dipstick NEGATIVE  NEGATIVE   Bilirubin Urine SMALL (*) NEGATIVE   Ketones, ur 15 (*) NEGATIVE mg/dL   Protein, ur NEGATIVE  NEGATIVE mg/dL   Urobilinogen, UA 1.0  0.0 - 1.0 mg/dL   Nitrite NEGATIVE  NEGATIVE   Leukocytes, UA NEGATIVE  NEGATIVE   Comment: MICROSCOPIC NOT DONE ON URINES WITH NEGATIVE PROTEIN, BLOOD, LEUKOCYTES, NITRITE, OR GLUCOSE <1000 mg/dL.     HPI :[name] is a 68 y.o. male with a history of bipolar affective disorder, multiple sclerosis,  hypertension, coronary artery disease and chronic systolic dysfunction with ejection fraction of 20% status post biventricular ICD placement, and recent  CVA was brought to the ED today by EMS covered in his own feces and disheveled. Someone had called EMS the patient is not able to care for himself, patient does not know who called and why, but he agrees that he is not able to take care of himself. He has no complaint. But when examined but found to have multiple scars and the wound scabs on the lower extremities, he reported that he has been falling very frequently at home. He is not able to carry out his activities of daily living including bathing, grooming, cooking, grossly shopping. He claims a neighbor (lady) helps him out occasionally. He has no immediate family in [place] according to patient, a brother lives in [place]. He has personal history of alcohol abuse but he denies current use, he now smokes cigarettes occasionally after 40-pack-year of heavy smoking. He has been losing weight because of poor food intake. Since after his stroke, patient has had difficulty with his speech. He denies headache, no chest pain, no cough, no shortness of breath, no neck swelling.   HOSPITAL COURSE:  Total self-care deficit -refusing placement Adult protective services activated because of poor living situation at home Patient can go home today as no active medical issues Patient does have capacity to make his decision  Rib Fracture patient denies any pain, no difficulty breathing, has an AICD in place  Chronic systolic heart failure-currently on Lasix, Coreg, Lotensin, Aldactone  recommended that the patient be compliant with his medications, patient appears to respond appropriately and understand recommendations Coreg downward adjusted to 6.25 bid Recommend routine followup at the heart failure clinic in 1-2 weeks   HYPERTENSION  Coreg, continue Lotensin, needs BMP in one week  CORONARY ARTERY DISEASE   History of stroke patient to continue taking aspirin   Protein-calorie malnutrition, severe     Discharge Exam:   Blood pressure  91/57, pulse 59, temperature 97.4 F (36.3 C), temperature source Oral, resp. rate 19, height _0  (1.753 m), weight 57.289 kg (126 lb 4.8 oz), SpO2 95.00%.  General: Chronically ill-looking, thin built Vs Malnourished, temporal wasting, disheveled, poor grooming  HEENT: Normocephalic and Atraumatic, Mucous membranes pink  PERRLA; EOM intact; No scleral icterus,  Nares: Patent, Oropharynx: Clear, Fair Dentition  Neck: FROM, no cervical lymphadenopathy, thyromegaly, carotid bruit or JVD;  CHEST: Normal respiration, clear to auscultation bilaterally, AICD in situ  HEART: Regular rate and rhythm; no murmurs rubs or gallops.  BACK: No kyphosis or scoliosis; no CVA tenderness  ABDOMEN: Positive Bowel Sounds, soft, non-tender; no masses, no organomegaly. Adult diaper in situ  Rectal Exam: deferred  EXTREMITIES: No cyanosis, poor hygiene, multiple scars and scabs from recurrent falls  Genitalia: not examined  SKIN: Multiple scars and scabs on lower limbs attributed to recurrent falls  CNS: Alert and Oriented x 2, not oriented to time, Non-focal exam, CN 2-12 intact    Discharge Instructions   Diet - low sodium heart healthy    Complete by:  As directed      Diet - low sodium heart healthy    Complete by:  As directed      Discharge instructions    Complete by:  As directed   Please take all of your regular scheduled medications,. It has been recommended that you go  to skilled nurse facility for 24-hour care and supervision-as you have declined  this we will attempt to get a Education officer, museum as well as therapy and care management to work with helping on get a care that you need.     Increase activity slowly    Complete by:  As directed      Increase activity slowly    Complete by:  As directed            Follow-up Information   Follow up with Stacy Gardner, PA-C. Schedule an appointment as soon as possible for a visit in 1 week.   Specialty:  Physician Assistant   Contact information:    [place] [address] [phone]       Follow up with Glori Bickers, MD. Schedule an appointment as soon as possible for a visit in 2 weeks. (EF of 20%, has an AICD)    Specialty:  Cardiology   Contact information:   [address] [place] [address] [phone]       Signed: Nita Sells 05/23/2014, 8:34 AM

## 2014-05-23 NOTE — Progress Notes (Signed)
Patient seen and examined-states he wishes now short term nursing home but "doens;t want to stay there" RN to contact SW to determine disposition  Rest as per updated d/c summary  Pleas Koch, MD [facility] [phone]

## 2014-05-25 ENCOUNTER — Encounter: Payer: Self-pay | Admitting: Internal Medicine

## 2014-05-25 ENCOUNTER — Non-Acute Institutional Stay (SKILLED_NURSING_FACILITY): Payer: Medicare Other | Admitting: Internal Medicine

## 2014-05-25 DIAGNOSIS — I2510 Atherosclerotic heart disease of native coronary artery without angina pectoris: Secondary | ICD-10-CM

## 2014-05-25 DIAGNOSIS — R05 Cough: Secondary | ICD-10-CM

## 2014-05-25 DIAGNOSIS — I519 Heart disease, unspecified: Secondary | ICD-10-CM

## 2014-05-25 DIAGNOSIS — I69322 Dysarthria following cerebral infarction: Secondary | ICD-10-CM

## 2014-05-25 DIAGNOSIS — I69922 Dysarthria following unspecified cerebrovascular disease: Secondary | ICD-10-CM

## 2014-05-25 DIAGNOSIS — E43 Unspecified severe protein-calorie malnutrition: Secondary | ICD-10-CM

## 2014-05-25 DIAGNOSIS — E785 Hyperlipidemia, unspecified: Secondary | ICD-10-CM

## 2014-05-25 DIAGNOSIS — K219 Gastro-esophageal reflux disease without esophagitis: Secondary | ICD-10-CM

## 2014-05-25 DIAGNOSIS — R059 Cough, unspecified: Secondary | ICD-10-CM

## 2014-05-25 DIAGNOSIS — R6889 Other general symptoms and signs: Secondary | ICD-10-CM

## 2014-05-25 DIAGNOSIS — R5381 Other malaise: Secondary | ICD-10-CM

## 2014-05-25 DIAGNOSIS — I10 Essential (primary) hypertension: Secondary | ICD-10-CM

## 2014-05-25 DIAGNOSIS — F319 Bipolar disorder, unspecified: Secondary | ICD-10-CM

## 2014-05-25 NOTE — Progress Notes (Signed)
Patient ID: [name], male   DOB: 21-Jun-1946, 68 y.o.   MRN: [id number]     Facility: [facility]   PCP: Baltazar Apo, PA-C  Code Status: full code  Allergies  Allergen Reactions  . Meperidine Hcl Other (See Comments)    unknown    Chief Complaint: new admission  HPI:  68 y/o male patient is here for STR after hospital admission from 05/21/14-05/23/14 with him found covered in his feces and being disheveled. He was found to have multiple scars and wound scabs on lower extremities. He was noted not to be able to take care of himself. He has history of CVA, bipolar affective disorder, multiple sclerosis, hypertension, coronary artery disease and chronic systolic dysfunction with ejection fraction of 20% status post biventricular ICD placement. He is here for STR and return home. He has dysarthria making it difficult for him to express his needs and for staff to understand him. He mentions feeling cold all the time. He has no other specific concern today besides wanting his sweatshirt changed as it got wet. He is coughing in between conversation and sounds congested  Review of Systems:  Constitutional: Negative for fever, chills, diaphoresis.  HENT: Negative for sore throat.   Eyes: Negative for eye pain, blurred vision,.  Respiratory: Negative for shortness of breath and wheezing.   Cardiovascular: Negative for chest pain, palpitations, orthopnea and leg swelling.  Gastrointestinal: Negative for heartburn, nausea, vomiting, abdominal pain.  Genitourinary: Negative for dysuria  Musculoskeletal: Negative for back pain, falls.  Skin: Negative for itching and rash.  Neurological: Negative for dizziness Psychiatric/Behavioral: Negative for depression   Past Medical History  Diagnosis Date  . Coronary artery disease     s/p PCI of LAD with BMS 04/03/06 at [facility]  . Hyperlipidemia   . Hypertension   . Cerebrovascular accident     history of  cerebrovascular accident with residual left hemiparesis in 2002, seen at [facility]  . Multiple sclerosis   . Ischemic cardiomyopathy     s/p BiV ICD implanted 2007 at [facility]  . Chronic systolic congestive heart failure   . Bipolar disorder    Past Surgical History  Procedure Laterality Date  . Tonsillectomy    . Vasectomy    . Implant of automatic cardioverter defibrillator      December 26, 2004 MDT BiV ICD implanted by Dr Chales Abrahams in [facility]  . Ptca      PCI LAD with BMS 04/03/06   Social History:   reports that he quit smoking about 2 years ago. His smoking use included Cigarettes. He has a 20 pack-year smoking history. He has never used smokeless tobacco. He reports that he drinks alcohol. He reports that he does not use illicit drugs.  Family History  Problem Relation Age of Onset  . Heart disease Mother   . Kidney disease Father   . COPD Sister   . Cancer Neg Hx   . Diabetes Neg Hx     Medications: Patient's Medications  New Prescriptions   No medications on file  Previous Medications   ACETAMINOPHEN (TYLENOL) 325 MG TABLET    Take 2 tablets (650 mg total) by mouth every 6 (six) hours as needed for mild pain (or Fever >/= 101).   ASPIRIN EC 81 MG EC TABLET    Take 1 tablet (81 mg total) by mouth daily.   ATORVASTATIN (LIPITOR) 40 MG TABLET    Take 1 tablet (  40 mg total) by mouth daily.   BENAZEPRIL (LOTENSIN) 40 MG TABLET    Take 1 tablet (40 mg total) by mouth daily.   CARVEDILOL (COREG) 6.25 MG TABLET    Take 1 tablet (6.25 mg total) by mouth 2 (two) times daily with a meal.   CLOPIDOGREL (PLAVIX) 75 MG TABLET    Take 1 tablet (75 mg total) by mouth daily.   FAMOTIDINE (PEPCID) 20 MG TABLET    Take 1 tablet (20 mg total) by mouth daily.   FEEDING SUPPLEMENT, ENSURE, (ENSURE) PUDG    Take 1 Container by mouth 2 (two) times daily between meals.   FLUOXETINE (PROZAC) 20 MG CAPSULE    Take 1 capsule (20 mg total) by mouth daily.   FUROSEMIDE (LASIX) 20  MG TABLET    Take 1 tablet (20 mg total) by mouth daily.   ONDANSETRON (ZOFRAN) 4 MG TABLET    Take 1 tablet (4 mg total) by mouth every 6 (six) hours as needed for nausea.   SPIRONOLACTONE (ALDACTONE) 25 MG TABLET    Take 1 tablet (25 mg total) by mouth daily.   VALPROIC ACID (DEPAKENE) 250 MG CAPSULE    Take 1 capsule (250 mg total) by mouth 2 (two) times daily.  Modified Medications   No medications on file  Discontinued Medications   No medications on file     Physical Exam: Filed Vitals:   05/25/14 1309  BP: 104/75  Pulse: 68  Temp: 98 F (36.7 C)  Resp: 18  Height: 5\' 9"  (1.753 m)  Weight: 117 lb (53.071 kg)    General- elderly chronically ill appearing male in NAD, frail Head- atraumatic, normocephalic Eyes- PERRLA, EOMI, no pallor, no icterus, no discharge Neck- no lymphadenopathy, no thyromegaly Throat- moist mucus membrane, poor dentition Nose- no maxillary or frontal sinus tenderness Chest- no chest wall deformities Cardiovascular- normal s1,s2, no murmurs Respiratory- bilateral poor air entry and has rhonchi present, no use of accessory muscles Abdomen- bowel sounds present, soft, non tender Musculoskeletal- able to move all 4 extremities, no leg edema, generalized weakness Neurological- alert to person and place, dysarthria Skin- warm and dry, multiple scars and scabs Psychiatry- normal mood and affect   Labs reviewed: Basic Metabolic Panel:  Recent Labs  27/07/86 0310 08/07/13 0419 08/08/13 0352 08/09/13 0416 05/21/14 1718  NA 136 138 139 136 140  K 3.8 3.6 3.8 3.6 3.7  CL 101 104 105 104 104  CO2 24 22 22 23 23   GLUCOSE 102* 73 69* 127* 94  BUN 20 19 18 16 17   CREATININE 0.80 0.87 0.87 0.98 0.85  CALCIUM 8.5 8.3* 8.4 8.4 9.0  MG 1.9 2.0 1.9  --   --   PHOS 3.5 2.7 2.2*  --   --    Liver Function Tests:  Recent Labs  08/05/13 1053 08/05/13 1645 05/21/14 1718  AST 19 24 14   ALT 9 9 <5  ALKPHOS 84 84 100  BILITOT 0.4 0.8 0.5  PROT 7.4  7.3 7.1  ALBUMIN 2.7* 2.8* 3.2*    Recent Labs  08/05/13 1645  LIPASE 12  AMYLASE 55    Recent Labs  08/05/13 1053  AMMONIA 14   CBC:  Recent Labs  08/05/13 1053  08/06/13 0310  08/08/13 0352 08/09/13 0416 05/21/14 1718  WBC 13.8*  --  12.6*  < > 7.3 7.0 6.6  NEUTROABS 12.4*  --  10.2*  --   --   --   --  HGB 12.6*  < > 12.1*  < > 11.0* 10.3* 13.6  HCT 37.2*  < > 36.5*  < > 32.5* 31.3* 40.6  MCV 97.9  --  99.7  < > 98.5 98.7 96.2  PLT 243  --  215  < > 216 213 194  < > = values in this interval not displayed. Cardiac Enzymes:  Recent Labs  08/05/13 1349 08/05/13 1627 08/05/13 2235 08/06/13 0310  CKTOTAL 92  --   --   --   TROPONINI  --  <0.30 <0.30 <0.30   BNP: No components found with this basename: POCBNP,  CBG:  Recent Labs  08/11/13 2114 08/12/13 0657 08/12/13 1140  GLUCAP 105* 80 93    Radiological Exams: Dg Chest 2 View  05/21/2014   CLINICAL DATA:  Failure to thrive.  EXAM: CHEST  2 VIEW  COMPARISON: Chest radiograph 08/11/2013  FINDINGS: Stable enlargement of the cardiopericardial silhouette. Stable mediastinal and hilar contours. Left subclavian AICD with leads terminating in the right atrium, right ventricle, and coronary sinus. Pulmonary vascularity is within normal limits. No airspace disease or pleural effusion. Lungs clear. Negative for pneumothorax.  There are two acute appearing right-sided rib fractures, involving the lateral right ninth and lateral tenth ribs.  IMPRESSION: Two acute appearing right rib fractures (the lateral ninth and tenth ribs).  Cardiomegaly with AICD. No acute cardiopulmonary disease identified.   Electronically Signed   By: Britta Mccreedy M.D.   On: 05/21/2014 18:22    Assessment/Plan  Physical deconditioning Will have him work with physical therapy and occupational therapy team to help with gait training and muscle strengthening exercises.fall precautions. Skin care. Encourage to be out of bed.   Cough With  chest congestion on exam. Has poor dentition and dysarthria. His aspiration risk. Will have SLP team evaluate and modify diet. Aspiration precautions. Will get chest xray to rule out aspiration pneumonia  Cold intolerance Check for hypothyroidism  Protein calorie malnutrition Ensure pudding supplement, monitor weight and po intake  CVA With dysarthria. Continue aspirin and lipitor with bp medications  CHF Euvolemic, continue b blocker and ACEI with lasix 20 mg daily and aldactone 25 mg daily, monitor weight, BMP. Has AICD in place  Rib fracture In ni distress, denies pain, can give prn tylenol  HTN bp reading stable, continue current med, no changes made  CAD Remains chest pain free. Continue benazepril 40 mg daily, coreg 6.25 mg bid, plavix 75 mg daily, aspirin 81 mg daily and lipitor 40 mg daily  Hyperlipidemia Continue lipitor 40 mg daily  gerd Continue pepcid 20 mg daily  Bipolar disorder Continue prozac 20 mg daily and valproic acid 250 mg bid    Family/ staff Communication: reviewed care plan with patient and nursing supervisor  Goals of care: possible transfer to ALF vs LTC   Labs/tests ordered: cbc with diff, cmp, tsh cxr    Oneal Grout, MD  [facility] [phone] (Monday-Friday 8 am - 5 pm) [phone] (afterhours)

## 2014-05-26 ENCOUNTER — Other Ambulatory Visit: Payer: Self-pay | Admitting: Internal Medicine

## 2014-05-26 LAB — COMPREHENSIVE METABOLIC PANEL
ALT: <8 U/L (ref 0–53)
AST: 17 U/L (ref 0–37)
Albumin: 3.1 g/dL — ABNORMAL LOW (ref 3.5–5.2)
Alkaline Phosphatase: 101 U/L (ref 39–117)
BUN: 27 mg/dL — ABNORMAL HIGH (ref 6–23)
CO2: 24 mEq/L (ref 19–32)
Calcium: 8.8 mg/dL (ref 8.4–10.5)
Chloride: 105 mEq/L (ref 96–112)
Creat: 1.1 mg/dL (ref 0.50–1.35)
Glucose, Bld: 87 mg/dL (ref 70–99)
Potassium: 4.5 mEq/L (ref 3.5–5.3)
Sodium: 141 mEq/L (ref 135–145)
Total Bilirubin: 0.2 mg/dL (ref 0.2–1.2)
Total Protein: 6.1 g/dL (ref 6.0–8.3)

## 2014-05-26 LAB — TSH: TSH: 1.34 u[IU]/mL (ref 0.350–4.500)

## 2014-05-26 LAB — CBC WITH DIFFERENTIAL/PLATELET
HCT: 34 % — ABNORMAL LOW (ref 39.0–52.0)
Hemoglobin: 11.6 g/dL — ABNORMAL LOW (ref 13.0–17.0)
Lymphocytes Relative: 27 % (ref 12–46)
Lymphs Abs: 2 10*3/uL (ref 0.7–4.0)
MCH: 33.8 pg (ref 26.0–34.0)
MCHC: 34.1 g/dL (ref 30.0–36.0)
MCV: 99.2 fL (ref 78.0–100.0)
Monocytes Absolute: 0.5 10*3/uL (ref 0.1–1.0)
Monocytes Relative: 7 % (ref 3–12)
Neutro Abs: 5 10*3/uL (ref 1.7–7.7)
Neutrophils Relative %: 66 % (ref 43–77)
Platelets: 165 10*3/uL (ref 150–400)
RBC: 3.43 MIL/uL — ABNORMAL LOW (ref 4.22–5.81)
RDW: 13.4 % (ref 11.5–15.5)
WBC: 7.5 10*3/uL (ref 4.0–10.5)

## 2014-06-09 ENCOUNTER — Encounter: Payer: Self-pay | Admitting: Internal Medicine

## 2014-06-09 ENCOUNTER — Non-Acute Institutional Stay (SKILLED_NURSING_FACILITY): Payer: Medicare Other | Admitting: Internal Medicine

## 2014-06-09 DIAGNOSIS — E43 Unspecified severe protein-calorie malnutrition: Secondary | ICD-10-CM

## 2014-06-09 DIAGNOSIS — I519 Heart disease, unspecified: Secondary | ICD-10-CM

## 2014-06-09 DIAGNOSIS — I639 Cerebral infarction, unspecified: Secondary | ICD-10-CM

## 2014-06-09 DIAGNOSIS — F3160 Bipolar disorder, current episode mixed, unspecified: Secondary | ICD-10-CM

## 2014-06-09 DIAGNOSIS — I25119 Atherosclerotic heart disease of native coronary artery with unspecified angina pectoris: Secondary | ICD-10-CM

## 2014-06-09 DIAGNOSIS — R5381 Other malaise: Secondary | ICD-10-CM

## 2014-06-09 NOTE — Progress Notes (Signed)
Patient ID: Kenneth PoundsDavid J Reese, male   DOB: 05/21/1946, 68 y.o.   MRN: [id number]     [facility]  Chief complaint- discharge visit  Allergies reviewed  HPI 68 Y/o patient is seen today for discharge visit. Patient was here for short term rehabilitation and has worked with therapy team.  He has history of CVA, bipolar affective disorder, multiple sclerosis, hypertension, coronary artery disease and chronic systolic dysfunction with ejection fraction of 20% status post biventricular ICD placement. He has dysarthria  Review of Systems:  Constitutional: Negative for fever, chills, diaphoresis.  HENT: Negative for sore throat.  Marland Kitchen.  Respiratory: Negative for shortness of breath and wheezing.   Cardiovascular: Negative for chest pain, palpitations, orthopnea and leg swelling.  Gastrointestinal: Negative for heartburn, nausea, vomiting, abdominal pain.  Genitourinary: Negative for dysuria  Musculoskeletal: Negative for back pain, falls.  Skin: Negative for itching and rash.  Neurological: Negative for dizziness Psychiatric/Behavioral: Negative for depression     Medication reviewed. [facility]   Medication List       This list is accurate as of: 06/09/14 12:13 PM.  Always use your most recent med list.               acetaminophen 325 MG tablet  Commonly known as:  TYLENOL  Take 2 tablets (650 mg total) by mouth every 6 (six) hours as needed for mild pain (or Fever >/= 101).     aspirin 81 MG EC tablet  Take 1 tablet (81 mg total) by mouth daily.     atorvastatin 40 MG tablet  Commonly known as:  LIPITOR  Take 1 tablet (40 mg total) by mouth daily.     benazepril 40 MG tablet  Commonly known as:  LOTENSIN  Take 1 tablet (40 mg total) by mouth daily.     carvedilol 25 MG tablet  Commonly known as:  COREG  Take 25 mg by mouth 2 (two) times daily with a meal.     clopidogrel 75 MG tablet  Commonly known as:  PLAVIX  Take 1 tablet (75 mg total) by  mouth daily.     famotidine 20 MG tablet  Commonly known as:  PEPCID  Take 1 tablet (20 mg total) by mouth daily.     FLUoxetine 20 MG capsule  Commonly known as:  PROZAC  Take 1 capsule (20 mg total) by mouth daily.     furosemide 20 MG tablet  Commonly known as:  LASIX  Take 1 tablet (20 mg total) by mouth daily.     ondansetron 4 MG tablet  Commonly known as:  ZOFRAN  Take 1 tablet (4 mg total) by mouth every 6 (six) hours as needed for nausea.     spironolactone 25 MG tablet  Commonly known as:  ALDACTONE  Take 1 tablet (25 mg total) by mouth daily.     valproic acid 250 MG capsule  Commonly known as:  DEPAKENE  Take 1 capsule (250 mg total) by mouth 2 (two) times daily.        Physical exam BP 118/63  Pulse 62  Temp(Src) 97.5 F (36.4 C)  Resp 18  Ht 5\' 9"  (1.753 m)  Wt 117 lb (53.071 kg)  BMI 17.27 kg/m2  SpO2 96%  General- elderly male in no acute distress Head- atraumatic, normocephalic Cardiovascular- normal s1,s2, no murmurs Respiratory- bilateral clear to auscultation, no wheeze, no rhonchi, no crackles Abdomen- bowel sounds present, soft, non tender Musculoskeletal- able to  move all 4 extremities Neurological- no focal deficit Psychiatry- alert and oriented  Skin- warm and dry  Labs reviewed  Assessment/Plan  Protein calorie malnutrition monitor weight and po intake  CVA With dysarthria. Continue aspirin and lipitor with bp medications  CHF Euvolemic, continue b blocker and ACEI with lasix 20 mg daily and aldactone 25 mg daily, monitor weight, BMP. Has AICD in place  CAD Remains chest pain free. Continue benazepril 40 mg daily, coreg 6.25 mg bid, plavix 75 mg daily, aspirin 81 mg daily and lipitor 40 mg daily  Bipolar disorder Continue prozac 20 mg daily and valproic acid 250 mg bid  Physical deconditioning Patient has worked with PT, OT, SLP team for strengthening, mobility and self care training. He has MS, weakness of right  extremities and has impaired ability to perform daily activities without assistance. Patient can safely propel wheelchair in the home or has a caregiver who can provide assistance. He needs 24 hour day care.   Home health services: PT, OT, SLP  DME required: rolling walker with skis, narrow wheelchair  PCP follow up in 1-2 weeks post discharge  30 days supply of prescription medications provided  Plan of care discussed with patient and verbalizes understanding this. Reviewed care plan with nursing staff

## 2014-06-14 ENCOUNTER — Ambulatory Visit (INDEPENDENT_AMBULATORY_CARE_PROVIDER_SITE_OTHER): Payer: Medicare Other | Admitting: *Deleted

## 2014-06-14 DIAGNOSIS — I255 Ischemic cardiomyopathy: Secondary | ICD-10-CM

## 2014-06-14 DIAGNOSIS — I519 Heart disease, unspecified: Secondary | ICD-10-CM

## 2014-06-14 DIAGNOSIS — I5022 Chronic systolic (congestive) heart failure: Secondary | ICD-10-CM

## 2014-06-14 LAB — MDC_IDC_ENUM_SESS_TYPE_INCLINIC
Battery Voltage: 2.65 V
Brady Statistic AP VS Percent: 0.04 %
Brady Statistic AS VS Percent: 2.88 %
Brady Statistic RA Percent Paced: 28.01 %
Brady Statistic RV Percent Paced: 97.08 %
Date Time Interrogation Session: 20151007204247
HIGH POWER IMPEDANCE MEASURED VALUE: 58 Ohm
HighPow Impedance: 44 Ohm
Lead Channel Impedance Value: 285 Ohm
Lead Channel Impedance Value: 418 Ohm
Lead Channel Impedance Value: 589 Ohm
Lead Channel Impedance Value: 779 Ohm
Lead Channel Pacing Threshold Amplitude: 0.5 V
Lead Channel Pacing Threshold Amplitude: 1.25 V
Lead Channel Pacing Threshold Pulse Width: 0.4 ms
Lead Channel Pacing Threshold Pulse Width: 0.4 ms
Lead Channel Pacing Threshold Pulse Width: 0.8 ms
Lead Channel Sensing Intrinsic Amplitude: 15.125 mV
Lead Channel Sensing Intrinsic Amplitude: 16.125 mV
Lead Channel Sensing Intrinsic Amplitude: 2.75 mV
Lead Channel Setting Pacing Amplitude: 2 V
Lead Channel Setting Pacing Amplitude: 2.5 V
Lead Channel Setting Pacing Amplitude: 3.75 V
Lead Channel Setting Pacing Pulse Width: 0.4 ms
Lead Channel Setting Pacing Pulse Width: 0.8 ms
Lead Channel Setting Sensing Sensitivity: 0.45 mV
MDC IDC MSMT LEADCHNL LV IMPEDANCE VALUE: 589 Ohm
MDC IDC MSMT LEADCHNL RA PACING THRESHOLD AMPLITUDE: 0.5 V
MDC IDC MSMT LEADCHNL RA SENSING INTR AMPL: 3 mV
MDC IDC STAT BRADY AP VP PERCENT: 27.97 %
MDC IDC STAT BRADY AS VP PERCENT: 69.11 %
Zone Setting Detection Interval: 300 ms
Zone Setting Detection Interval: 350 ms
Zone Setting Detection Interval: 350 ms
Zone Setting Detection Interval: 400 ms

## 2014-06-15 NOTE — Progress Notes (Signed)
CRT-D device check in office (last 09/06/12). Thresholds and sensing consistent with previous device measurements. All (3) LV vectors tested---LVring-RVcoil had best threshold (1.5V@0 .17ms) with impedance of 285 ohms---no permanent changes made. Lead impedance trends stable over time. SIC=0 [phone] lead)---pt has magnet---aware of when to use. 13 mode switch episodes recorded(<0.1%)---max dur. 1 min 4 sec, Max A 157, Max Avg V 118. 20 non-treated ventricular arrhythmia episodes recorded---max dur. 27 sec, Max Avg V 286---AT/NSVT. 4 treated episodes, ATP---max dur. 27 sec, Max Avg V 214---FFRW noted/?SVT for treated events---last 04/12/14---pt admits to stopping meds---recently resumed. 4,235 VS episodes---brief NSVT per markers---most recent 10/6. (1) VS from 03/16/13 x 1 hr 21 mins @ 273/167---?AF/AFL---no EGMs + ASA/Plavix---h/o CAD. Patient bi-ventricularly pacing 93.4% of the time with 2.0% as VSRp. Device programmed at appropriate safety margins. Heart failure diagnostics reviewed and appear to be WNL at present. Audible alerts demonstrated for patient. LV output increased to appropriate 1V safety margin. Batt voltage 2.65V (ERI 2.63V)  Patient will follow up with the Device Clinic on 11-16 for batt ck and with JA on 09-25-14 to re-establish.

## 2014-06-16 ENCOUNTER — Ambulatory Visit (INDEPENDENT_AMBULATORY_CARE_PROVIDER_SITE_OTHER): Payer: Medicare Other | Admitting: Family

## 2014-06-16 ENCOUNTER — Encounter: Payer: Self-pay | Admitting: Family

## 2014-06-16 VITALS — BP 100/64 | HR 74 | Temp 97.6°F | Resp 16 | Ht 69.0 in

## 2014-06-16 DIAGNOSIS — F319 Bipolar disorder, unspecified: Secondary | ICD-10-CM

## 2014-06-16 DIAGNOSIS — Z8673 Personal history of transient ischemic attack (TIA), and cerebral infarction without residual deficits: Secondary | ICD-10-CM

## 2014-06-16 DIAGNOSIS — Z23 Encounter for immunization: Secondary | ICD-10-CM

## 2014-06-16 DIAGNOSIS — I519 Heart disease, unspecified: Secondary | ICD-10-CM

## 2014-06-16 DIAGNOSIS — R4189 Other symptoms and signs involving cognitive functions and awareness: Secondary | ICD-10-CM

## 2014-06-16 DIAGNOSIS — E43 Unspecified severe protein-calorie malnutrition: Secondary | ICD-10-CM

## 2014-06-16 DIAGNOSIS — R6889 Other general symptoms and signs: Secondary | ICD-10-CM

## 2014-06-16 NOTE — Assessment & Plan Note (Signed)
Continues to have poor intake. Discussed with caretaker to have him start drinking boost or ensure between meals to encourage increase in caloric intake. Will see patient in 1 month to determine progress.

## 2014-06-16 NOTE — Progress Notes (Signed)
Pre visit review using our clinic review tool, if applicable. No additional management support is needed unless otherwise documented below in the visit note. 

## 2014-06-16 NOTE — Assessment & Plan Note (Signed)
Recently discharged from nursing facility following hospitalization. Concern for patient's ability to care for himself. He is adamant that he does not want home health services at this time. Social services consult being considered.

## 2014-06-16 NOTE — Patient Instructions (Signed)
Thank you for choosing [facility].  Summary/Instructions:   Please continue to take your medications as prescribed.  Please consider the home health services which will certainly help. If you change your mind please call me and we can arrange for those services.  Please follow up in 1-2 months or sooner if needed.  Include Ensure or Boost in your diet to supplement your nutrition.  Keep your appointment with cardiology

## 2014-06-16 NOTE — Assessment & Plan Note (Signed)
Appears stable at current time. Continue prozac and valproic acid.

## 2014-06-16 NOTE — Assessment & Plan Note (Signed)
Dysarthria remains present. He is stable with his current prevention of aspirin.

## 2014-06-16 NOTE — Progress Notes (Signed)
Subjective:    Patient ID: [name], male    DOB: 02-Jun-1946, 68 y.o.   MRN: [id number]  HPI:  [name] is a 68 y.o. male with multiple medical conditions who presents today for hospital follow-up. HIs caretaker is with him for this visit. He was recently admitted to [facility] after being found covered in feces and dissheveled with multiple wounds and scars from multiple falls at home. At that time he indicated he was not able to care for himself. Caretaker indicates she was away from him to take care of something and a neighbor called EMS. He was found to have 2 potential rib fractures. Following hospitalization he was admitted to a nursing care facility  From 9/17 until 10/2. He is currently living in a double wide trailer with his caretaker. He is refusing all of his therapies that were ordered by Dr. Glade Lloyd of the nursing home.   Allergies  Allergen Reactions  . Meperidine Hcl Other (See Comments)    unknown   Current Outpatient Prescriptions on File Prior to Visit  Medication Sig Dispense Refill  . aspirin EC 81 MG EC tablet Take 1 tablet (81 mg total) by mouth daily.      Marland Kitchen atorvastatin (LIPITOR) 40 MG tablet Take 1 tablet (40 mg total) by mouth daily.      . benazepril (LOTENSIN) 40 MG tablet Take 1 tablet (40 mg total) by mouth daily.      . carvedilol (COREG) 25 MG tablet Take 25 mg by mouth 2 (two) times daily with a meal.      . clopidogrel (PLAVIX) 75 MG tablet Take 1 tablet (75 mg total) by mouth daily.  30 tablet  6  . famotidine (PEPCID) 20 MG tablet Take 1 tablet (20 mg total) by mouth daily.      Marland Kitchen FLUoxetine (PROZAC) 20 MG capsule Take 1 capsule (20 mg total) by mouth daily.    3  . furosemide (LASIX) 20 MG tablet Take 1 tablet (20 mg total) by mouth daily.  30 tablet    . spironolactone (ALDACTONE) 25 MG tablet Take 1 tablet (25 mg total) by mouth daily.      Marland Kitchen valproic acid (DEPAKENE) 250 MG capsule Take 1 capsule (250 mg total) by mouth 2 (two) times daily.       Marland Kitchen acetaminophen (TYLENOL) 325 MG tablet Take 2 tablets (650 mg total) by mouth every 6 (six) hours as needed for mild pain (or Fever >/= 101).       No current facility-administered medications on file prior to visit.   Past Medical History  Diagnosis Date  . Coronary artery disease     s/p PCI of LAD with BMS 04/03/06 at [facility]  . Hyperlipidemia   . Hypertension   . Cerebrovascular accident     history of cerebrovascular accident with residual left hemiparesis in 2002, seen at [facility]  . Multiple sclerosis   . Ischemic cardiomyopathy     s/p BiV ICD implanted 2007 at [facility]  . Chronic systolic congestive heart failure   . Bipolar disorder     Review of Systems  Constitutional: Positive for fatigue. Negative for fever and chills.       Caretaker indicates he is eating, just not a whole lot. She tries to get him to eat as much as possible and his response is "I'm not hungry."  Respiratory: Positive for wheezing (Present on occasion). Negative for chest  tightness and shortness of breath.   Cardiovascular: Negative for chest pain, palpitations and leg swelling.  Gastrointestinal: Negative for nausea, vomiting, abdominal pain, diarrhea and constipation.       Caretaker indicates that he is incontinent of stool - which she says is a possible reason he doesn't want to eat.   Genitourinary: Negative for difficulty urinating.  Neurological: Positive for facial asymmetry (Dysarthric from a previous stroke). Negative for headaches.      Objective:     BP 100/64  Pulse 74  Temp(Src) 97.6 F (36.4 C) (Oral)  Resp 16  Ht 5\' 9"  (1.753 m)  SpO2 94% Nursing note and vital signs reviewed.  Physical Exam  Constitutional: He is oriented to person, place, and time.  Frail, cachetic appearing male who appears older than his stated age is seated in a wheelchair in NAD. He is dressed appropriately. His speech is dysarthric and is able to answer questions  appropriately. He is adamant about not having home health services.   HENT:  Right Ear: External ear normal.  Left Ear: External ear normal.  Nose: Nose normal.  Mouth/Throat: Oropharynx is clear and moist.  Eyes: Conjunctivae are normal. Pupils are equal, round, and reactive to light.  Neck: Neck supple.  Cardiovascular: Normal rate, regular rhythm, normal heart sounds and intact distal pulses.   Pulmonary/Chest: Effort normal and breath sounds normal.  Abdominal: Soft. Bowel sounds are normal. He exhibits no distension.  Musculoskeletal: He exhibits no edema and no tenderness.  Neurological: He is alert and oriented to person, place, and time.  Skin: Skin is warm and dry.  Psychiatric:  He is calm and cooperative. Judgement is occasionally off throughout the exam.        Assessment & Plan:

## 2014-06-16 NOTE — Assessment & Plan Note (Signed)
CHF appears stable with current medication regimen. He is being followed by Dr. Johney Frame. Pt indicates that his next appointment is in November.

## 2014-06-23 ENCOUNTER — Telehealth: Payer: Self-pay | Admitting: Internal Medicine

## 2014-06-23 ENCOUNTER — Encounter: Payer: Self-pay | Admitting: Internal Medicine

## 2014-07-05 ENCOUNTER — Encounter: Payer: Self-pay | Admitting: Internal Medicine

## 2014-07-05 ENCOUNTER — Ambulatory Visit (INDEPENDENT_AMBULATORY_CARE_PROVIDER_SITE_OTHER): Payer: Medicare Other | Admitting: Internal Medicine

## 2014-07-05 VITALS — BP 78/52 | HR 67 | Ht 69.0 in

## 2014-07-05 DIAGNOSIS — I255 Ischemic cardiomyopathy: Secondary | ICD-10-CM

## 2014-07-05 DIAGNOSIS — Z95810 Presence of automatic (implantable) cardiac defibrillator: Secondary | ICD-10-CM

## 2014-07-05 DIAGNOSIS — I519 Heart disease, unspecified: Secondary | ICD-10-CM

## 2014-07-05 DIAGNOSIS — I5022 Chronic systolic (congestive) heart failure: Secondary | ICD-10-CM

## 2014-07-05 DIAGNOSIS — E43 Unspecified severe protein-calorie malnutrition: Secondary | ICD-10-CM

## 2014-07-05 DIAGNOSIS — I10 Essential (primary) hypertension: Secondary | ICD-10-CM

## 2014-07-05 DIAGNOSIS — Z720 Tobacco use: Secondary | ICD-10-CM

## 2014-07-05 LAB — MDC_IDC_ENUM_SESS_TYPE_INCLINIC
Lead Channel Setting Pacing Amplitude: 2 V
Lead Channel Setting Pacing Amplitude: 2.5 V
Lead Channel Setting Pacing Pulse Width: 0.4 ms
Lead Channel Setting Pacing Pulse Width: 0.8 ms
Lead Channel Setting Sensing Sensitivity: 0.45 mV
MDC IDC SET LEADCHNL LV PACING AMPLITUDE: 2.5 V
MDC IDC SET ZONE DETECTION INTERVAL: 300 ms
Zone Setting Detection Interval: 350 ms
Zone Setting Detection Interval: 350 ms
Zone Setting Detection Interval: 400 ms

## 2014-07-05 LAB — CBC WITH DIFFERENTIAL/PLATELET
BASOS ABS: 0.1 10*3/uL (ref 0.0–0.1)
Basophils Relative: 0.9 % (ref 0.0–3.0)
EOS ABS: 0.1 10*3/uL (ref 0.0–0.7)
Eosinophils Relative: 1.9 % (ref 0.0–5.0)
HCT: 36.5 % — ABNORMAL LOW (ref 39.0–52.0)
Hemoglobin: 12 g/dL — ABNORMAL LOW (ref 13.0–17.0)
Lymphocytes Relative: 15.7 % (ref 12.0–46.0)
Lymphs Abs: 1.2 10*3/uL (ref 0.7–4.0)
MCHC: 32.9 g/dL (ref 30.0–36.0)
MCV: 97 fl (ref 78.0–100.0)
MONO ABS: 0.5 10*3/uL (ref 0.1–1.0)
Monocytes Relative: 7.2 % (ref 3.0–12.0)
NEUTROS PCT: 74.3 % (ref 43.0–77.0)
Neutro Abs: 5.7 10*3/uL (ref 1.4–7.7)
PLATELETS: 228 10*3/uL (ref 150.0–400.0)
RBC: 3.76 Mil/uL — ABNORMAL LOW (ref 4.22–5.81)
RDW: 14.1 % (ref 11.5–15.5)
WBC: 7.6 10*3/uL (ref 4.0–10.5)

## 2014-07-05 LAB — BASIC METABOLIC PANEL
BUN: 22 mg/dL (ref 6–23)
CALCIUM: 8.8 mg/dL (ref 8.4–10.5)
CO2: 24 meq/L (ref 19–32)
CREATININE: 1.1 mg/dL (ref 0.4–1.5)
Chloride: 106 mEq/L (ref 96–112)
GFR: 74.88 mL/min (ref >60.00)
Glucose, Bld: 108 mg/dL — ABNORMAL HIGH (ref 70–99)
Potassium: 3.9 mEq/L (ref 3.5–5.1)
Sodium: 138 mEq/L (ref 135–145)

## 2014-07-05 LAB — MAGNESIUM: Magnesium: 1.8 mg/dL (ref 1.5–2.5)

## 2014-07-05 MED ORDER — BENAZEPRIL HCL 20 MG PO TABS: 20.0000 mg | ORAL_TABLET | Freq: Every day | ORAL | Status: DC

## 2014-07-05 NOTE — Patient Instructions (Signed)
Your physician recommends that you schedule a follow-up appointment in: 1 month with device clinic and 6 months with Dr Johney Frame  Your physician recommends that you return for lab work today: BMP/mag/CBC   Your physician has recommended you make the following change in your medication:  1) Decrease Benazapril 20 mg daily

## 2014-07-06 ENCOUNTER — Encounter: Payer: Self-pay | Admitting: Internal Medicine

## 2014-07-06 DIAGNOSIS — I255 Ischemic cardiomyopathy: Secondary | ICD-10-CM | POA: Insufficient documentation

## 2014-07-06 NOTE — Progress Notes (Signed)
PCP: Dr Debby Bud Primary Cardiologist: Previously Dr Serena Croissant is a 68 y.o. male with a h/o ischemic CM and chronic systolic dysfunction sp BiV ICD implantation (MDT) by Dr Chales Abrahams  who presents today for follow-up in the  Electrophysiology device clinic.  He is s/p PCI of the LAD 2007 at [facility].  He also has chronic systolic dysfunction and underwent BiV ICD implantation 2006.  His EF remains depressed.   He has bipolar disorder which is stable.  He is primarily limited by his Multiple Sclerosis.  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  She says that he spends most of his day in bed and is not ambulatory at this time.  She says that he is functionally very limited.  He admits he has very little interests in life.  He has been noncompliant with device clinic follow-up.   Today, he  denies symptoms of palpitations, chest pain, shortness of breath, orthopnea, PND, lower extremity edema, dizziness, presyncope, or syncope.  He has a watery/ itchy right eye chronically as  Well as difficulty swallowing.  He also has occasional diarrhea.  The patientis tolerating medications without difficulties and is otherwise without complaint today.   Past Medical History  Diagnosis Date  . Coronary artery disease     s/p PCI of LAD with BMS 04/03/06 at [facility]  . Hyperlipidemia   . Hypertension   . Cerebrovascular accident     history of cerebrovascular accident with residual left hemiparesis in 2002, seen at [facility]  . Multiple sclerosis   . Ischemic cardiomyopathy     s/p BiV ICD implanted 2007 at [facility]  . Chronic systolic congestive heart failure   . Bipolar disorder   . Failure to thrive (0-17)    Past Surgical History  Procedure Laterality Date  . Tonsillectomy    . Vasectomy    . Implant of automatic cardioverter defibrillator      December 26, 2004 MDT BiV ICD implanted by Dr Chales Abrahams in  [facility]  . Ptca      PCI LAD with BMS 04/03/06    History   Social History  . Marital Status: Divorced    Spouse Name: N/A    Number of Children: N/A  . Years of Education: N/A   Occupational History  . Not on file.   Social History Main Topics  . Smoking status: Former Smoker -- 0.50 packs/day for 40 years    Types: Cigarettes    Quit date: 10/13/2011  . Smokeless tobacco: Never Used     Comment: has tried to quit "many times" will continue to try to quit  . Alcohol Use: Yes     Comment: remote heavy ETOH, "years ago"  . Drug Use: No  . Sexual Activity: Not on file   Other Topics Concern  . Not on file   Social History Narrative   HSG. [employer] - 09 months. Married '70's - 31yrs/divorce. Married '80's - 10 yrs/divorced. 1 son - '71, 1 drtr '73. Has grandchildren. Lives alone. On disability.  Lives in [place].    Family History  Problem Relation Age of Onset  . Heart disease Mother   . Kidney disease Father   . COPD Sister   . Cancer Neg Hx   . Diabetes Neg Hx     Allergies  Allergen Reactions  . Meperidine Hcl Other (See Comments)  unknown    Current Outpatient Prescriptions  Medication Sig Dispense Refill  . acetaminophen (TYLENOL) 325 MG tablet Take 2 tablets (650 mg total) by mouth every 6 (six) hours as needed for mild pain (or Fever >/= 101).      Marland Kitchen aspirin EC 81 MG EC tablet Take 1 tablet (81 mg total) by mouth daily.      Marland Kitchen atorvastatin (LIPITOR) 40 MG tablet Take 1 tablet (40 mg total) by mouth daily.      . benazepril (LOTENSIN) 20 MG tablet Take 1 tablet (20 mg total) by mouth daily.      . carvedilol (COREG) 25 MG tablet Take 25 mg by mouth 2 (two) times daily with a meal.      . clopidogrel (PLAVIX) 75 MG tablet Take 1 tablet (75 mg total) by mouth daily.  30 tablet  6  . famotidine (PEPCID) 20 MG tablet Take 1 tablet (20 mg total) by mouth daily.      Marland Kitchen FLUoxetine (PROZAC) 20 MG capsule Take 1 capsule (20 mg total) by mouth daily.    3  .  furosemide (LASIX) 20 MG tablet Take 1 tablet (20 mg total) by mouth daily.  30 tablet    . spironolactone (ALDACTONE) 25 MG tablet Take 1 tablet (25 mg total) by mouth daily.      Marland Kitchen valproic acid (DEPAKENE) 250 MG capsule Take 1 capsule (250 mg total) by mouth 2 (two) times daily.       No current facility-administered medications for this visit.   Physical Exam: Filed Vitals:   07/05/14 1059  BP: 78/52  Pulse: 67  Height: 5\' 9"  (1.753 m)    GEN- The patient is frail and chronically ill appearing, alert and oriented x 3 today.  dissheveled Head- normocephalic, atraumatic Eyes-  Eye drainage is noted Ears- hearing intact Oropharynx- clear Neck- supple, no JVP Lungs- Clear to ausculation bilaterally, normal work of breathing Chest- BiV ICDt is well healed Heart- Regular rate and rhythm, no murmurs, rubs or gallops, PMI not laterally displaced GI- soft, NT, ND, + BS Extremities- no clubbing, cyanosis, trace edema  BiV ICD interrogation- reviewed in detail today,  See PACEART report  Assessment and Plan:  1. Ischemic CM/ chronic systolic dysfunction The patient has chronic systolic dysfunction He is s/p BiV ICD which is approaching ERI.  He has been noncompliant with device follow-up and medical care.  He appears to have very little interest in his health at this time.  Today, I discussed his care at length with the patient and also his caregiver.  I have advised that he consider device downgrade to CRT-P when he reaches ERI given his overall life preferences.  He is in agreement with this approach. The importance of compliance with monthly device follow-up was discussed with the patient today.  I will see him again in either 6 months or sooner if he reaches ERI battery status.  The importance of compliance with medicines was discussed today.  2. Protein- calorie malnutrition- severe Adequate caloric intake is essential  3. Hypotension Decrease benazapril today Bmet, CBC  today  4. Hypertension Presently hypotensive as above  Return monthly for battery check

## 2014-07-07 ENCOUNTER — Telehealth: Payer: Self-pay | Admitting: *Deleted

## 2014-07-07 MED ORDER — VALPROIC ACID 250 MG PO CAPS: 250.0000 mg | ORAL_CAPSULE | Freq: Two times a day (BID) | ORAL | Status: DC

## 2014-07-07 MED ORDER — BENAZEPRIL HCL 20 MG PO TABS: 20.0000 mg | ORAL_TABLET | Freq: Every day | ORAL | Status: DC

## 2014-07-07 MED ORDER — CARVEDILOL 25 MG PO TABS: 25.0000 mg | ORAL_TABLET | Freq: Two times a day (BID) | ORAL | Status: DC

## 2014-07-07 MED ORDER — FAMOTIDINE 20 MG PO TABS: 20.0000 mg | ORAL_TABLET | Freq: Every day | ORAL | Status: DC

## 2014-07-07 MED ORDER — FUROSEMIDE 20 MG PO TABS: 20.0000 mg | ORAL_TABLET | Freq: Every day | ORAL | Status: DC

## 2014-07-07 MED ORDER — CLOPIDOGREL BISULFATE 75 MG PO TABS: 75.0000 mg | ORAL_TABLET | Freq: Every day | ORAL | Status: DC

## 2014-07-07 MED ORDER — SPIRONOLACTONE 25 MG PO TABS
25.0000 mg | ORAL_TABLET | Freq: Every day | ORAL | Status: DC
Start: 2014-07-07 — End: 2015-01-19

## 2014-07-07 MED ORDER — ATORVASTATIN CALCIUM 40 MG PO TABS: 40.0000 mg | ORAL_TABLET | Freq: Every day | ORAL | Status: DC

## 2014-07-07 MED ORDER — FLUOXETINE HCL 20 MG PO CAPS: 20.0000 mg | ORAL_CAPSULE | Freq: Every day | ORAL | Status: DC

## 2014-07-07 NOTE — Telephone Encounter (Signed)
Pt stated pt is needed refills on all of his medications. Pt saw Tammy Sours 06/16/14 new patient pharmacist states md need to send new scripts. Verified pharmacy inform pt will send to [facility]...Raechel Chute

## 2014-08-07 ENCOUNTER — Ambulatory Visit (INDEPENDENT_AMBULATORY_CARE_PROVIDER_SITE_OTHER): Payer: Medicare Other | Admitting: Family

## 2014-08-07 ENCOUNTER — Encounter: Payer: Self-pay | Admitting: Family

## 2014-08-07 VITALS — BP 110/72 | HR 60 | Resp 18 | Ht 69.0 in

## 2014-08-07 DIAGNOSIS — H578 Other specified disorders of eye and adnexa: Secondary | ICD-10-CM

## 2014-08-07 DIAGNOSIS — R6889 Other general symptoms and signs: Secondary | ICD-10-CM

## 2014-08-07 DIAGNOSIS — R4189 Other symptoms and signs involving cognitive functions and awareness: Secondary | ICD-10-CM

## 2014-08-07 DIAGNOSIS — L299 Pruritus, unspecified: Secondary | ICD-10-CM | POA: Insufficient documentation

## 2014-08-07 DIAGNOSIS — R197 Diarrhea, unspecified: Secondary | ICD-10-CM | POA: Insufficient documentation

## 2014-08-07 DIAGNOSIS — H5789 Other specified disorders of eye and adnexa: Secondary | ICD-10-CM | POA: Insufficient documentation

## 2014-08-07 MED ORDER — ERYTHROMYCIN 5 MG/GM OP OINT: 1.0000 "application " | TOPICAL_OINTMENT | Freq: Four times a day (QID) | OPHTHALMIC | Status: AC

## 2014-08-07 NOTE — Progress Notes (Signed)
Pre visit review using our clinic review tool, if applicable. No additional management support is needed unless otherwise documented below in the visit note. 

## 2014-08-07 NOTE — Assessment & Plan Note (Signed)
Idiopathic - no rashes present. Start benedryl as needed for itching. Return if symptoms worsen or fail to improve.

## 2014-08-07 NOTE — Assessment & Plan Note (Signed)
Symptoms consistent with bacterial conjunctivitis. Start erythromycin ointment x 5 days. Follow up if symptoms worsen or fail to improve.

## 2014-08-07 NOTE — Assessment & Plan Note (Signed)
Start immodium. If no relief provided with a specimen cup to return for stool culture.

## 2014-08-07 NOTE — Patient Instructions (Signed)
Thank you for choosing [facility].  Summary/Instructions:  Your prescription(s) have been submitted to your pharmacy. Please take as directed and contact our office if you believe you are having problem(s) with the medication(s).  If your symptoms worsen or fail to improve, please contact our office for further instruction, or in case of emergency go directly to the emergency room at the closest medical facility.    Eye ointment - use 4 times per day on the right eye for 5 days  Diarrhea - try the immodium over the counter as instructed on the package. If no relief from the mediation will get sample for lab  Itching - 25-50 mg of benedryl as needed for itching.

## 2014-08-07 NOTE — Progress Notes (Signed)
Subjective:    Patient ID: [name], male    DOB: July 09, 1946, 68 y.o.   MRN: [id number]  Chief Complaint  Patient presents with  . Follow-up    Itching x1 week, having alot of BMs    HPI:  [name] is a 68 y.o. male who presents today for follow up. He is present with a caregiver.   1) Diarrhea - has been going on for about 2-3 weeks. Care giver describes that he is eating okay. Has not tried any treatments. There is nothing that makes it better or worse. Denies any melena. Denies any fever or chills. Has not traveled outside the country.   2) Itching - Acute generalized itching has been going on for a couple of weeks. The denies any changes to any lotions, soaps, or medications. Denies any rashes or changes in clothing. States he just feels like he has to itch.   3) Eye discharge - Acute right eye discharge has been going on for a while. Has not done any treatment for it. Describes copious discharge.  Denies any crustiness in the morning or changes in vision.   Allergies  Allergen Reactions  . Meperidine Hcl Other (See Comments)    unknown   Current Outpatient Prescriptions on File Prior to Visit  Medication Sig Dispense Refill  . acetaminophen (TYLENOL) 325 MG tablet Take 2 tablets (650 mg total) by mouth every 6 (six) hours as needed for mild pain (or Fever >/= 101).    Marland Kitchen aspirin EC 81 MG EC tablet Take 1 tablet (81 mg total) by mouth daily.    Marland Kitchen atorvastatin (LIPITOR) 40 MG tablet Take 1 tablet (40 mg total) by mouth daily. 90 tablet 1  . benazepril (LOTENSIN) 20 MG tablet Take 1 tablet (20 mg total) by mouth daily. 90 tablet 1  . carvedilol (COREG) 25 MG tablet Take 1 tablet (25 mg total) by mouth 2 (two) times daily with a meal. 180 tablet 1  . clopidogrel (PLAVIX) 75 MG tablet Take 1 tablet (75 mg total) by mouth daily. 90 tablet 1  . famotidine (PEPCID) 20 MG tablet Take 1 tablet (20 mg total) by mouth daily. 90 tablet 1  . FLUoxetine (PROZAC) 20 MG capsule Take  1 capsule (20 mg total) by mouth daily. 90 capsule 1  . furosemide (LASIX) 20 MG tablet Take 1 tablet (20 mg total) by mouth daily. 90 tablet 1  . spironolactone (ALDACTONE) 25 MG tablet Take 1 tablet (25 mg total) by mouth daily. 90 tablet 1  . valproic acid (DEPAKENE) 250 MG capsule Take 1 capsule (250 mg total) by mouth 2 (two) times daily. 180 capsule 1   No current facility-administered medications on file prior to visit.   Past Medical History  Diagnosis Date  . Coronary artery disease     s/p PCI of LAD with BMS 04/03/06 at [facility]  . Hyperlipidemia   . Hypertension   . Cerebrovascular accident     history of cerebrovascular accident with residual left hemiparesis in 2002, seen at [facility]  . Multiple sclerosis   . Ischemic cardiomyopathy     s/p BiV ICD implanted 2007 at [facility]  . Chronic systolic congestive heart failure   . Bipolar disorder   . Failure to thrive (0-17)    Review of Systems    See HPI  Objective:    BP 110/72 mmHg  Pulse 60  Resp 18  Ht 5\' 9"  (  1.753 m)  Wt   SpO2 97% Nursing note and vital signs reviewed.  Physical Exam  Constitutional: He is oriented to person, place, and time. No distress.  Thin appearing male dressed in sweatpants and a collared shirt with a sweater, appears older than his stated age. Copious drainage apparent of right eye.   Eyes: Right eye exhibits discharge (purulent right eye discharge).  Cardiovascular: Normal rate, regular rhythm, normal heart sounds and intact distal pulses.   Pulmonary/Chest: Effort normal and breath sounds normal.  Abdominal: Soft. Bowel sounds are normal. He exhibits no distension and no mass. There is no tenderness. There is no rebound and no guarding.  Lymphadenopathy:    He has no cervical adenopathy.  Neurological: He is alert and oriented to person, place, and time. He has normal reflexes.  Skin: Skin is warm and dry. No rash noted. No erythema.  Psychiatric:  He has a normal mood and affect. His behavior is normal. Judgment and thought content normal.       Assessment & Plan:

## 2014-08-07 NOTE — Assessment & Plan Note (Signed)
Appears slightly improved as patient is dressed appropriately and interactions are improved. Still remains limited in self-care abilities. Pt still refusing home care and rehabilitation services.

## 2014-08-09 ENCOUNTER — Ambulatory Visit (INDEPENDENT_AMBULATORY_CARE_PROVIDER_SITE_OTHER): Payer: Medicare Other | Admitting: *Deleted

## 2014-08-09 DIAGNOSIS — I255 Ischemic cardiomyopathy: Secondary | ICD-10-CM

## 2014-08-09 LAB — MDC_IDC_ENUM_SESS_TYPE_INCLINIC
Battery Voltage: 2.63 V
Brady Statistic AP VP Percent: 61.51 %
Brady Statistic AP VS Percent: 0.05 %
Brady Statistic RV Percent Paced: 98.82 %
Date Time Interrogation Session: 20151202163324
HIGH POWER IMPEDANCE MEASURED VALUE: 56 Ohm
HighPow Impedance: 41 Ohm
Lead Channel Impedance Value: 418 Ohm
Lead Channel Pacing Threshold Pulse Width: 0.4 ms
Lead Channel Sensing Intrinsic Amplitude: 13.125 mV
Lead Channel Sensing Intrinsic Amplitude: 13.125 mV
Lead Channel Sensing Intrinsic Amplitude: 2 mV
Lead Channel Setting Pacing Amplitude: 2 V
Lead Channel Setting Pacing Amplitude: 2.5 V
Lead Channel Setting Pacing Amplitude: 2.5 V
Lead Channel Setting Pacing Pulse Width: 0.4 ms
MDC IDC MSMT LEADCHNL LV IMPEDANCE VALUE: 266 Ohm
MDC IDC MSMT LEADCHNL LV IMPEDANCE VALUE: 589 Ohm
MDC IDC MSMT LEADCHNL LV IMPEDANCE VALUE: 779 Ohm
MDC IDC MSMT LEADCHNL LV PACING THRESHOLD AMPLITUDE: 1.375 V
MDC IDC MSMT LEADCHNL LV PACING THRESHOLD PULSEWIDTH: 0.8 ms
MDC IDC MSMT LEADCHNL RA PACING THRESHOLD AMPLITUDE: 0.625 V
MDC IDC MSMT LEADCHNL RA PACING THRESHOLD PULSEWIDTH: 0.4 ms
MDC IDC MSMT LEADCHNL RA SENSING INTR AMPL: 2 mV
MDC IDC MSMT LEADCHNL RV IMPEDANCE VALUE: 589 Ohm
MDC IDC MSMT LEADCHNL RV PACING THRESHOLD AMPLITUDE: 0.5 V
MDC IDC SET LEADCHNL LV PACING PULSEWIDTH: 0.8 ms
MDC IDC SET LEADCHNL RV SENSING SENSITIVITY: 0.45 mV
MDC IDC SET ZONE DETECTION INTERVAL: 300 ms
MDC IDC STAT BRADY AS VP PERCENT: 37.31 %
MDC IDC STAT BRADY AS VS PERCENT: 1.12 %
MDC IDC STAT BRADY RA PERCENT PACED: 61.57 %
Zone Setting Detection Interval: 350 ms
Zone Setting Detection Interval: 350 ms
Zone Setting Detection Interval: 400 ms

## 2014-08-09 NOTE — Progress Notes (Signed)
CRT-D check in clinic for batt estimate only (N/C). Batt voltage 2.63V (ERI 2.63V)--ERI has not tripped. No episodes recorded. Patient to follow up via [facility] on 1-4 (billable) and with JA in 12-2014. Alert tones demonstated to patient.

## 2014-08-18 ENCOUNTER — Encounter: Payer: Self-pay | Admitting: Internal Medicine

## 2014-09-25 ENCOUNTER — Encounter: Payer: Medicare Other | Admitting: Internal Medicine

## 2015-01-04 ENCOUNTER — Telehealth: Payer: Self-pay | Admitting: *Deleted

## 2015-01-04 NOTE — Telephone Encounter (Signed)
Caregiver calling asking what will happen if device battery depletes. She has a hard time getting the patient to appointments and doesn't think he can come to office. Explained that the patient needs to come to the office to have battery checked since he cannot locate monitor. Notified that if she calls when she arrives for appt, staff will meet her at the car with a wheelchair for the patient. Scheduled for 11am 01/08/15 for battery check.

## 2015-01-08 ENCOUNTER — Encounter: Payer: Self-pay | Admitting: Internal Medicine

## 2015-01-08 ENCOUNTER — Ambulatory Visit (INDEPENDENT_AMBULATORY_CARE_PROVIDER_SITE_OTHER): Payer: Medicare Other | Admitting: *Deleted

## 2015-01-08 DIAGNOSIS — I519 Heart disease, unspecified: Secondary | ICD-10-CM

## 2015-01-08 DIAGNOSIS — I255 Ischemic cardiomyopathy: Secondary | ICD-10-CM

## 2015-01-09 NOTE — Progress Notes (Signed)
CRT-D device check in office. Thresholds and sensing consistent with previous device measurements. Lead impedance trends stable over time. No mode switch episodes recorded. No ventricular arrhythmia episodes recorded. (36) "VS" episodes--max dur. 2 mins 11 sec, Max V 146---SVT/NSVT noted. Patient bi-ventricularly pacing 89.6% of the time with 1.0% as VSRp. Device programmed with appropriate safety margins. Heart failure diagnostics reviewed and trends have been unstable since 4/23---pt denies ShOB or LE edema. RRT reached on 10/19/2014.  Audible alert for RRT d/c'd this ov. Patient will follow up with JA on 5/9 @ 1:30pm.

## 2015-01-10 LAB — CUP PACEART INCLINIC DEVICE CHECK
Brady Statistic AP VP Percent: 55.3 %
Brady Statistic AP VS Percent: 0.1 % — CL
Brady Statistic AS VP Percent: 43.6 %
Brady Statistic AS VS Percent: 1.1 %
Date Time Interrogation Session: 20160504183252
Lead Channel Impedance Value: 418 Ohm
Lead Channel Pacing Threshold Amplitude: 0.5 V
Lead Channel Pacing Threshold Pulse Width: 0.4 ms
Lead Channel Sensing Intrinsic Amplitude: 13.9 mV
Lead Channel Sensing Intrinsic Amplitude: 3.9 mV
Lead Channel Setting Pacing Amplitude: 2 V
Lead Channel Setting Pacing Amplitude: 3.25 V
Lead Channel Setting Pacing Pulse Width: 0.4 ms
MDC IDC MSMT BATTERY VOLTAGE: 2.6 V
MDC IDC MSMT LEADCHNL LV IMPEDANCE VALUE: 551 Ohm
MDC IDC MSMT LEADCHNL LV PACING THRESHOLD AMPLITUDE: 2.75 V
MDC IDC MSMT LEADCHNL LV PACING THRESHOLD PULSEWIDTH: 0.8 ms
MDC IDC MSMT LEADCHNL RV IMPEDANCE VALUE: 589 Ohm
MDC IDC MSMT LEADCHNL RV PACING THRESHOLD AMPLITUDE: 0.75 V
MDC IDC MSMT LEADCHNL RV PACING THRESHOLD PULSEWIDTH: 0.4 ms
MDC IDC SET LEADCHNL LV PACING PULSEWIDTH: 0.8 ms
MDC IDC SET LEADCHNL RV PACING AMPLITUDE: 2.5 V
MDC IDC SET LEADCHNL RV SENSING SENSITIVITY: 0.45 mV
MDC IDC SET ZONE DETECTION INTERVAL: 350 ms
Zone Setting Detection Interval: 300 ms
Zone Setting Detection Interval: 350 ms
Zone Setting Detection Interval: 400 ms

## 2015-01-15 ENCOUNTER — Ambulatory Visit (INDEPENDENT_AMBULATORY_CARE_PROVIDER_SITE_OTHER): Payer: Medicare Other | Admitting: Internal Medicine

## 2015-01-15 ENCOUNTER — Encounter: Payer: Self-pay | Admitting: Internal Medicine

## 2015-01-15 ENCOUNTER — Encounter: Payer: Self-pay | Admitting: *Deleted

## 2015-01-15 VITALS — BP 126/64 | HR 65 | Ht 68.5 in | Wt 111.8 lb

## 2015-01-15 DIAGNOSIS — I255 Ischemic cardiomyopathy: Secondary | ICD-10-CM | POA: Diagnosis not present

## 2015-01-15 DIAGNOSIS — G35 Multiple sclerosis: Secondary | ICD-10-CM | POA: Diagnosis not present

## 2015-01-15 DIAGNOSIS — Z95810 Presence of automatic (implantable) cardiac defibrillator: Secondary | ICD-10-CM

## 2015-01-15 DIAGNOSIS — I519 Heart disease, unspecified: Secondary | ICD-10-CM | POA: Diagnosis not present

## 2015-01-15 NOTE — Progress Notes (Addendum)
PCP: previously Dr Norins Primary Cardiologist: Previously Dr Tyson  [name] is a 68 y.o. male with a h/o ischemic CM and chronic systolic dysfunction sp BiV ICD implantation (MDT) by Dr Tyson  who presents today for follow-up in the Electrophysiology device clinic.  He is elderly, frail, and chronically ill.  His BiV ICD has reached ERI battery status.   He is s/p PCI of the LAD 2007 at [facility].  He also has chronic systolic dysfunction and underwent BiV ICD implantation 2006.  His EF remains depressed.   He has bipolar disorder which is stable.  He is primarily limited by his Multiple Sclerosis.  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  She says that he spends most of his day in bed and is not ambulatory at this time.  She says that he is functionally very limited.  He admits he has very little interests in life.  Today, he  denies symptoms of palpitations, chest pain, shortness of breath, orthopnea, PND, lower extremity edema, dizziness, presyncope, or syncope.  He has a watery/ itchy right eye chronically as well as difficulty swallowing.  He also has frequent diarrhea.  The patientis tolerating medications without difficulties and is otherwise without complaint today.   Past Medical History  Diagnosis Date  . Coronary artery disease     s/p PCI of LAD with BMS 04/03/06 at [facility]  . Hyperlipidemia   . Hypertension   . Cerebrovascular accident     history of cerebrovascular accident with residual left hemiparesis in 2002, seen at [facility]  . Multiple sclerosis   . Ischemic cardiomyopathy     s/p BiV ICD implanted 2007 at [facility]  . Chronic systolic congestive heart failure   . Bipolar disorder   . Failure to thrive (0-17)    Past Surgical History  Procedure Laterality Date  . Tonsillectomy    . Vasectomy    . Implant of automatic cardioverter defibrillator      December 26, 2004 MDT BiV ICD implanted by Dr Tyson in [place]  . Ptca      PCI LAD with BMS 04/03/06    History   Social History  . Marital Status: Divorced    Spouse Name: N/A  . Number of Children: N/A  . Years of Education: N/A   Occupational History  . Not on file.   Social History Main Topics  . Smoking status: Former Smoker -- 0.50 packs/day for 40 years    Types: Cigarettes    Quit date: 10/13/2011  . Smokeless tobacco: Never Used     Comment: has tried to quit "many times" will continue to try to quit  . Alcohol Use: Yes     Comment: remote heavy ETOH, "years ago"  . Drug Use: No  . Sexual Activity: Not on file   Other Topics Concern  . Not on file   Social History Narrative   HSG. [employer] - 09 months. Married '70's - 10yrs/divorce. Married '80's - 10 yrs/divorced. 1 son - '71, 1 drtr '73. Has grandchildren. Lives alone. On disability.  Lives in [place].    Family History  Problem Relation Age of Onset  . Heart disease Mother   . Kidney disease Father   . COPD Sister   . Cancer Neg Hx   . Diabetes Neg Hx     Allergies  Allergen Reactions  . Meperidine Hcl   Other (See Comments)    unknown    Current Outpatient Prescriptions  Medication Sig Dispense Refill  . acetaminophen (TYLENOL) 325 MG tablet Take 2 tablets (650 mg total) by mouth every 6 (six) hours as needed for mild pain (or Fever >/= 101).    . aspirin EC 81 MG EC tablet Take 1 tablet (81 mg total) by mouth daily.    . atorvastatin (LIPITOR) 40 MG tablet Take 1 tablet (40 mg total) by mouth daily. 90 tablet 1  . benazepril (LOTENSIN) 20 MG tablet Take 1 tablet (20 mg total) by mouth daily. 90 tablet 1  . carvedilol (COREG) 25 MG tablet Take 1 tablet (25 mg total) by mouth 2 (two) times daily with a meal. 180 tablet 1  . clopidogrel (PLAVIX) 75 MG tablet Take 1 tablet (75 mg total) by mouth daily. 90 tablet 1  . erythromycin ophthalmic ointment Place 1 application into the right eye 4 (four) times daily. 3.5  g 0  . FLUoxetine (PROZAC) 20 MG capsule Take 1 capsule (20 mg total) by mouth daily. 90 capsule 1  . furosemide (LASIX) 20 MG tablet Take 1 tablet (20 mg total) by mouth daily. 90 tablet 1  . spironolactone (ALDACTONE) 25 MG tablet Take 1 tablet (25 mg total) by mouth daily. 90 tablet 1  . valproic acid (DEPAKENE) 250 MG capsule Take 1 capsule (250 mg total) by mouth 2 (two) times daily. 180 capsule 1   No current facility-administered medications for this visit.   Physical Exam: Filed Vitals:   01/15/15 1439  BP: 126/64  Pulse: 65  Height: 5' 8.5" (1.74 m)  Weight: 111 lb 12.8 oz (50.712 kg)    GEN- The patient is frail and chronically ill appearing, alert and oriented x 3 today.  dissheveled Head- normocephalic, atraumatic Eyes-  Eye drainage is noted Ears- hearing intact Oropharynx- clear Neck- supple, no JVP Lungs- Clear to ausculation bilaterally, normal work of breathing Chest- BiV ICD is well healed Heart- Regular rate and rhythm, no murmurs, rubs or gallops, PMI not laterally displaced GI- soft, NT, ND, + BS Extremities- no clubbing, cyanosis, trace edema  BiV ICD remote transmission- reviewed in detail today,  See PACEART report  Assessment and Plan:  1. Ischemic CM/ chronic systolic dysfunction The patient has chronic systolic dysfunction He is s/p BiV ICD which has reached ERI.  He has been noncompliant with device follow-up and medical care.  He appears to have very little interest in his health at this time.  Today, I discussed his care at length with the patient and also his caregiver.  I have advised that he consider device downgrade to CRT-P given his overall life preferences.  He is in agreement with this approach.  He has a MDT Fidelis 6949 lead and thus would require lead revision if we proceeded with an ICD.  I would favor down grade to CRT-P with using the current LV lead in the RV port and placing the RV lead in the LV port.  Risks, benefits, and alternatives  to pulse generator replacement were discussed in detail today.  The patient understands that risks include but are not limited to bleeding, infection, pneumothorax, perforation, tamponade, vascular damage, renal failure, MI, stroke, death, inappropriate shocks, damage to his existing leads, and lead dislodgement and wishes to proceed.  We will therefore schedule the procedure at the next available time.  2. Protein- calorie malnutrition- severe Adequate caloric intake is essential  3. Hypotension Stable No change required   today  4. Hypertension Presently hypotensive as above  Return monthly for battery check 

## 2015-01-15 NOTE — Patient Instructions (Signed)
Medication Instructions:  HOLD PLAVIX starting today until after procedure  Labwork: Your physician recommends that you return for lab work in: BMP/CBC   Testing/Procedures: See instruction sheet for procedure  Follow-Up: Your physician recommends that you schedule a follow-up appointment in: 7-10 days from 01/18/15 in device clinic for wound check   Any Other Special Instructions Will Be Listed Below (If Applicable).

## 2015-01-16 ENCOUNTER — Telehealth: Payer: Self-pay | Admitting: Internal Medicine

## 2015-01-16 NOTE — Telephone Encounter (Signed)
New Message     Pt's friend/emergency contact calling stating that she is confused about if the pt needs lab work done before his upcoming surgery. Please call back and advise.

## 2015-01-16 NOTE — Telephone Encounter (Signed)
Labs done yesterday.  They are aware

## 2015-01-16 NOTE — Telephone Encounter (Signed)
Patient did not go to lab as instructed   Wife is [facility] and didn't understand  It was on the AVS and instruction sheet for procedure  I let her know we will get labs at the hospital as it is difficult for her to get him in and out of car

## 2015-01-18 ENCOUNTER — Encounter (HOSPITAL_COMMUNITY)
Admission: RE | Disposition: A | Discharge status: Home / Self Care | Payer: Medicare Other | Source: Ambulatory Visit | Attending: Internal Medicine

## 2015-01-18 ENCOUNTER — Ambulatory Visit (HOSPITAL_COMMUNITY)
Admission: RE | Admit: 2015-01-18 | Discharge: 2015-01-18 | Disposition: A | Discharge status: Home / Self Care | Payer: Medicare Other | Source: Ambulatory Visit | Attending: Internal Medicine | Admitting: Internal Medicine

## 2015-01-18 DIAGNOSIS — I2510 Atherosclerotic heart disease of native coronary artery without angina pectoris: Secondary | ICD-10-CM | POA: Diagnosis not present

## 2015-01-18 DIAGNOSIS — I10 Essential (primary) hypertension: Secondary | ICD-10-CM | POA: Insufficient documentation

## 2015-01-18 DIAGNOSIS — Z7902 Long term (current) use of antithrombotics/antiplatelets: Secondary | ICD-10-CM | POA: Diagnosis not present

## 2015-01-18 DIAGNOSIS — Z87891 Personal history of nicotine dependence: Secondary | ICD-10-CM | POA: Insufficient documentation

## 2015-01-18 DIAGNOSIS — Z7982 Long term (current) use of aspirin: Secondary | ICD-10-CM | POA: Diagnosis not present

## 2015-01-18 DIAGNOSIS — I5042 Chronic combined systolic (congestive) and diastolic (congestive) heart failure: Secondary | ICD-10-CM | POA: Diagnosis present

## 2015-01-18 DIAGNOSIS — I509 Heart failure, unspecified: Secondary | ICD-10-CM | POA: Insufficient documentation

## 2015-01-18 DIAGNOSIS — I447 Left bundle-branch block, unspecified: Secondary | ICD-10-CM | POA: Insufficient documentation

## 2015-01-18 DIAGNOSIS — I519 Heart disease, unspecified: Secondary | ICD-10-CM

## 2015-01-18 DIAGNOSIS — E43 Unspecified severe protein-calorie malnutrition: Secondary | ICD-10-CM | POA: Diagnosis not present

## 2015-01-18 DIAGNOSIS — Z45018 Encounter for adjustment and management of other part of cardiac pacemaker: Secondary | ICD-10-CM | POA: Diagnosis not present

## 2015-01-18 DIAGNOSIS — E785 Hyperlipidemia, unspecified: Secondary | ICD-10-CM | POA: Diagnosis not present

## 2015-01-18 DIAGNOSIS — Z681 Body mass index (BMI) 19 or less, adult: Secondary | ICD-10-CM | POA: Diagnosis not present

## 2015-01-18 DIAGNOSIS — I69854 Hemiplegia and hemiparesis following other cerebrovascular disease affecting left non-dominant side: Secondary | ICD-10-CM | POA: Diagnosis not present

## 2015-01-18 DIAGNOSIS — Z95810 Presence of automatic (implantable) cardiac defibrillator: Secondary | ICD-10-CM

## 2015-01-18 DIAGNOSIS — F319 Bipolar disorder, unspecified: Secondary | ICD-10-CM | POA: Insufficient documentation

## 2015-01-18 DIAGNOSIS — I255 Ischemic cardiomyopathy: Secondary | ICD-10-CM | POA: Diagnosis not present

## 2015-01-18 HISTORY — PX: EP IMPLANTABLE DEVICE: SHX172B

## 2015-01-18 LAB — CBC
HCT: 36.2 % — ABNORMAL LOW (ref 39.0–52.0)
Hemoglobin: 11.5 g/dL — ABNORMAL LOW (ref 13.0–17.0)
MCH: 31.9 pg (ref 26.0–34.0)
MCHC: 31.8 g/dL (ref 30.0–36.0)
MCV: 100.6 fL — AB (ref 78.0–100.0)
Platelets: 197 10*3/uL (ref 150–400)
RBC: 3.6 MIL/uL — ABNORMAL LOW (ref 4.22–5.81)
RDW: 13.3 % (ref 11.5–15.5)
WBC: 7.3 10*3/uL (ref 4.0–10.5)

## 2015-01-18 LAB — BASIC METABOLIC PANEL
Anion gap: 9 (ref 5–15)
BUN: 19 mg/dL (ref 6–20)
CHLORIDE: 106 mmol/L (ref 101–111)
CO2: 25 mmol/L (ref 22–32)
Calcium: 9 mg/dL (ref 8.9–10.3)
Creatinine, Ser: 1.1 mg/dL (ref 0.61–1.24)
GFR calc Af Amer: >60 mL/min (ref >60)
GFR calc non Af Amer: >60 mL/min (ref >60)
Glucose, Bld: 81 mg/dL (ref 65–99)
POTASSIUM: 4.6 mmol/L (ref 3.5–5.1)
Sodium: 140 mmol/L (ref 135–145)

## 2015-01-18 LAB — SURGICAL PCR SCREEN
MRSA, PCR: NEGATIVE
Staphylococcus aureus: NEGATIVE

## 2015-01-18 SURGERY — PPM/BIV PPM GENERATOR CHANGEOUT
Anesthesia: LOCAL

## 2015-01-18 MED ORDER — SODIUM CHLORIDE 0.9 % IJ SOLN: 3.0000 mL | INTRAMUSCULAR | Status: DC | PRN

## 2015-01-18 MED ORDER — LIDOCAINE HCL (PF) 1 % IJ SOLN
INTRAMUSCULAR | Status: AC
  Filled 2015-01-18: qty 60

## 2015-01-18 MED ORDER — DEXTROSE 5 % IV SOLN
2.0000 g | INTRAVENOUS | Status: DC | PRN
  Administered 2015-01-18: 2 g via INTRAVENOUS

## 2015-01-18 MED ORDER — MIDAZOLAM HCL 5 MG/5ML IJ SOLN
INTRAMUSCULAR | Status: AC
  Filled 2015-01-18: qty 5

## 2015-01-18 MED ORDER — MUPIROCIN 2 % EX OINT
TOPICAL_OINTMENT | CUTANEOUS | Status: AC
  Administered 2015-01-18: 1 via TOPICAL
  Filled 2015-01-18: qty 22

## 2015-01-18 MED ORDER — CHLORHEXIDINE GLUCONATE 4 % EX LIQD
60.0000 mL | Freq: Once | CUTANEOUS | Status: DC
  Filled 2015-01-18: qty 60

## 2015-01-18 MED ORDER — ONDANSETRON HCL 4 MG/2ML IJ SOLN: 4.0000 mg | Freq: Four times a day (QID) | INTRAMUSCULAR | Status: DC | PRN

## 2015-01-18 MED ORDER — CEFAZOLIN SODIUM-DEXTROSE 2-3 GM-% IV SOLR
2.0000 g | INTRAVENOUS | Status: DC
  Filled 2015-01-18: qty 50

## 2015-01-18 MED ORDER — ACETAMINOPHEN 325 MG PO TABS
325.0000 mg | ORAL_TABLET | ORAL | Status: DC | PRN
  Filled 2015-01-18: qty 2

## 2015-01-18 MED ORDER — MIDAZOLAM HCL 5 MG/5ML IJ SOLN
INTRAMUSCULAR | Status: DC | PRN
  Administered 2015-01-18: 1 mg via INTRAVENOUS

## 2015-01-18 MED ORDER — SODIUM CHLORIDE 0.9 % IR SOLN
80.0000 mg | Status: DC
  Filled 2015-01-18 (×2): qty 2

## 2015-01-18 MED ORDER — MUPIROCIN 2 % EX OINT
1.0000 "application " | TOPICAL_OINTMENT | Freq: Once | CUTANEOUS | Status: AC
  Administered 2015-01-18: 1 via TOPICAL
  Filled 2015-01-18: qty 22

## 2015-01-18 MED ORDER — CEFAZOLIN SODIUM-DEXTROSE 2-3 GM-% IV SOLR
INTRAVENOUS | Status: AC
  Filled 2015-01-18: qty 50

## 2015-01-18 MED ORDER — SODIUM CHLORIDE 0.9 % IV SOLN
INTRAVENOUS | Status: DC
  Administered 2015-01-18: 13:00:00 via INTRAVENOUS

## 2015-01-18 MED ORDER — SODIUM CHLORIDE 0.9 % IJ SOLN: 3.0000 mL | Freq: Two times a day (BID) | INTRAMUSCULAR | Status: DC

## 2015-01-18 MED ORDER — SODIUM CHLORIDE 0.9 % IV SOLN: 250.0000 mL | INTRAVENOUS | Status: DC | PRN

## 2015-01-18 SURGICAL SUPPLY — 6 items
CABLE SURGICAL S-101-97-12 (CABLE) ×2 IMPLANT
ELECT DEFIB PAD ADLT CADENCE (PAD) ×1 IMPLANT
KIT ESSENTIALS PG (KITS) IMPLANT
PAD DEFIB LIFELINK (PAD) ×2 IMPLANT
PPM CONSULTA CRT-P C4TR01 (Pacemaker) ×1 IMPLANT
TRAY PACEMAKER INSERTION (CUSTOM PROCEDURE TRAY) ×2 IMPLANT

## 2015-01-18 NOTE — H&P (View-Only) (Signed)
PCP: previously Dr Debby Bud Primary Cardiologist: Previously Dr Serena Croissant is a 69 y.o. male with a h/o ischemic CM and chronic systolic dysfunction sp BiV ICD implantation (MDT) by Dr Chales Abrahams  who presents today for follow-up in the Electrophysiology device clinic.  He is elderly, frail, and chronically ill.  His BiV ICD has reached ERI battery status.   He is s/p PCI of the LAD 2007 at [facility].  He also has chronic systolic dysfunction and underwent BiV ICD implantation 2006.  His EF remains depressed.   He has bipolar disorder which is stable.  He is primarily limited by his Multiple Sclerosis.  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  She says that he spends most of his day in bed and is not ambulatory at this time.  She says that he is functionally very limited.  He admits he has very little interests in life.  Today, he  denies symptoms of palpitations, chest pain, shortness of breath, orthopnea, PND, lower extremity edema, dizziness, presyncope, or syncope.  He has a watery/ itchy right eye chronically as well as difficulty swallowing.  He also has frequent diarrhea.  The patientis tolerating medications without difficulties and is otherwise without complaint today.   Past Medical History  Diagnosis Date  . Coronary artery disease     s/p PCI of LAD with BMS 04/03/06 at [facility]  . Hyperlipidemia   . Hypertension   . Cerebrovascular accident     history of cerebrovascular accident with residual left hemiparesis in 2002, seen at [facility]  . Multiple sclerosis   . Ischemic cardiomyopathy     s/p BiV ICD implanted 2007 at [facility]  . Chronic systolic congestive heart failure   . Bipolar disorder   . Failure to thrive (0-17)    Past Surgical History  Procedure Laterality Date  . Tonsillectomy    . Vasectomy    . Implant of automatic cardioverter defibrillator      December 26, 2004 MDT BiV ICD implanted by Dr Chales Abrahams in [facility]  . Ptca      PCI LAD with BMS 04/03/06    History   Social History  . Marital Status: Divorced    Spouse Name: N/A  . Number of Children: N/A  . Years of Education: N/A   Occupational History  . Not on file.   Social History Main Topics  . Smoking status: Former Smoker -- 0.50 packs/day for 40 years    Types: Cigarettes    Quit date: 10/13/2011  . Smokeless tobacco: Never Used     Comment: has tried to quit "many times" will continue to try to quit  . Alcohol Use: Yes     Comment: remote heavy ETOH, "years ago"  . Drug Use: No  . Sexual Activity: Not on file   Other Topics Concern  . Not on file   Social History Narrative   HSG. [employer] - 09 months. Married '70's - 20yrs/divorce. Married '80's - 10 yrs/divorced. 1 son - '71, 1 drtr '73. Has grandchildren. Lives alone. On disability.  Lives in [place].    Family History  Problem Relation Age of Onset  . Heart disease Mother   . Kidney disease Father   . COPD Sister   . Cancer Neg Hx   . Diabetes Neg Hx     Allergies  Allergen Reactions  . Meperidine Hcl  Other (See Comments)    unknown    Current Outpatient Prescriptions  Medication Sig Dispense Refill  . acetaminophen (TYLENOL) 325 MG tablet Take 2 tablets (650 mg total) by mouth every 6 (six) hours as needed for mild pain (or Fever >/= 101).    Marland Kitchen aspirin EC 81 MG EC tablet Take 1 tablet (81 mg total) by mouth daily.    Marland Kitchen atorvastatin (LIPITOR) 40 MG tablet Take 1 tablet (40 mg total) by mouth daily. 90 tablet 1  . benazepril (LOTENSIN) 20 MG tablet Take 1 tablet (20 mg total) by mouth daily. 90 tablet 1  . carvedilol (COREG) 25 MG tablet Take 1 tablet (25 mg total) by mouth 2 (two) times daily with a meal. 180 tablet 1  . clopidogrel (PLAVIX) 75 MG tablet Take 1 tablet (75 mg total) by mouth daily. 90 tablet 1  . erythromycin ophthalmic ointment Place 1 application into the right eye 4 (four) times daily. 3.5  g 0  . FLUoxetine (PROZAC) 20 MG capsule Take 1 capsule (20 mg total) by mouth daily. 90 capsule 1  . furosemide (LASIX) 20 MG tablet Take 1 tablet (20 mg total) by mouth daily. 90 tablet 1  . spironolactone (ALDACTONE) 25 MG tablet Take 1 tablet (25 mg total) by mouth daily. 90 tablet 1  . valproic acid (DEPAKENE) 250 MG capsule Take 1 capsule (250 mg total) by mouth 2 (two) times daily. 180 capsule 1   No current facility-administered medications for this visit.   Physical Exam: Filed Vitals:   01/15/15 1439  BP: 126/64  Pulse: 65  Height: 5' 8.5" (1.74 m)  Weight: 111 lb 12.8 oz (50.712 kg)    GEN- The patient is frail and chronically ill appearing, alert and oriented x 3 today.  dissheveled Head- normocephalic, atraumatic Eyes-  Eye drainage is noted Ears- hearing intact Oropharynx- clear Neck- supple, no JVP Lungs- Clear to ausculation bilaterally, normal work of breathing Chest- BiV ICD is well healed Heart- Regular rate and rhythm, no murmurs, rubs or gallops, PMI not laterally displaced GI- soft, NT, ND, + BS Extremities- no clubbing, cyanosis, trace edema  BiV ICD remote transmission- reviewed in detail today,  See PACEART report  Assessment and Plan:  1. Ischemic CM/ chronic systolic dysfunction The patient has chronic systolic dysfunction He is s/p BiV ICD which has reached ERI.  He has been noncompliant with device follow-up and medical care.  He appears to have very little interest in his health at this time.  Today, I discussed his care at length with the patient and also his caregiver.  I have advised that he consider device downgrade to CRT-P given his overall life preferences.  He is in agreement with this approach.  He has a MDT Fidelis [phone] lead and thus would require lead revision if we proceeded with an ICD.  I would favor down grade to CRT-P with using the current LV lead in the RV port and placing the RV lead in the LV port.  Risks, benefits, and alternatives  to pulse generator replacement were discussed in detail today.  The patient understands that risks include but are not limited to bleeding, infection, pneumothorax, perforation, tamponade, vascular damage, renal failure, MI, stroke, death, inappropriate shocks, damage to his existing leads, and lead dislodgement and wishes to proceed.  We will therefore schedule the procedure at the next available time.  2. Protein- calorie malnutrition- severe Adequate caloric intake is essential  3. Hypotension Stable No change required  today  4. Hypertension Presently hypotensive as above  Return monthly for battery check

## 2015-01-18 NOTE — Discharge Instructions (Signed)
Pacemaker Battery Change, Care After °Refer to this sheet in the next few weeks. These instructions provide you with information on caring for yourself after your procedure. Your health care provider may also give you more specific instructions. Your treatment has been planned according to current medical practices, but problems sometimes occur. Call your health care provider if you have any problems or questions after your procedure. °WHAT TO EXPECT AFTER THE PROCEDURE °After your procedure, it is typical to have the following sensations: °· Soreness at the pacemaker site. °HOME CARE INSTRUCTIONS  °· Keep the incision clean and dry. °· Unless advised otherwise, you may shower beginning 48 hours after your procedure. °· For the first week after the replacement, avoid stretching motions that pull at the incision site, and avoid heavy exercise with the arm that is on the same side as the incision. °· Take medicines only as directed by your health care provider. °· Keep all follow-up visits as directed by your health care provider. °SEEK MEDICAL CARE IF:  °· You have pain at the incision site that is not relieved by over-the-counter or prescription medicine. °· There is drainage or pus from the incision site. °· There is swelling larger than a lime at the incision site. °· You develop red streaking that extends above or below the incision site. °· You feel brief, intermittent palpitations, light-headedness, or any symptoms that you feel might be related to your heart. °SEEK IMMEDIATE MEDICAL CARE IF:  °· You experience chest pain that is different than the pain at the pacemaker site. °· You experience shortness of breath. °· You have palpitations or irregular heartbeat. °· You have light-headedness that does not go away quickly. °· You faint. °· You have pain that gets worse and is not relieved by medicine. °Document Released: 06/15/2013 Document Revised: 01/09/2014 Document Reviewed: 06/15/2013 °ExitCare® Patient  Information ©2015 [employer]. This information is not intended to replace advice given to you by your health care provider. Make sure you discuss any questions you have with your health care provider. ° °

## 2015-01-18 NOTE — Op Note (Signed)
SURGEON:  Hillis Range, MD      PREPROCEDURE DIAGNOSES:   1. Ischemic cardiomyopathy.   2. New York Heart Association class II, heart failure chronically.   3. CAD  4. LBBB      POSTPROCEDURE DIAGNOSES:   1. Ischemic cardiomyopathy.   2. New York Heart Association class II, heart failure chronically.   3. CAD  4. LBBB   PROCEDURES:    1.  CRT-P pulse generator replacement   2. Skin pocket revision     INTRODUCTION:  [name] is a 69 y.o. male an ischemic CM, NYHA Class II CHF, CAD, and prior ICD implantation who presents today for generator change and down grade to CRT-P device.  He is quite frail and chronically ill.  He has had some improvement in quality of life with CRT.  He is felt to be a poor candidate for an ICD going forward.  After careful consideration with patient and care giver, we have elected to perform down grade at this time from CRT-D to CRT-P device for ERI battery status.      DESCRIPTION OF PROCEDURE:  Informed written consent was obtained and the patient was brought to the electrophysiology lab in the fasting state.  The patient received IV Versed 1mg  IV x 1 for sedation..  The patient's left chest was prepped and draped in the usual sterile fashion by the EP lab staff.  The skin overlying the left deltopectoral region was infiltrated with lidocaine for local analgesia.  A 5-cm incision was made over the existing ICD pocket.  Electrocautery was used to assure hemostasis.  The device was exposed and removed from the pocket.  The device was disconnected from the leads.  The leads were examined thoroughly and their integrity confirmed to be intact.   The right atrial lead was confirmed to be a Medtronic model [id number]  (serial # [id number]) lead implanted 12/24/2004.  The right ventricular lead was confirmed to be a Medtronic model [id number] (serial number [id number]) right ventricular defibrillator lead implanted on the same date as the atrial lead.The left ventricular lead was  confirmed to be a Medtronic model [id number] (serial number [id number]) left ventricular lead  implanted on the same date as the atrial lead..   Atrial lead P-waves measured 2 mV with an impedance of 383 ohms and a threshold of 0.6 volts at 0.5 milliseconds.  The right ventricular lead R-wave measured 10 mV with impedance of 434 ohms and a threshold of 0.5 volts at 0.5 milliseconds.  Left ventricular lead impedance was 540 Ohms with a threshold of 2.8V @0 .8 sec in the LV tip to LV ring configuration.  The patient reports chronic intermittent diaphragmatic stimulation.  The RV HV and SVC portions of the lead were capped off and returned to the pocket.  Given his fragility, I did not replace his MDT [phone] lead today.  He would be a poor candidate for lead revisions in the future also.  I had planned to switch the LV/RV lead ports but DID NOT do this today, in order to allow for more LV lead pacing configurations given relatively high RV pacing outputs.  All three leads were then connected to a Medtronic  Model [id number]  (SN [id number]) BiV pacemaker. The pocket was revised to accomodate this new device.   The pocket was  irrigated with copious gentamicin solution.  The pacemaker was placed into the  Pocket.  The pocket was then closed in 2 layers with 2.0 Vicryl suture  for the subcutaneous and subcuticular layers. EBL<77ml. Steri-Strips and a  sterile dressing were then applied.  There were no early apparent complications.     CONCLUSIONS:   1. Ischemic cardiomyopathy with chronic New York Heart Association class II heart failure, CAD, and good response to CRT previously  2. BiV ICD at elective replacement indicator  3. Device replaced with a MDT biventricular pacemaker today.  He has a functional MDT [phone] lead which I did not replace today due to his fragility.  He would be a poor candidate for lead revisions in the future  4. No early apparent complications.   Fayrene Fearing Tannie Koskela,MD 01/18/2015 4:32 PM

## 2015-01-18 NOTE — Interval H&P Note (Signed)
History and Physical Interval Note:  01/18/2015 12:50 PM  [name]  has presented today for surgery, with the diagnosis of ERI  The various methods of treatment have been discussed with the patient and family. After consideration of risks, benefits and other options for treatment, the patient has consented to  Procedure(s): BIV PPM Downgrade (N/A) as a surgical intervention .  The patient's history has been reviewed, patient examined, no change in status, stable for surgery.  I have reviewed the patient's chart and labs.  Questions were answered to the patient's satisfaction.     Hillis Range

## 2015-01-19 ENCOUNTER — Other Ambulatory Visit: Payer: Self-pay | Admitting: Family

## 2015-01-19 ENCOUNTER — Encounter (HOSPITAL_COMMUNITY): Payer: Self-pay | Admitting: Internal Medicine

## 2015-01-19 MED FILL — Lidocaine HCl Local Preservative Free (PF) Inj 1%: INTRAMUSCULAR | Qty: 60 | Status: AC

## 2015-01-25 ENCOUNTER — Ambulatory Visit (INDEPENDENT_AMBULATORY_CARE_PROVIDER_SITE_OTHER): Payer: Medicare Other | Admitting: *Deleted

## 2015-01-25 DIAGNOSIS — I255 Ischemic cardiomyopathy: Secondary | ICD-10-CM | POA: Diagnosis not present

## 2015-01-25 DIAGNOSIS — I519 Heart disease, unspecified: Secondary | ICD-10-CM | POA: Diagnosis not present

## 2015-01-25 DIAGNOSIS — Z45018 Encounter for adjustment and management of other part of cardiac pacemaker: Secondary | ICD-10-CM

## 2015-01-25 LAB — CUP PACEART INCLINIC DEVICE CHECK
Brady Statistic AP VP Percent: 64.19 %
Brady Statistic AP VS Percent: 0.04 %
Brady Statistic AS VP Percent: 24.21 %
Brady Statistic AS VS Percent: 11.56 %
Lead Channel Impedance Value: 361 Ohm
Lead Channel Impedance Value: 361 Ohm
Lead Channel Impedance Value: 399 Ohm
Lead Channel Impedance Value: 494 Ohm
Lead Channel Impedance Value: 608 Ohm
Lead Channel Impedance Value: 779 Ohm
Lead Channel Impedance Value: 779 Ohm
Lead Channel Pacing Threshold Amplitude: 0.5 V
Lead Channel Pacing Threshold Pulse Width: 0.4 ms
Lead Channel Pacing Threshold Pulse Width: 0.4 ms
Lead Channel Sensing Intrinsic Amplitude: 10.875 mV
Lead Channel Sensing Intrinsic Amplitude: 16.125 mV
Lead Channel Setting Pacing Amplitude: 1.5 V
Lead Channel Setting Pacing Amplitude: 2 V
Lead Channel Setting Pacing Amplitude: 2.25 V
Lead Channel Setting Pacing Pulse Width: 0.4 ms
Lead Channel Setting Pacing Pulse Width: 0.8 ms
Lead Channel Setting Sensing Sensitivity: 2 mV
MDC IDC MSMT BATTERY VOLTAGE: 3.08 V
MDC IDC MSMT LEADCHNL LV IMPEDANCE VALUE: 285 Ohm
MDC IDC MSMT LEADCHNL LV IMPEDANCE VALUE: 456 Ohm
MDC IDC MSMT LEADCHNL RA SENSING INTR AMPL: 2.125 mV
MDC IDC MSMT LEADCHNL RA SENSING INTR AMPL: 3.625 mV
MDC IDC MSMT LEADCHNL RV PACING THRESHOLD AMPLITUDE: 0.625 V
MDC IDC SESS DTM: 20160519162015
MDC IDC SET ZONE DETECTION INTERVAL: 400 ms
MDC IDC SET ZONE DETECTION INTERVAL: 400 ms
MDC IDC STAT BRADY RA PERCENT PACED: 64.23 %
MDC IDC STAT BRADY RV PERCENT PACED: 88.4 %

## 2015-01-25 NOTE — Progress Notes (Signed)
Changeout wound check appointment. Steri-strips removed. Wound without redness or edema. Incision edges approximated, wound well healed. Normal device function. Thresholds, sensing, and impedances consistent with implant measurements. Device programmed at chronic lead outputs. Histogram distribution appropriate for patient and level of activity. No mode switches or high ventricular rates noted. Patient educated about wound care, arm mobility, lifting restrictions. ROV w/ JA 05/02/15.

## 2015-01-26 ENCOUNTER — Telehealth: Payer: Self-pay

## 2015-01-26 NOTE — Telephone Encounter (Signed)
We need to have one of the MD's complete the form but that is fine he is appropriate.

## 2015-01-26 NOTE — Telephone Encounter (Signed)
Pt has a handicap parking paper that needs to be signed. Has not been seen since November but it is stated in his chart that he is total self care deficient from the last visit. Please advise on if you need to see pt again in order to feel out paper or not.

## 2015-01-27 ENCOUNTER — Other Ambulatory Visit: Payer: Self-pay | Admitting: Family

## 2015-01-31 ENCOUNTER — Telehealth: Payer: Self-pay | Admitting: Family

## 2015-01-31 NOTE — Telephone Encounter (Signed)
Please call regarding patients handicapped parking. [name] said it has been signed.

## 2015-02-06 ENCOUNTER — Encounter: Payer: Self-pay | Admitting: Internal Medicine

## 2015-05-02 ENCOUNTER — Encounter: Payer: Medicare Other | Admitting: Internal Medicine

## 2015-05-10 ENCOUNTER — Other Ambulatory Visit: Payer: Self-pay | Admitting: Family

## 2015-07-04 ENCOUNTER — Encounter: Payer: Self-pay | Admitting: *Deleted

## 2015-08-04 ENCOUNTER — Other Ambulatory Visit: Payer: Self-pay | Admitting: Family

## 2015-08-15 ENCOUNTER — Other Ambulatory Visit: Payer: Self-pay | Admitting: Family

## 2015-10-22 ENCOUNTER — Encounter (HOSPITAL_COMMUNITY): Payer: Self-pay | Admitting: Emergency Medicine

## 2015-10-22 ENCOUNTER — Inpatient Hospital Stay (HOSPITAL_COMMUNITY)
Admission: EM | Admit: 2015-10-22 | Discharge: 2015-10-29 | DRG: 689 | Disposition: A | Discharge status: Skilled Nursing Facility | Payer: Medicare Other | Attending: Internal Medicine | Admitting: Internal Medicine

## 2015-10-22 ENCOUNTER — Emergency Department (HOSPITAL_COMMUNITY): Payer: Medicare Other

## 2015-10-22 DIAGNOSIS — I69354 Hemiplegia and hemiparesis following cerebral infarction affecting left non-dominant side: Secondary | ICD-10-CM

## 2015-10-22 DIAGNOSIS — D500 Iron deficiency anemia secondary to blood loss (chronic): Secondary | ICD-10-CM | POA: Diagnosis present

## 2015-10-22 DIAGNOSIS — I5042 Chronic combined systolic (congestive) and diastolic (congestive) heart failure: Secondary | ICD-10-CM | POA: Diagnosis present

## 2015-10-22 DIAGNOSIS — I4581 Long QT syndrome: Secondary | ICD-10-CM | POA: Diagnosis present

## 2015-10-22 DIAGNOSIS — E871 Hypo-osmolality and hyponatremia: Secondary | ICD-10-CM | POA: Diagnosis present

## 2015-10-22 DIAGNOSIS — Z9861 Coronary angioplasty status: Secondary | ICD-10-CM | POA: Diagnosis not present

## 2015-10-22 DIAGNOSIS — T17998S Other foreign object in respiratory tract, part unspecified causing other injury, sequela: Secondary | ICD-10-CM | POA: Diagnosis not present

## 2015-10-22 DIAGNOSIS — I248 Other forms of acute ischemic heart disease: Secondary | ICD-10-CM | POA: Diagnosis present

## 2015-10-22 DIAGNOSIS — I2510 Atherosclerotic heart disease of native coronary artery without angina pectoris: Secondary | ICD-10-CM

## 2015-10-22 DIAGNOSIS — I959 Hypotension, unspecified: Secondary | ICD-10-CM | POA: Diagnosis present

## 2015-10-22 DIAGNOSIS — I5043 Acute on chronic combined systolic (congestive) and diastolic (congestive) heart failure: Secondary | ICD-10-CM | POA: Diagnosis present

## 2015-10-22 DIAGNOSIS — F319 Bipolar disorder, unspecified: Secondary | ICD-10-CM | POA: Diagnosis present

## 2015-10-22 DIAGNOSIS — R748 Abnormal levels of other serum enzymes: Secondary | ICD-10-CM | POA: Diagnosis present

## 2015-10-22 DIAGNOSIS — Z95810 Presence of automatic (implantable) cardiac defibrillator: Secondary | ICD-10-CM

## 2015-10-22 DIAGNOSIS — R931 Abnormal findings on diagnostic imaging of heart and coronary circulation: Secondary | ICD-10-CM | POA: Diagnosis not present

## 2015-10-22 DIAGNOSIS — Z79899 Other long term (current) drug therapy: Secondary | ICD-10-CM | POA: Diagnosis not present

## 2015-10-22 DIAGNOSIS — I10 Essential (primary) hypertension: Secondary | ICD-10-CM | POA: Diagnosis present

## 2015-10-22 DIAGNOSIS — J9600 Acute respiratory failure, unspecified whether with hypoxia or hypercapnia: Secondary | ICD-10-CM | POA: Diagnosis present

## 2015-10-22 DIAGNOSIS — Z7902 Long term (current) use of antithrombotics/antiplatelets: Secondary | ICD-10-CM

## 2015-10-22 DIAGNOSIS — N390 Urinary tract infection, site not specified: Secondary | ICD-10-CM | POA: Diagnosis present

## 2015-10-22 DIAGNOSIS — Z8249 Family history of ischemic heart disease and other diseases of the circulatory system: Secondary | ICD-10-CM | POA: Diagnosis not present

## 2015-10-22 DIAGNOSIS — T17908A Unspecified foreign body in respiratory tract, part unspecified causing other injury, initial encounter: Secondary | ICD-10-CM

## 2015-10-22 DIAGNOSIS — Z841 Family history of disorders of kidney and ureter: Secondary | ICD-10-CM | POA: Diagnosis not present

## 2015-10-22 DIAGNOSIS — R1110 Vomiting, unspecified: Secondary | ICD-10-CM

## 2015-10-22 DIAGNOSIS — E43 Unspecified severe protein-calorie malnutrition: Secondary | ICD-10-CM | POA: Diagnosis present

## 2015-10-22 DIAGNOSIS — R531 Weakness: Secondary | ICD-10-CM | POA: Diagnosis present

## 2015-10-22 DIAGNOSIS — E785 Hyperlipidemia, unspecified: Secondary | ICD-10-CM | POA: Diagnosis present

## 2015-10-22 DIAGNOSIS — I5023 Acute on chronic systolic (congestive) heart failure: Secondary | ICD-10-CM | POA: Diagnosis not present

## 2015-10-22 DIAGNOSIS — I340 Nonrheumatic mitral (valve) insufficiency: Secondary | ICD-10-CM | POA: Diagnosis not present

## 2015-10-22 DIAGNOSIS — L8990 Pressure ulcer of unspecified site, unspecified stage: Secondary | ICD-10-CM | POA: Insufficient documentation

## 2015-10-22 DIAGNOSIS — Z87891 Personal history of nicotine dependence: Secondary | ICD-10-CM

## 2015-10-22 DIAGNOSIS — D649 Anemia, unspecified: Secondary | ICD-10-CM | POA: Diagnosis present

## 2015-10-22 DIAGNOSIS — E860 Dehydration: Secondary | ICD-10-CM | POA: Diagnosis present

## 2015-10-22 DIAGNOSIS — Z825 Family history of asthma and other chronic lower respiratory diseases: Secondary | ICD-10-CM | POA: Diagnosis not present

## 2015-10-22 DIAGNOSIS — I513 Intracardiac thrombosis, not elsewhere classified: Secondary | ICD-10-CM | POA: Diagnosis present

## 2015-10-22 DIAGNOSIS — Z888 Allergy status to other drugs, medicaments and biological substances status: Secondary | ICD-10-CM

## 2015-10-22 DIAGNOSIS — G35 Multiple sclerosis: Secondary | ICD-10-CM | POA: Diagnosis present

## 2015-10-22 DIAGNOSIS — I255 Ischemic cardiomyopathy: Secondary | ICD-10-CM | POA: Diagnosis present

## 2015-10-22 DIAGNOSIS — J9601 Acute respiratory failure with hypoxia: Secondary | ICD-10-CM | POA: Diagnosis present

## 2015-10-22 DIAGNOSIS — I240 Acute coronary thrombosis not resulting in myocardial infarction: Secondary | ICD-10-CM | POA: Diagnosis present

## 2015-10-22 DIAGNOSIS — N19 Unspecified kidney failure: Secondary | ICD-10-CM | POA: Diagnosis present

## 2015-10-22 DIAGNOSIS — J690 Pneumonitis due to inhalation of food and vomit: Secondary | ICD-10-CM | POA: Diagnosis present

## 2015-10-22 DIAGNOSIS — R339 Retention of urine, unspecified: Secondary | ICD-10-CM | POA: Diagnosis present

## 2015-10-22 DIAGNOSIS — R627 Adult failure to thrive: Secondary | ICD-10-CM | POA: Diagnosis present

## 2015-10-22 HISTORY — DX: Chronic combined systolic (congestive) and diastolic (congestive) heart failure: I50.42

## 2015-10-22 LAB — URINALYSIS, ROUTINE W REFLEX MICROSCOPIC
GLUCOSE, UA: NEGATIVE mg/dL
Ketones, ur: 15 mg/dL — AB
NITRITE: POSITIVE — AB
PH: 6 (ref 5.0–8.0)
Protein, ur: 100 mg/dL — AB
SPECIFIC GRAVITY, URINE: 1.024 (ref 1.005–1.030)

## 2015-10-22 LAB — COMPREHENSIVE METABOLIC PANEL
ALBUMIN: 2.8 g/dL — AB (ref 3.5–5.0)
ALK PHOS: 53 U/L (ref 38–126)
ALT: 10 U/L — AB (ref 17–63)
AST: 31 U/L (ref 15–41)
Anion gap: 7 (ref 5–15)
BILIRUBIN TOTAL: 0.4 mg/dL (ref 0.3–1.2)
BUN: 38 mg/dL — ABNORMAL HIGH (ref 6–20)
CALCIUM: 8.3 mg/dL — AB (ref 8.9–10.3)
CO2: 25 mmol/L (ref 22–32)
CREATININE: 1.16 mg/dL (ref 0.61–1.24)
Chloride: 101 mmol/L (ref 101–111)
GFR calc non Af Amer: >60 mL/min (ref >60)
GLUCOSE: 114 mg/dL — AB (ref 65–99)
Potassium: 3.7 mmol/L (ref 3.5–5.1)
SODIUM: 133 mmol/L — AB (ref 135–145)
TOTAL PROTEIN: 8.2 g/dL — AB (ref 6.5–8.1)

## 2015-10-22 LAB — DIFFERENTIAL
BASOS ABS: 0 10*3/uL (ref 0.0–0.1)
Basophils Relative: 0 %
Eosinophils Absolute: 0 10*3/uL (ref 0.0–0.7)
Eosinophils Relative: 0 %
LYMPHS ABS: 0.7 10*3/uL (ref 0.7–4.0)
Lymphocytes Relative: 9 %
MONOS PCT: 5 %
Monocytes Absolute: 0.4 10*3/uL (ref 0.1–1.0)
NEUTROS ABS: 6.8 10*3/uL (ref 1.7–7.7)
Neutrophils Relative %: 86 %

## 2015-10-22 LAB — VALPROIC ACID LEVEL: Valproic Acid Lvl: 17 ug/mL — ABNORMAL LOW (ref 50.0–100.0)

## 2015-10-22 LAB — BRAIN NATRIURETIC PEPTIDE: B Natriuretic Peptide: 225.9 pg/mL — ABNORMAL HIGH (ref 0.0–100.0)

## 2015-10-22 LAB — URINE MICROSCOPIC-ADD ON

## 2015-10-22 LAB — LIPASE, BLOOD: Lipase: 44 U/L (ref 11–51)

## 2015-10-22 LAB — CBC
HEMATOCRIT: 30.4 % — AB (ref 39.0–52.0)
HEMOGLOBIN: 9.5 g/dL — AB (ref 13.0–17.0)
MCH: 29.4 pg (ref 26.0–34.0)
MCHC: 31.3 g/dL (ref 30.0–36.0)
MCV: 94.1 fL (ref 78.0–100.0)
Platelets: 243 10*3/uL (ref 150–400)
RBC: 3.23 MIL/uL — ABNORMAL LOW (ref 4.22–5.81)
RDW: 15.9 % — AB (ref 11.5–15.5)
WBC: 7.8 10*3/uL (ref 4.0–10.5)

## 2015-10-22 LAB — CBG MONITORING, ED: Glucose-Capillary: 112 mg/dL — ABNORMAL HIGH (ref 65–99)

## 2015-10-22 LAB — TROPONIN I: Troponin I: 0.05 ng/mL — ABNORMAL HIGH (ref <0.031)

## 2015-10-22 MED ORDER — DEXTROSE 5 % IV SOLN
1.0000 g | Freq: Once | INTRAVENOUS | Status: AC
  Administered 2015-10-22: 1 g via INTRAVENOUS
  Filled 2015-10-22: qty 10

## 2015-10-22 MED ORDER — SODIUM CHLORIDE 0.9 % IV BOLUS (SEPSIS): 250.0000 mL | Freq: Once | INTRAVENOUS | Status: DC

## 2015-10-22 NOTE — ED Provider Notes (Signed)
CSN: [id number]     Arrival date & time 10/22/15  1245 History   First MD Initiated Contact with Patient 10/22/15 1516     Chief Complaint  Patient presents with  . Weakness      Patient is a 70 y.o. male presenting with weakness. The history is provided by the patient and the spouse.  Weakness Associated symptoms include shortness of breath. Pertinent negatives include no chest pain and no abdominal pain.   patient presents with generalized weakness. Began a while ago but his been getting worse recently. Was reportedly been having a large amount of diarrhea. Denies shortness of breath cough. Has had nausea without vomiting. He's had decreased oral intake. States she's been too weak to get out of bed. Patient's wife states he may need placement at the nursing home. States he has been taking his medications. Denies chest pain. Denies dysuria.  Past Medical History  Diagnosis Date  . Coronary artery disease     s/p PCI of LAD with BMS 04/03/06 at [facility]  . Hyperlipidemia   . Hypertension   . Cerebrovascular accident [facility])     history of cerebrovascular accident with residual left hemiparesis in 2002, seen at [facility]  . Multiple sclerosis (HCC)   . Ischemic cardiomyopathy     s/p BiV ICD implanted 2007 at [facility]  . Chronic systolic congestive heart failure (HCC)   . Bipolar disorder (HCC)   . Failure to thrive (0-17)    Past Surgical History  Procedure Laterality Date  . Tonsillectomy    . Vasectomy    . Implant of automatic cardioverter defibrillator      December 26, 2004 MDT BiV ICD implanted by Dr Chales Abrahams in [facility]  . Ptca      PCI LAD with BMS 04/03/06  . Ep implantable device N/A 01/18/2015    Procedure: BIV PPM Downgrade;  Surgeon: Hillis Range, MD;  Location: [facility] INVASIVE CV LAB;  Service: Cardiovascular;  Laterality: N/A;   Family History  Problem Relation Age of Onset  . Heart disease Mother   . Kidney disease Father   . COPD Sister    . Cancer Neg Hx   . Diabetes Neg Hx    Social History  Substance Use Topics  . Smoking status: Former Smoker -- 0.50 packs/day for 40 years    Types: Cigarettes    Quit date: 10/13/2011  . Smokeless tobacco: Never Used     Comment: has tried to quit "many times" will continue to try to quit  . Alcohol Use: Yes     Comment: remote heavy ETOH, "years ago"    Review of Systems  Constitutional: Positive for appetite change and fatigue. Negative for fever.  Respiratory: Positive for shortness of breath. Negative for cough.   Cardiovascular: Negative for chest pain.  Gastrointestinal: Positive for diarrhea. Negative for abdominal pain.  Genitourinary: Negative for dysuria.  Musculoskeletal: Negative for back pain.  Skin: Negative for wound.  Neurological: Positive for weakness.  Psychiatric/Behavioral: Negative for agitation.      Allergies  Meperidine hcl  Home Medications   Prior to Admission medications   Medication Sig Start Date End Date Taking? Authorizing Provider  atorvastatin (LIPITOR) 40 MG tablet TAKE ONE TABLET BY MOUTH ONCE DAILY 08/17/15  Yes Veryl Speak, FNP  benazepril (LOTENSIN) 20 MG tablet TAKE ONE TABLET BY MOUTH ONCE DAILY 08/06/15  Yes Veryl Speak, FNP  carvedilol (COREG) 25 MG tablet TAKE ONE  TABLET BY MOUTH TWICE DAILY WITH MEALS 05/10/15  Yes Veryl Speak, FNP  clopidogrel (PLAVIX) 75 MG tablet TAKE ONE TABLET BY MOUTH ONCE DAILY 08/17/15  Yes Veryl Speak, FNP  famotidine (PEPCID) 20 MG tablet TAKE ONE TABLET BY MOUTH ONCE DAILY 08/06/15  Yes Veryl Speak, FNP  FLUoxetine (PROZAC) 20 MG capsule TAKE ONE CAPSULE BY MOUTH ONCE DAILY 08/06/15  Yes Veryl Speak, FNP  furosemide (LASIX) 20 MG tablet TAKE ONE TABLET BY MOUTH ONCE DAILY 08/17/15  Yes Veryl Speak, FNP  spironolactone (ALDACTONE) 25 MG tablet TAKE ONE TABLET BY MOUTH ONCE DAILY 08/06/15  Yes Veryl Speak, FNP  valproic acid (DEPAKENE) 250 MG capsule TAKE ONE  CAPSULE BY MOUTH TWICE DAILY 01/22/15  Yes Veryl Speak, FNP  erythromycin ophthalmic ointment Place 1 application into the right eye 4 (four) times daily. Patient not taking: Reported on 10/22/2015 08/07/14   Veryl Speak, FNP   BP 128/73 mmHg  Pulse 60  Temp(Src) 99.4 F (37.4 C) (Rectal)  Resp 17  SpO2 96% Physical Exam  Constitutional: He is oriented to person, place, and time. He appears well-developed.  Is cachectic. He is chronically ill appearing.   HENT:  Head: Atraumatic.  Neck: Neck supple.  Cardiovascular: Normal rate.   Pulmonary/Chest:  Diffuse rales.  Abdominal: There is no tenderness.  Musculoskeletal: He exhibits no edema.  Neurological: He is alert and oriented to person, place, and time.  Skin: Skin is warm.  Psychiatric: He has a normal mood and affect.    ED Course  Procedures (including critical care time) Labs Review Labs Reviewed  CBC - Abnormal; Notable for the following:    RBC 3.23 (*)    Hemoglobin 9.5 (*)    HCT 30.4 (*)    RDW 15.9 (*)    All other components within normal limits  URINALYSIS, ROUTINE W REFLEX MICROSCOPIC (NOT AT [facility]) - Abnormal; Notable for the following:    Color, Urine RED (*)    APPearance TURBID (*)    Hgb urine dipstick LARGE (*)    Bilirubin Urine MODERATE (*)    Ketones, ur 15 (*)    Protein, ur 100 (*)    Nitrite POSITIVE (*)    Leukocytes, UA MODERATE (*)    All other components within normal limits  COMPREHENSIVE METABOLIC PANEL - Abnormal; Notable for the following:    Sodium 133 (*)    Glucose, Bld 114 (*)    BUN 38 (*)    Calcium 8.3 (*)    Total Protein 8.2 (*)    Albumin 2.8 (*)    ALT 10 (*)    All other components within normal limits  TROPONIN I - Abnormal; Notable for the following:    Troponin I 0.05 (*)    All other components within normal limits  BRAIN NATRIURETIC PEPTIDE - Abnormal; Notable for the following:    B Natriuretic Peptide 225.9 (*)    All other components within normal  limits  VALPROIC ACID LEVEL - Abnormal; Notable for the following:    Valproic Acid Lvl 17 (*)    All other components within normal limits  URINE MICROSCOPIC-ADD ON - Abnormal; Notable for the following:    Squamous Epithelial / LPF 0-5 (*)    Bacteria, UA MANY (*)    All other components within normal limits  CBG MONITORING, ED - Abnormal; Notable for the following:    Glucose-Capillary 112 (*)    All other  components within normal limits  URINE CULTURE  LIPASE, BLOOD  DIFFERENTIAL    Imaging Review Dg Chest Portable 1 View  10/22/2015  CLINICAL DATA:  Cough, short of breath EXAM: PORTABLE CHEST 1 VIEW COMPARISON:  Radiograph 05/21/1949 FINDINGS: LEFT-sided pacemaker overlies normal cardiac silhouette. There is chronic bronchitic markings centrally. There is fine airspace disease in the LEFT lower lobe and to a lesser degree the RIGHT lower lobe. Mild linear interstitial markings peripherally. IMPRESSION: Mild interstitial edema and pulmonary edema. Electronically Signed   By: Genevive Bi M.D.   On: 10/22/2015 15:53   I have personally reviewed and evaluated these images and lab results as part of my medical decision-making.   EKG Interpretation None      MDM   Final diagnoses:  UTI (lower urinary tract infection)  Adult failure to thrive     patient with failure to thrive. Also found to have urinary tract infection. Admit to internal medicine.       Benjiman Core, MD 10/23/15 0020

## 2015-10-22 NOTE — ED Notes (Signed)
TWO sperate attempts made cath will not advance.

## 2015-10-22 NOTE — ED Notes (Signed)
Pt noted de-sating to 88% RA and started O2 at 2lpm with sats increased to 93%.

## 2015-10-22 NOTE — Progress Notes (Signed)
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

## 2015-10-22 NOTE — ED Notes (Signed)
Bed: [facility] Expected date:  Expected time:  Means of arrival:  Comments: 

## 2015-10-22 NOTE — ED Notes (Signed)
Pt arrived via EMS with report of abd pain, nausea and diarrhea x 1week. Pt denies emesis and fever, urinary frequency/urgency, lower back pain, hematuria but reported foul odor urine.

## 2015-10-22 NOTE — Progress Notes (Signed)
CSW met with patient at bedside. Friend was present. Per note, patient arrived to [facility] via EMS with report of abdominal pain, nausea, and diarrhea x1 a week.  Friend states that she has been staying with patient. However, she states she feels patient needs extra assistance. Friend and patient are interested in facility. Patient and friend state he has been having issues at home with ADL's. Patient does not have a good support besides his friend at bedside. Friend states that patient's family is in [place].  Patient is alert and oriented. Patient states he receives disability as a source of income.  CSW provided patient with a list of ALF for reference. Also, CSW provided patient with information for [facility].  Willette Brace [phone] ED CSW 10/22/2015 7:15 PM

## 2015-10-23 ENCOUNTER — Inpatient Hospital Stay (HOSPITAL_COMMUNITY): Payer: Medicare Other

## 2015-10-23 ENCOUNTER — Encounter (HOSPITAL_COMMUNITY): Payer: Self-pay | Admitting: Family Medicine

## 2015-10-23 ENCOUNTER — Telehealth: Payer: Self-pay | Admitting: Family

## 2015-10-23 DIAGNOSIS — R627 Adult failure to thrive: Secondary | ICD-10-CM

## 2015-10-23 DIAGNOSIS — I340 Nonrheumatic mitral (valve) insufficiency: Secondary | ICD-10-CM

## 2015-10-23 DIAGNOSIS — I5023 Acute on chronic systolic (congestive) heart failure: Secondary | ICD-10-CM | POA: Diagnosis present

## 2015-10-23 DIAGNOSIS — N390 Urinary tract infection, site not specified: Secondary | ICD-10-CM | POA: Diagnosis present

## 2015-10-23 LAB — TROPONIN I
TROPONIN I: 0.04 ng/mL — AB (ref <0.031)
Troponin I: 0.05 ng/mL — ABNORMAL HIGH (ref <0.031)

## 2015-10-23 LAB — PROCALCITONIN: Procalcitonin: 0.32 ng/mL

## 2015-10-23 LAB — PROTIME-INR
INR: 0.96 (ref 0.00–1.49)
PROTHROMBIN TIME: 13 s (ref 11.6–15.2)

## 2015-10-23 LAB — MAGNESIUM: MAGNESIUM: 2 mg/dL (ref 1.7–2.4)

## 2015-10-23 LAB — APTT: aPTT: 34 seconds (ref 24–37)

## 2015-10-23 LAB — LACTIC ACID, PLASMA
LACTIC ACID, VENOUS: 0.9 mmol/L (ref 0.5–2.0)
LACTIC ACID, VENOUS: 1.9 mmol/L (ref 0.5–2.0)
Lactic Acid, Venous: 0.9 mmol/L (ref 0.5–2.0)

## 2015-10-23 MED ORDER — SODIUM CHLORIDE 0.9 % IV SOLN
INTRAVENOUS | Status: AC
  Administered 2015-10-23: 17:00:00 via INTRAVENOUS

## 2015-10-23 MED ORDER — FUROSEMIDE 10 MG/ML IJ SOLN
40.0000 mg | Freq: Once | INTRAMUSCULAR | Status: AC
  Administered 2015-10-23: 40 mg via INTRAVENOUS
  Filled 2015-10-23: qty 4

## 2015-10-23 MED ORDER — SODIUM CHLORIDE 0.9% FLUSH
3.0000 mL | Freq: Two times a day (BID) | INTRAVENOUS | Status: DC
  Administered 2015-10-23 – 2015-10-29 (×8): 3 mL via INTRAVENOUS

## 2015-10-23 MED ORDER — VALPROIC ACID 250 MG PO CAPS
250.0000 mg | ORAL_CAPSULE | Freq: Two times a day (BID) | ORAL | Status: DC
  Administered 2015-10-23 – 2015-10-29 (×13): 250 mg via ORAL
  Filled 2015-10-23 (×20): qty 1

## 2015-10-23 MED ORDER — FLUOXETINE HCL 20 MG PO CAPS
20.0000 mg | ORAL_CAPSULE | Freq: Every day | ORAL | Status: DC
  Administered 2015-10-23 – 2015-10-29 (×7): 20 mg via ORAL
  Filled 2015-10-23 (×7): qty 1

## 2015-10-23 MED ORDER — POTASSIUM CHLORIDE CRYS ER 20 MEQ PO TBCR
40.0000 meq | EXTENDED_RELEASE_TABLET | Freq: Once | ORAL | Status: AC
  Administered 2015-10-23: 40 meq via ORAL
  Filled 2015-10-23: qty 2

## 2015-10-23 MED ORDER — CEFTRIAXONE SODIUM 1 G IJ SOLR
1.0000 g | Freq: Every day | INTRAMUSCULAR | Status: DC
  Administered 2015-10-23 – 2015-10-24 (×2): 1 g via INTRAVENOUS
  Filled 2015-10-23 (×2): qty 10

## 2015-10-23 MED ORDER — ACETAMINOPHEN 325 MG PO TABS
650.0000 mg | ORAL_TABLET | ORAL | Status: DC | PRN
Start: 2015-10-23 — End: 2015-10-29
  Administered 2015-10-24 – 2015-10-29 (×3): 650 mg via ORAL
  Filled 2015-10-23 (×4): qty 2

## 2015-10-23 MED ORDER — CLOPIDOGREL BISULFATE 75 MG PO TABS
75.0000 mg | ORAL_TABLET | Freq: Every day | ORAL | Status: DC
  Administered 2015-10-23 – 2015-10-24 (×2): 75 mg via ORAL
  Filled 2015-10-23 (×4): qty 1

## 2015-10-23 MED ORDER — SODIUM CHLORIDE 0.9 % IV SOLN: 250.0000 mL | INTRAVENOUS | Status: DC | PRN

## 2015-10-23 MED ORDER — SPIRONOLACTONE 25 MG PO TABS
25.0000 mg | ORAL_TABLET | Freq: Every day | ORAL | Status: DC
  Filled 2015-10-23: qty 1

## 2015-10-23 MED ORDER — BENAZEPRIL HCL 10 MG PO TABS
20.0000 mg | ORAL_TABLET | Freq: Every day | ORAL | Status: DC
Start: 2015-10-23 — End: 2015-10-24
  Administered 2015-10-23 – 2015-10-24 (×2): 20 mg via ORAL
  Filled 2015-10-23: qty 1
  Filled 2015-10-23 (×2): qty 2

## 2015-10-23 MED ORDER — FUROSEMIDE 40 MG PO TABS: 20.0000 mg | ORAL_TABLET | Freq: Every day | ORAL | Status: DC

## 2015-10-23 MED ORDER — ATORVASTATIN CALCIUM 40 MG PO TABS
40.0000 mg | ORAL_TABLET | Freq: Every day | ORAL | Status: DC
  Administered 2015-10-23 – 2015-10-28 (×6): 40 mg via ORAL
  Filled 2015-10-23 (×8): qty 1

## 2015-10-23 MED ORDER — ACETAMINOPHEN 325 MG PO TABS: 650.0000 mg | ORAL_TABLET | ORAL | Status: DC | PRN

## 2015-10-23 MED ORDER — FAMOTIDINE 20 MG PO TABS
20.0000 mg | ORAL_TABLET | Freq: Every day | ORAL | Status: DC
  Administered 2015-10-23 – 2015-10-29 (×7): 20 mg via ORAL
  Filled 2015-10-23 (×7): qty 1

## 2015-10-23 MED ORDER — ONDANSETRON HCL 4 MG/2ML IJ SOLN: 4.0000 mg | Freq: Four times a day (QID) | INTRAMUSCULAR | Status: DC | PRN

## 2015-10-23 MED ORDER — DEXTROSE 5 % IV SOLN: 2.0000 g | Freq: Once | INTRAVENOUS | Status: DC

## 2015-10-23 MED ORDER — SODIUM CHLORIDE 0.9% FLUSH
3.0000 mL | INTRAVENOUS | Status: DC | PRN
  Administered 2015-10-29: 3 mL via INTRAVENOUS
  Filled 2015-10-23: qty 3

## 2015-10-23 MED ORDER — CARVEDILOL 12.5 MG PO TABS
25.0000 mg | ORAL_TABLET | Freq: Two times a day (BID) | ORAL | Status: DC
  Administered 2015-10-23 – 2015-10-24 (×4): 25 mg via ORAL
  Filled 2015-10-23 (×7): qty 1

## 2015-10-23 MED ORDER — ENOXAPARIN SODIUM 40 MG/0.4ML ~~LOC~~ SOLN
40.0000 mg | Freq: Every day | SUBCUTANEOUS | Status: DC
  Administered 2015-10-23 (×2): 40 mg via SUBCUTANEOUS
  Filled 2015-10-23 (×3): qty 0.4

## 2015-10-23 MED ORDER — DEXTROSE 5 % IV SOLN
1.0000 g | Freq: Once | INTRAVENOUS | Status: AC
  Administered 2015-10-23: 1 g via INTRAVENOUS
  Filled 2015-10-23: qty 10

## 2015-10-23 NOTE — Telephone Encounter (Signed)
[name] called wants to speak to the assistant concern about [name]. Pt state he is in the ER right now and she not sure what going to happen after he gets out of the ER. She stated she is not sure if she will be able to take care of him alone after he gets out. Please give her a call, she will give your more information.   [phone]

## 2015-10-23 NOTE — Progress Notes (Addendum)
PROGRESS NOTE    DOCTOR COBAS  KKO:[id number]  DOB: September 06, 1946  DOA: 10/22/2015 PCP: Jeanine Luz, FNP Outpatient Specialists:   Hospital course: 70 year old male with history of CAD, status post PCI of LAD with BMS 04/03/06 at [facility], ischemic cardiomyopathy status post BiV ICD, chronic systolic CHF, HLD, HTN, CVA, multiple sclerosis, bipolar disorder and failure to thrive, presented to [facility] ED on 10/22/15 with history of progressive declining health over several months with weight loss and weakness and one-week history of abdominal pain, nausea, dyspnea on minimal exertion and weakness to a point where he was too weak to get out of bed. Per EDP, family no longer able to adequately care for him and requesting nursing home placement. In the ED, patient hypoxic in the mid 80s on room air, EKG showed paced rhythm and QTC 644 ms, chest x-ray demonstrated mild interstitial and pulmonary edema, urine microscopy suggestive of UTI. He was admitted for further evaluation and management of possible UTI and adult failure to thrive.  Assessment & Plan:   1. Possible UTI complicating urinary retention: Foley catheter placed in ED. Started empirically on IV Rocephin-continue same pending culture sensitivity results. 2. Dehydration: Although chest x-ray was reported as mild interstitial and pulmonary edema, patient clinically appears dehydrated. Did receive a dose of Lasix in the ED. Discontinue further diuretics for now. Brief and gentle IV fluids and encouraged oral intake. 3. Chronic systolic CHF/ischemic cardiomyopathy/CAD/ICD: 2-D echo 2014: LVEF 20% with diffuse hypokinesis. Patient asymptomatic of chest pain or dyspnea. Minimally elevated troponin of unclear significance and may be from demand ischemia. Monitor on telemetry. Cycle troponins. Repeat 2-D echo. Consider resuming diuretics when adequately hydrated. Continue Plavix, statins, ACEI and  carvedilol. 4. Essential hypertension: Controlled. 5. Hyperlipidemia: Continue statins. 6. CVA: Continue Plavix. 7. Bipolar disorder: Continue fluoxetine and valproate. Patient appears pleasantly confused-unclear baseline. 8. Anemia: Follow CBCs. 9. Prolonged QTC: Keep potassium >4 and magnesium >2. Monitor on telemetry. Avoid QTC prolonging medications. Reviewing prior EKGs-always has had QTC prolongation-unsure if truly prolonged d/t BBB morphology 10. Adult failure to thrive: Probably multifactorial secondary to UTI, dehydration complicating multiple underlying severe comorbidities and age.  DVT prophylaxis: Lovenox Code Status: Full Family Communication: None at bedside Disposition Plan: To be determined. ? SNF when stable.   Consultants:  None  Procedures:  Foley catheter 2/13 >  Antimicrobials:  IV Rocephin 2/13 >  Subjective: Pleasantly confused. Denies complaints. Denies pain. As per ED RN, 600 mL of urine emptied after Foley catheter placed.  Objective: Filed Vitals:   10/23/15 0800 10/23/15 0900 10/23/15 1054 10/23/15 1339  BP: 122/78 116/75 112/70 97/63  Pulse: 65 65 70 68  Temp:   97.6 F (36.4 C) 97.8 F (36.6 C)  TempSrc:   Axillary Oral  Resp: 20 17 16 16   Height:   5\' 9"  (1.753 m)   Weight:   46.4 kg (102 lb 4.7 oz)   SpO2: 97% 92% 95% 93%    Intake/Output Summary (Last 24 hours) at 10/23/15 1510 Last data filed at 10/23/15 1341  Gross per 24 hour  Intake    120 ml  Output   1225 ml  Net  -1105 ml   Filed Weights   10/23/15 1054  Weight: 46.4 kg (102 lb 4.7 oz)    Exam:  General exam: Moderately built and poorly nourished, middle-aged chronically ill-looking male, unkempt/disheveled, lying comfortably propped up in bed. Oral mucosa dry.  Respiratory system: Clear. No increased work  of breathing. Cardiovascular system: S1 & S2 heard, RRR. No JVD, murmurs, gallops, clicks or pedal edema. Gastrointestinal system: Abdomen is nondistended, soft  and nontender. Normal bowel sounds heard.Foley catheter +.  Central nervous system: Alert and oriented only to self. No focal neurological deficits. Extremities: Symmetric 5 x 5 power.Couple of superficial abrasions on her legs.  Data Reviewed: Basic Metabolic Panel:  Recent Labs Lab 10/22/15 1622  NA 133*  K 3.7  CL 101  CO2 25  GLUCOSE 114*  BUN 38*  CREATININE 1.16  CALCIUM 8.3*   Liver Function Tests:  Recent Labs Lab 10/22/15 1622  AST 31  ALT 10*  ALKPHOS 53  BILITOT 0.4  PROT 8.2*  ALBUMIN 2.8*    Recent Labs Lab 10/22/15 1622  LIPASE 44   No results for input(s): AMMONIA in the last 168 hours. CBC:  Recent Labs Lab 10/22/15 1446  WBC 7.8  NEUTROABS 6.8  HGB 9.5*  HCT 30.4*  MCV 94.1  PLT 243   Cardiac Enzymes:  Recent Labs Lab 10/22/15 1622 10/23/15 1024  TROPONINI 0.05* 0.05*   BNP (last 3 results) No results for input(s): PROBNP in the last 8760 hours. CBG:  Recent Labs Lab 10/22/15 1534  GLUCAP 112*    No results found for this or any previous visit (from the past 240 hour(s)).       Studies: Dg Chest Portable 1 View  10/22/2015  CLINICAL DATA:  Cough, short of breath EXAM: PORTABLE CHEST 1 VIEW COMPARISON:  Radiograph 05/21/1949 FINDINGS: LEFT-sided pacemaker overlies normal cardiac silhouette. There is chronic bronchitic markings centrally. There is fine airspace disease in the LEFT lower lobe and to a lesser degree the RIGHT lower lobe. Mild linear interstitial markings peripherally. IMPRESSION: Mild interstitial edema and pulmonary edema. Electronically Signed   By: Genevive Bi M.D.   On: 10/22/2015 15:53        Scheduled Meds: . atorvastatin  40 mg Oral Daily  . benazepril  20 mg Oral Daily  . carvedilol  25 mg Oral BID WC  . cefTRIAXone (ROCEPHIN)  IV  1 g Intravenous QHS  . clopidogrel  75 mg Oral Daily  . enoxaparin (LOVENOX) injection  40 mg Subcutaneous QHS  . famotidine  20 mg Oral Daily  .  FLUoxetine  20 mg Oral Daily  . sodium chloride flush  3 mL Intravenous Q12H  . valproic acid  250 mg Oral BID   Continuous Infusions:    Principal Problem:   Acute respiratory failure (HCC) Active Problems:   Essential hypertension   Protein-calorie malnutrition, severe (HCC)   UTI (urinary tract infection)   Acute on chronic systolic (congestive) heart failure (HCC)   Failure to thrive in adult   Complicated UTI (urinary tract infection)    Time spent: 40 minutes.    Marcellus Scott, MD, FACP, FHM. [facility] Pager [phone]  If 7PM-7AM, please contact night-coverage [email or web address] Password [email or web address] 10/23/2015, 3:10 PM    LOS: 1 day

## 2015-10-23 NOTE — Progress Notes (Signed)
*  PRELIMINARY RESULTS* Echocardiogram 2D Echocardiogram has been performed.  Jeryl Columbia 10/23/2015, 3:30 PM

## 2015-10-23 NOTE — Progress Notes (Signed)
Pharmacy: Re- ceftriaxone  Patient's a 70 y.o M admitted to the ED on 2/13 with c/o abd pain, nausea and malaise.  Ceftriaxone started on admission for suspected UTI.  2/13 >>rocephin  >>  2/13 ucx: 2/13 UA: moderate leukocytes, nitrite (+)   Plan: - continue ceftriaxone 1gm IV q24h - pharmacy will sign off since current dose is appropriate for indication and no renal adjustment is needed with this abx. - re-consult [place] if need further assistance  Thank you for asking pharmacy to participate in this patient's care.  Dorna Leitz, PharmD, BCPS 10/23/2015 11:19 AM

## 2015-10-23 NOTE — H&P (Signed)
[facility] History and Physical  [name] VQQ:[id number] DOB: Aug 22, 1946 DOA: 10/22/2015  Referring physician: ED physician PCP: Kenneth Luz, FNP  Specialists:  Dr. Johney Reese (cardiology)   Chief Complaint:  Abdominal pain, nausea, malaise  HPI: [name] is a 70 y.o. male with PMH of ischemic cardiomyopathy with stents and pacer placement in 2007, hypertension, and remote CVA who presents to the ED with 1 week of abdominal pain, nausea, generalized weakness, and shortness of breath. Patient has apparently been declining in health over several months with weight loss and progressive weakness. Approximately one week ago, patient developed abdominal pain with nausea, and also dyspnea with minimal exertion around the same time. Over the past week, symptoms have continued to progress until the day of admission with the patient was too weak to get out of bed.  Per report of the ED physician, patient's family is no longer able to take adequate care of [name] and they are therefore seeking placement in a nursing facility.  In ED, patient was found to be afebrile, desaturating to the mid 80s on room air, and with remaining vital signs stable. EKG featured a paced rhythm with QTc prolonged to 644 ms. Chest x-ray demonstrates mild interstitial and pulmonary edema. CMP features a mild hyponatremia, elevated BUN, and albumin of 2.8. CBC is notable for a normocytic anemia and remaining blood work remarkable for BNP of 226 and troponin of 0.05. Urinalysis features many bacteria, moderate leukocytes, and positive nitrite. Urine culture was obtained in the emergency department, a 250 cc NS bolus was administered, and an empiric dose of Rocephin was given. Patient remained hemodynamically stable in the emergency department and will be admitted to the hospital for ongoing evaluation and management of UTI with acute on chronic systolic CHF on a background of adult failure to thrive.  Where does patient  live?   At home    Can patient participate in ADLs?  Some   Review of Systems:  Unable to obtain ROS secondary to patient's clinical condition with confusion.   Allergy:  Allergies  Allergen Reactions  . Meperidine Hcl Other (See Comments)    unknown    Past Medical History  Diagnosis Date  . Coronary artery disease     s/p PCI of LAD with BMS 04/03/06 at [facility]  . Hyperlipidemia   . Hypertension   . Cerebrovascular accident [facility]     history of cerebrovascular accident with residual left hemiparesis in 2002, seen at [facility]  . Multiple sclerosis (HCC)   . Ischemic cardiomyopathy     s/p BiV ICD implanted 2007 at [facility]  . Chronic systolic congestive heart failure (HCC)   . Bipolar disorder (HCC)   . Failure to thrive (0-17)     Past Surgical History  Procedure Laterality Date  . Tonsillectomy    . Vasectomy    . Implant of automatic cardioverter defibrillator      December 26, 2004 MDT BiV ICD implanted by Dr [name] in [facility]  . [facility]ca      PCI LAD with BMS 04/03/06  . Ep implantable device N/A 01/18/2015    Procedure: BIV PPM Downgrade;  Surgeon: Kenneth Range, MD;  Location: [facility] INVASIVE CV LAB;  Service: Cardiovascular;  Laterality: N/A;    Social History:  reports that he quit smoking about 4 years ago. His smoking use included Cigarettes. He has a 20 pack-year smoking history. He has never used smokeless tobacco. He reports  that he drinks alcohol. He reports that he does not use illicit drugs.  Family History:  Family History  Problem Relation Age of Onset  . Heart disease Mother   . Kidney disease Father   . COPD Sister   . Cancer Neg Hx   . Diabetes Neg Hx      Prior to Admission medications   Medication Sig Start Date End Date Taking? Authorizing Provider  atorvastatin (LIPITOR) 40 MG tablet TAKE ONE TABLET BY MOUTH ONCE DAILY 08/17/15  Yes Kenneth Speak, FNP  benazepril (LOTENSIN) 20 MG tablet TAKE ONE TABLET BY MOUTH  ONCE DAILY 08/06/15  Yes Kenneth Speak, FNP  carvedilol (COREG) 25 MG tablet TAKE ONE TABLET BY MOUTH TWICE DAILY WITH MEALS 05/10/15  Yes Kenneth Speak, FNP  clopidogrel (PLAVIX) 75 MG tablet TAKE ONE TABLET BY MOUTH ONCE DAILY 08/17/15  Yes Kenneth Speak, FNP  famotidine (PEPCID) 20 MG tablet TAKE ONE TABLET BY MOUTH ONCE DAILY 08/06/15  Yes Kenneth Speak, FNP  FLUoxetine (PROZAC) 20 MG capsule TAKE ONE CAPSULE BY MOUTH ONCE DAILY 08/06/15  Yes Kenneth Speak, FNP  furosemide (LASIX) 20 MG tablet TAKE ONE TABLET BY MOUTH ONCE DAILY 08/17/15  Yes Kenneth Speak, FNP  spironolactone (ALDACTONE) 25 MG tablet TAKE ONE TABLET BY MOUTH ONCE DAILY 08/06/15  Yes Kenneth Speak, FNP  valproic acid (DEPAKENE) 250 MG capsule TAKE ONE CAPSULE BY MOUTH TWICE DAILY 01/22/15  Yes Kenneth Speak, FNP  erythromycin ophthalmic ointment Place 1 application into the right eye 4 (four) times daily. Patient not taking: Reported on 10/22/2015 08/07/14   Kenneth Speak, FNP    Physical Exam: Filed Vitals:   10/22/15 2045 10/22/15 2100 10/22/15 2200 10/22/15 2300  BP: 126/81 124/76 116/70 128/73  Pulse: 67 62 63 60  Temp:      TempSrc:      Resp: SpO2: 96% 95% 98% 96%   General: Not in acute distress. Cachectic, disheveled HEENT:       Eyes: PERRL, EOMI, no scleral icterus or conjunctival pallor.       ENT: No discharge from the ears or nose, no pharyngeal ulcers, petechiae or exudate, no tonsillar enlargement.        Neck: No JVD, no bruit, no appreciable mass Heme: No cervical adenopathy, no pallor Cardiac: S1/S2, RRR, grade III holosystolic murmur at lower LSB, No gallops or rubs. Pulm: Good air movement bilaterally. No rales, wheezing, rhonchi or rubs. Abd: Soft, nondistended, mild suprapubic tenderness, no rebound pain or gaurding, no mass or organomegaly, BS present. Ext: No LE edema bilaterally. 1+DP/PT pulse bilaterally. Musculoskeletal: No gross deformity, no red, hot,  swollen joints   Skin: No rashes or wounds on exposed surfaces  Neuro: Alert, unintelligible speech, cranial nerves II-XII grossly intact, sensation to light touch intact. No focal findings Psych: Patient is not overtly psychotic, though exam difficult secondary to AMS.  Labs on Admission:  Basic Metabolic Panel:  Recent Labs Lab 10/22/15 1622  NA 133*  K 3.7  CL 101  CO2 25  GLUCOSE 114*  BUN 38*  CREATININE 1.16  CALCIUM 8.3*   Liver Function Tests:  Recent Labs Lab 10/22/15 1622  AST 31  ALT 10*  ALKPHOS 53  BILITOT 0.4  PROT 8.2*  ALBUMIN 2.8*    Recent Labs Lab 10/22/15 1622  LIPASE 44   No results for input(s): AMMONIA in the last 168 hours. CBC:  Recent  Labs Lab 10/22/15 1446  WBC 7.8  NEUTROABS 6.8  HGB 9.5*  HCT 30.4*  MCV 94.1  PLT 243   Cardiac Enzymes:  Recent Labs Lab 10/22/15 1622  TROPONINI 0.05*    BNP (last 3 results)  Recent Labs  10/22/15 1622  BNP 225.9*    ProBNP (last 3 results) No results for input(s): PROBNP in the last 8760 hours.  CBG:  Recent Labs Lab 10/22/15 1534  GLUCAP 112*    Radiological Exams on Admission: Dg Chest Portable 1 View  10/22/2015  CLINICAL DATA:  Cough, short of breath EXAM: PORTABLE CHEST 1 VIEW COMPARISON:  Radiograph 05/21/1949 FINDINGS: LEFT-sided pacemaker overlies normal cardiac silhouette. There is chronic bronchitic markings centrally. There is fine airspace disease in the LEFT lower lobe and to a lesser degree the RIGHT lower lobe. Mild linear interstitial markings peripherally. IMPRESSION: Mild interstitial edema and pulmonary edema. Electronically Signed   By: Genevive Bi M.D.   On: 10/22/2015 15:53    EKG: Independently reviewed.  Abnormal findings:  QTc 644, paced rhythm   Assessment/Plan  1. UTI  - UA grossly positive, there is suprapubic tenderness on exam  - Urine culture sent, will follow-up  - Empiric Rocephin started in the ED, will continue while awaiting  culture data    2. Acute on chronic systolic CHF with acute hypoxic respiratory failure  - No home supplemental O2 requirement, but now dropping to 80s on rm air, recovers with supplemental O2  - TTE (08/07/13) EF 20% with diffuse hypokinesis  - Will likely need diuresis once acute infection improving, but will hold off for now given concern for developing sepsis  - Continue Continue Coreg, Plavix, Lipitor, spironolactone, benazepril  - Will likely resume Lasix tomorrow  - Strict I/Os, SLIV, daily wts, fluid-restricted diet    3. Adult failure to thrive, protein-calorie malnutrition - Poor appetite with progressive wt loss and functional decline  - Cachectic appearance with albumin 2.8  - Dietary consultation requested   4. Hypertension  - At goal currently  - Continue home regimen with benazepril, Coreg, spironolactone, Lasix  - monitor, adjust meds prn     DVT ppx:   SQ Lovenox    Code Status: Full code Family Communication: None at bed side.         Disposition Plan: Admit to inpatient   Date of Service 10/23/2015    Briscoe Deutscher, MD [facility] Pager [phone]  If 7PM-7AM, please contact night-coverage [email or web address] Password [email or web address] 10/23/2015, 1:48 AM

## 2015-10-23 NOTE — Progress Notes (Signed)
Pharmacy Antibiotic Note  [name] is a 70 y.o. male admitted on 10/22/2015 with UTI  Pharmacy has been consulted for Rocephin dosing.  Plan: Rocephin 2Gm x1 then 1Gm IV q24h     Temp (24hrs), Avg:99.3 F (37.4 C), Min:99.1 F (37.3 C), Max:99.4 F (37.4 C)   Recent Labs Lab 10/22/15 1446 10/22/15 1622  WBC 7.8  --   CREATININE  --  1.16    CrCl cannot be calculated (Unknown ideal weight.).    Allergies  Allergen Reactions  . Meperidine Hcl Other (See Comments)    unknown    Antimicrobials this admission: 2/13 rocephin >>    Dose adjustments this admission:   Microbiology results:  BCx:   UCx:    Sputum:    MRSA PCR:   Thank you for allowing pharmacy to be a part of this patient's care.  Lorenza Evangelist 10/23/2015 2:02 AM

## 2015-10-24 ENCOUNTER — Inpatient Hospital Stay (HOSPITAL_COMMUNITY): Payer: Medicare Other

## 2015-10-24 ENCOUNTER — Encounter (HOSPITAL_COMMUNITY): Payer: Self-pay | Admitting: Cardiology

## 2015-10-24 DIAGNOSIS — I255 Ischemic cardiomyopathy: Secondary | ICD-10-CM

## 2015-10-24 DIAGNOSIS — Z9861 Coronary angioplasty status: Secondary | ICD-10-CM

## 2015-10-24 DIAGNOSIS — N390 Urinary tract infection, site not specified: Principal | ICD-10-CM

## 2015-10-24 DIAGNOSIS — I959 Hypotension, unspecified: Secondary | ICD-10-CM

## 2015-10-24 DIAGNOSIS — I10 Essential (primary) hypertension: Secondary | ICD-10-CM

## 2015-10-24 DIAGNOSIS — R627 Adult failure to thrive: Secondary | ICD-10-CM

## 2015-10-24 DIAGNOSIS — R931 Abnormal findings on diagnostic imaging of heart and coronary circulation: Secondary | ICD-10-CM | POA: Diagnosis present

## 2015-10-24 DIAGNOSIS — I5042 Chronic combined systolic (congestive) and diastolic (congestive) heart failure: Secondary | ICD-10-CM

## 2015-10-24 DIAGNOSIS — I2510 Atherosclerotic heart disease of native coronary artery without angina pectoris: Secondary | ICD-10-CM

## 2015-10-24 LAB — BASIC METABOLIC PANEL
Anion gap: 9 (ref 5–15)
BUN: 21 mg/dL — AB (ref 6–20)
CHLORIDE: 101 mmol/L (ref 101–111)
CO2: 23 mmol/L (ref 22–32)
CREATININE: 0.92 mg/dL (ref 0.61–1.24)
Calcium: 8.5 mg/dL — ABNORMAL LOW (ref 8.9–10.3)
GFR calc Af Amer: >60 mL/min (ref >60)
GFR calc non Af Amer: >60 mL/min (ref >60)
GLUCOSE: 107 mg/dL — AB (ref 65–99)
POTASSIUM: 4.8 mmol/L (ref 3.5–5.1)
SODIUM: 133 mmol/L — AB (ref 135–145)

## 2015-10-24 LAB — CBC
HCT: 27.7 % — ABNORMAL LOW (ref 39.0–52.0)
HEMOGLOBIN: 8.5 g/dL — AB (ref 13.0–17.0)
MCH: 29.3 pg (ref 26.0–34.0)
MCHC: 30.7 g/dL (ref 30.0–36.0)
MCV: 95.5 fL (ref 78.0–100.0)
Platelets: 244 10*3/uL (ref 150–400)
RBC: 2.9 MIL/uL — ABNORMAL LOW (ref 4.22–5.81)
RDW: 16 % — ABNORMAL HIGH (ref 11.5–15.5)
WBC: 6.8 10*3/uL (ref 4.0–10.5)

## 2015-10-24 LAB — TROPONIN I: Troponin I: 0.05 ng/mL — ABNORMAL HIGH (ref <0.031)

## 2015-10-24 MED ORDER — PERFLUTREN LIPID MICROSPHERE
1.0000 mL | INTRAVENOUS | Status: AC | PRN
  Administered 2015-10-24: 2 mL via INTRAVENOUS
  Filled 2015-10-24: qty 10

## 2015-10-24 MED ORDER — HEPARIN (PORCINE) IN NACL 100-0.45 UNIT/ML-% IJ SOLN
600.0000 [IU]/h | INTRAMUSCULAR | Status: DC
  Administered 2015-10-24: 600 [IU]/h via INTRAVENOUS
  Filled 2015-10-24: qty 250

## 2015-10-24 MED ORDER — HYDROCODONE-ACETAMINOPHEN 5-325 MG PO TABS
1.0000 | ORAL_TABLET | ORAL | Status: DC | PRN
  Administered 2015-10-24 – 2015-10-25 (×2): 1 via ORAL
  Filled 2015-10-24 (×2): qty 1

## 2015-10-24 MED ORDER — PERFLUTREN LIPID MICROSPHERE
INTRAVENOUS | Status: AC
  Filled 2015-10-24: qty 10

## 2015-10-24 MED ORDER — ONDANSETRON HCL 4 MG/2ML IJ SOLN
4.0000 mg | Freq: Four times a day (QID) | INTRAMUSCULAR | Status: DC | PRN
  Administered 2015-10-24 – 2015-10-25 (×2): 4 mg via INTRAVENOUS
  Filled 2015-10-24 (×2): qty 2

## 2015-10-24 NOTE — Progress Notes (Signed)
PROGRESS NOTE    [name]  GEX:[id number]  DOB: 1946/07/29  DOA: 10/22/2015 PCP: Jeanine Luz, FNP Outpatient Specialists:   Hospital course: 70 year old male with history of CAD, status post PCI of LAD with BMS 04/03/06 at [facility], ischemic cardiomyopathy status post BiV ICD, chronic systolic CHF, HLD, HTN, CVA, multiple sclerosis, bipolar disorder and failure to thrive, presented to [facility] ED on 10/22/15 with history of progressive declining health over several months with weight loss and weakness and one-week history of abdominal pain, nausea, dyspnea on minimal exertion and weakness to a point where he was too weak to get out of bed. Per EDP, family no longer able to adequately care for him and requesting nursing home placement. In the ED, patient hypoxic in the mid 80s on room air, EKG showed paced rhythm and QTC 644 ms, chest x-ray demonstrated mild interstitial and pulmonary edema, urine microscopy suggestive of UTI. He was admitted for further evaluation and management of possible UTI and adult failure to thrive.  Assessment & Plan:  UTI complicating urinary retention: -  Foley catheter placed in ED. Started empirically on IV Rocephin-continue same pending culture sensitivity results. - Complaints of abdominal pain this a.m., no evidence of urinary retention on bladder scan, will check KUB.  Dehydration: -  Although chest x-ray was reported as mild interstitial and pulmonary edema, patient clinically appears dehydrated. Did receive a dose of Lasix in the ED. continue to hold diuresis, continue with gentle hydration especially in the setting of low blood pressure.  Chronic systolic CHF/ischemic cardiomyopathy/CAD/ICD: - 2-D echo 2014: LVEF 20% with diffuse hypokinesis. Patient asymptomatic of chest pain or dyspnea. Minimally elevated troponin of unclear significance and may be from demand ischemia. Monitor on telemetry. Cycle troponins. Repeat  2-D echo suggested LV thrombus, cardiology consulted,. Consider resuming diuretics when adequately hydrated. Continue Plavix, statins, and carvedilol, hold ACEI given his soft blood pressure.  Essential hypertension:  - Low pressure on the lower side, will hold ACEI, continue with beta blockers   Hyperlipidemia:   - Continue statins.  CVA:  - Continue Plavix.  Bipolar disorder:  - Continue fluoxetine and valproate. Patient appears pleasantly confused-unclear baseline.  Anemia: -  Follow CBCs.  Prolonged QTC - : Keep potassium >4 and magnesium >2. Monitor on telemetry. Avoid QTC prolonging medications. Reviewing prior EKGs-always has had QTC prolongation-unsure if truly prolonged d/t BBB morphology  Adult failure to thrive:  - Probably multifactorial secondary to UTI, dehydration complicating multiple underlying severe comorbidities and age.  DVT prophylaxis: Lovenox Code Status: Full Family Communication: None at bedside Disposition Plan: To be determined. ? SNF when stable.   Consultants:  None  Procedures:  Foley catheter 2/13 >  Antimicrobials:  IV Rocephin 2/13 >  Subjective: Pleasantly confused. Denies chest pain or shortness of breath, complains of some abdominal pain.  Objective: Filed Vitals:   10/23/15 1054 10/23/15 1339 10/23/15 2123 10/24/15 0505  BP: 112/70 97/63 81/48  96/54  Pulse: 70 68 68 65  Temp: 97.6 F (36.4 C) 97.8 F (36.6 C) 97.7 F (36.5 C) 98.3 F (36.8 C)  TempSrc: Axillary Oral Oral Oral  Resp: Height:  (1.753 m)     Weight: 46.4 kg (102 lb 4.7 oz)   47.4 kg (104 lb 8 oz)  SpO2: 95% 93% 92% 95%    Intake/Output Summary (Last 24 hours) at 10/24/15 1256 Last data filed at 10/23/15 1341  Gross per 24 hour  Intake      0 ml  Output    100 ml  Net   -100 ml   Filed Weights   10/23/15 1054 10/24/15 0505  Weight: 46.4 kg (102 lb 4.7 oz) 47.4 kg (104 lb 8 oz)    Exam:  General exam:chronically ill-appearing  male, unkempt/disheveled, lying comfortably propped up in bed. Oral mucosa dry.  Respiratory system: Clear. No increased work of breathing. Cardiovascular system: S1 & S2 heard, RRR. No JVD, murmurs, gallops, clicks or pedal edema. Gastrointestinal system: Abdomen is nondistended, soft , mildly tender in epigastric area,. Normal bowel sounds heard.Foley catheter +.  Central nervous system: Alert and oriented only to self. No focal neurological deficits. Extremities: Symmetric 5 x 5 power.Couple of superficial abrasions on her legs.  Data Reviewed: Basic Metabolic Panel:  Recent Labs Lab 10/22/15 1622 10/23/15 1644 10/24/15 0343  NA 133*  --  133*  K 3.7  --  4.8  CL 101  --  101  CO2 25  --  23  GLUCOSE 114*  --  107*  BUN 38*  --  21*  CREATININE 1.16  --  0.92  CALCIUM 8.3*  --  8.5*  MG  --  2.0  --    Liver Function Tests:  Recent Labs Lab 10/22/15 1622  AST 31  ALT 10*  ALKPHOS 53  BILITOT 0.4  PROT 8.2*  ALBUMIN 2.8*    Recent Labs Lab 10/22/15 1622  LIPASE 44   No results for input(s): AMMONIA in the last 168 hours. CBC:  Recent Labs Lab 10/22/15 1446 10/24/15 0343  WBC 7.8 6.8  NEUTROABS 6.8  --   HGB 9.5* 8.5*  HCT 30.4* 27.7*  MCV 94.1 95.5  PLT 243 244   Cardiac Enzymes:  Recent Labs Lab 10/22/15 1622 10/23/15 1024 10/23/15 1644 10/23/15 2259  TROPONINI 0.05* 0.05* 0.04* 0.05*   BNP (last 3 results) No results for input(s): PROBNP in the last 8760 hours. CBG:  Recent Labs Lab 10/22/15 1534  GLUCAP 112*    No results found for this or any previous visit (from the past 240 hour(s)).       Studies: Dg Chest Portable 1 View  10/22/2015  CLINICAL DATA:  Cough, short of breath EXAM: PORTABLE CHEST 1 VIEW COMPARISON:  Radiograph 05/21/1949 FINDINGS: LEFT-sided pacemaker overlies normal cardiac silhouette. There is chronic bronchitic markings centrally. There is fine airspace disease in the LEFT lower lobe and to a lesser degree  the RIGHT lower lobe. Mild linear interstitial markings peripherally. IMPRESSION: Mild interstitial edema and pulmonary edema. Electronically Signed   By: Genevive Bi M.D.   On: 10/22/2015 15:53        Scheduled Meds: . atorvastatin  40 mg Oral Daily  . carvedilol  25 mg Oral BID WC  . cefTRIAXone (ROCEPHIN)  IV  1 g Intravenous QHS  . clopidogrel  75 mg Oral Daily  . enoxaparin (LOVENOX) injection  40 mg Subcutaneous QHS  . famotidine  20 mg Oral Daily  . FLUoxetine  20 mg Oral Daily  . sodium chloride flush  3 mL Intravenous Q12H  . valproic acid  250 mg Oral BID   Continuous Infusions:    Principal Problem:   Acute respiratory failure (HCC) Active Problems:   Essential hypertension   CAD S/P percutaneous coronary angioplasty   Cardiomyopathy, ischemic   Chronic combined systolic and diastolic CHF, NYHA class 3 (HCC)   Protein-calorie malnutrition, severe (HCC)   UTI (urinary tract  infection)   Acute on chronic systolic (congestive) heart failure (HCC)   Failure to thrive in adult   Complicated UTI (urinary tract infection)   Abnormal echocardiogram findings without diagnosis    Time spent: 30 minutes.    Huey Bienenstock, MD Pager [phone]  If 7PM-7AM, please contact night-coverage [email or web address] Password [email or web address] 10/24/2015, 12:56 PM    LOS: 2 days

## 2015-10-24 NOTE — Progress Notes (Signed)
ANTICOAGULATION CONSULT NOTE - Initial Consult  Pharmacy Consult for Heparin Indication: apical thrombus  Allergies  Allergen Reactions  . Meperidine Hcl Other (See Comments)    unknown    Patient Measurements: Height: 5\' 9"  (175.3 cm) Weight: 104 lb 8 oz (47.4 kg) IBW/kg (Calculated) : 70.7  Vital Signs:    Labs:  Recent Labs  10/22/15 1446  10/22/15 1622 10/23/15 0356 10/23/15 1024 10/23/15 1644 10/23/15 2259 10/24/15 0343  HGB 9.5*  --   --   --   --   --   --  8.5*  HCT 30.4*  --   --   --   --   --   --  27.7*  PLT 243  --   --   --   --   --   --  244  APTT  --   --   --  34  --   --   --   --   LABPROT  --   --   --  13.0  --   --   --   --   INR  --   --   --  0.96  --   --   --   --   CREATININE  --   --  1.16  --   --   --   --  0.92  TROPONINI  --   < > 0.05*  --  0.05* 0.04* 0.05*  --   < > = values in this interval not displayed.  Estimated Creatinine Clearance: 51.5 mL/min (by C-G formula based on Cr of 0.92).   Medical History: Past Medical History  Diagnosis Date  . Coronary artery disease     s/p PCI of LAD with BMS 04/03/06 at [facility]  . Hyperlipidemia   . Hypertension   . Cerebrovascular accident [facility])     history of cerebrovascular accident with residual left hemiparesis in 2002, seen at [facility]  . Multiple sclerosis (HCC)   . Ischemic cardiomyopathy     s/p BiV ICD implanted 2007 at [facility]  . Chronic combined systolic and diastolic congestive heart failure, NYHA class 3 (HCC)   . Bipolar disorder (HCC)   . Failure to thrive (0-17)     Medications:  Infusions:    Assessment: 70 yo M admitted on 2/14 with chronic HF admitted with FTT and UTI.  He was found to have LV thrombus per ECHO. Pharmacy asked to initiate heparin infusion.   He has been on prophylactic Lovenox 40mg  since admission.  Last dose 2/14 at 2200.  Baseline coags and platelet count wnl.  Hg low and trending down.  Spoke with Dr  Randol Kern who requests no heparin bolus secondary to low Hg.     Goal of Therapy:  Heparin level 0.3-0.7 units/ml Monitor platelets by anticoagulation protocol: Yes   Plan:  Start heparin infusion at 600 units/hr Check anti-Xa level in 8 hours and daily while on heparin Continue to monitor H&H and platelets  Elson Clan 10/24/2015,6:14 PM

## 2015-10-24 NOTE — Clinical Social Work Placement (Signed)
   CLINICAL SOCIAL WORK PLACEMENT  NOTE  Date:  10/24/2015  Patient Details  Name: [name] MRN: [id number] Date of Birth: 10/05/1945  Clinical Social Work is seeking post-discharge placement for this patient at the Skilled  Nursing Facility level of care (*CSW will initial, date and re-position this form in  chart as items are completed):  Yes   Patient/family provided with [facility] Clinical Social Work Department's list of facilities offering this level of care within the geographic area requested by the patient (or if unable, by the patient's family).  Yes   Patient/family informed of their freedom to choose among providers that offer the needed level of care, that participate in Medicare, Medicaid or managed care program needed by the patient, have an available bed and are willing to accept the patient.  Yes   Patient/family informed of [facility]'s ownership interest in [facility] and [facility], as well as of the fact that they are under no obligation to receive care at these facilities.  PASRR submitted to EDS on       PASRR number received on       Existing PASRR number confirmed on 10/24/15     FL2 transmitted to all facilities in geographic area requested by pt/family on 10/24/15     FL2 transmitted to all facilities within larger geographic area on       Patient informed that his/her managed care company has contracts with or will negotiate with certain facilities, including the following:            Patient/family informed of bed offers received.  Patient chooses bed at       Physician recommends and patient chooses bed at      Patient to be transferred to   on  .  Patient to be transferred to facility by       Patient family notified on   of transfer.  Name of family member notified:        PHYSICIAN Please sign FL2     Additional Comment:    _______________________________________________ Orson Eva, LCSW 10/24/2015, 4:34 PM

## 2015-10-24 NOTE — NC FL2 (Signed)
[place] MEDICAID FL2 LEVEL OF CARE SCREENING TOOL     IDENTIFICATION  Patient Name: [name] Birthdate: [birth year] Sex: male Admission Date (Current Location): 10/22/2015  [facility] and [place] Number:  Producer, television/film/video and Address:  [facility],  [address] [place] [address]      Provider Number: [id number]  Attending Physician Name and Address:  Starleen Arms, MD  Relative Name and Phone Number:       Current Level of Care: Hospital Recommended Level of Care: Skilled Nursing Facility Prior Approval Number:    Date Approved/Denied:   PASRR Number: [id number]  Discharge Plan: SNF    Current Diagnoses: Patient Active Problem List   Diagnosis Date Noted  . Abnormal echocardiogram findings without diagnosis 10/24/2015  . Acute on chronic systolic (congestive) heart failure (HCC) 10/23/2015  . Failure to thrive in adult 10/23/2015  . Complicated UTI (urinary tract infection) 10/23/2015  . UTI (urinary tract infection) 10/22/2015  . Diarrhea 08/07/2014  . Eye discharge 08/07/2014  . Itching 08/07/2014  . Total self-care deficit 05/21/2014  . Protein-calorie malnutrition, severe (HCC) 08/09/2013  . Altered mental state 08/05/2013  . Acute encephalopathy 08/05/2013  . History of stroke 08/05/2013  . Biventricular ICD (implantable cardioverter-defibrillator) in place 08/05/2013  . Acute respiratory failure (HCC) 08/05/2013  . Encephalopathy acute 08/05/2013  . Inguinal hernia, right 07/04/2012  . Chronic combined systolic and diastolic CHF, NYHA class 3 (HCC) 08/04/2011  . Tobacco abuse 08/04/2011  . HYPERLIPIDEMIA 01/19/2008  . Bipolar disorder (HCC) 01/19/2008  . Multiple sclerosis (HCC) 01/19/2008  . Essential hypertension 01/19/2008  . CAD S/P percutaneous coronary angioplasty 01/19/2008  . Cardiomyopathy, ischemic 01/19/2008  . ALCOHOL ABUSE, HX OF 01/19/2008    Orientation RESPIRATION BLADDER Height & Weight     Self,  Time, Situation, Place  Normal External catheter (condom cath) Weight: 104 lb 8 oz (47.4 kg) Height:  5\' 9"  (175.3 cm)  BEHAVIORAL SYMPTOMS/MOOD NEUROLOGICAL BOWEL NUTRITION STATUS   (n/a)  (NONE) Continent Diet (Diet Heart)  AMBULATORY STATUS COMMUNICATION OF NEEDS Skin   Extensive Assist Verbally Normal                       Personal Care Assistance Level of Assistance  Bathing, Dressing, Feeding Bathing Assistance: Maximum assistance Feeding assistance: Limited assistance Dressing Assistance: Maximum assistance     Functional Limitations Info  Sight, Hearing, Speech Sight Info: Adequate Hearing Info: Adequate Speech Info: Adequate    SPECIAL CARE FACTORS FREQUENCY  PT (By licensed PT), OT (By licensed OT)     PT Frequency: 5 x a week OT Frequency: 5 x a week            Contractures      Additional Factors Info  Code Status Code Status Info: FULL code status             Current Medications (10/24/2015):  This is the current hospital active medication list Current Facility-Administered Medications  Medication Dose Route Frequency Provider Last Rate Last Dose  . 0.9 %  sodium chloride infusion  250 mL Intravenous PRN Briscoe Deutscher, MD      . acetaminophen (TYLENOL) tablet 650 mg  650 mg Oral Q4H PRN Elease Etienne, MD   650 mg at 10/24/15 0846  . atorvastatin (LIPITOR) tablet 40 mg  40 mg Oral Daily Briscoe Deutscher, MD   40 mg at 10/23/15 1631  . carvedilol (COREG) tablet 25 mg  25 mg Oral BID WC Briscoe Deutscher, MD   25 mg at 10/24/15 0846  . cefTRIAXone (ROCEPHIN) 1 g in dextrose 5 % 50 mL IVPB  1 g Intravenous QHS Lorenza Evangelist, RPH   1 g at 10/23/15 2350  . clopidogrel (PLAVIX) tablet 75 mg  75 mg Oral Daily Briscoe Deutscher, MD   75 mg at 10/24/15 0846  . enoxaparin (LOVENOX) injection 40 mg  40 mg Subcutaneous QHS Lavone Neri Opyd, MD   40 mg at 10/23/15 2126  . famotidine (PEPCID) tablet 20 mg  20 mg Oral Daily Briscoe Deutscher, MD   20 mg at 10/24/15  1032  . FLUoxetine (PROZAC) capsule 20 mg  20 mg Oral Daily Briscoe Deutscher, MD   20 mg at 10/24/15 1032  . HYDROcodone-acetaminophen (NORCO/VICODIN) 5-325 MG per tablet 1 tablet  1 tablet Oral Q4H PRN Leana Roe Elgergawy, MD      . perflutren lipid microspheres (DEFINITY) IV suspension  1-10 mL Intravenous PRN Starleen Arms, MD   2 mL at 10/24/15 1456  . sodium chloride flush (NS) 0.9 % injection 3 mL  3 mL Intravenous Q12H Briscoe Deutscher, MD   3 mL at 10/23/15 1632  . sodium chloride flush (NS) 0.9 % injection 3 mL  3 mL Intravenous PRN Briscoe Deutscher, MD      . valproic acid (DEPAKENE) 250 MG capsule 250 mg  250 mg Oral BID Briscoe Deutscher, MD   250 mg at 10/24/15 1032     Discharge Medications: Please see discharge summary for a list of discharge medications.  Relevant Imaging Results:  Relevant Lab Results:   Additional Information SSN: [id number]  Alecea Trego, Selena Lesser A, LCSW

## 2015-10-24 NOTE — Consult Note (Signed)
Cardiologist: Allred Reason for Consult: Possible LV thrombus Referring Physician: Elgergawy  [name] is an 70 y.o. male.  HPI:   The patient is a 70yo male with a history of CAD, ischemic CM, Chronic systolic heart failure, BiVICD, HLD, HTN, CVA 2014, multiple sclerosis, bipolar DO.  His device reached ERI last May and he underwent down grade to a CRT-P Medtronic Model C4TR01 (SN [id number]) BiV pacemaker.  He had an echo in 07/2013 and his EF was 20%, The atrium was moderately to severelydilated. This echo was done as a result of a CVA.  No cardiac source was identified.  He presents this time with abdominal pain, nausea and malaise.  An echo was completed due to mild troponin elevation and it revealed an EF of 20-25%, Diffuse hypokinesis. There is akinesis of the distal septal myocardium. There is akinesis of theentireapical myocardium. There is severe hypokinesis of thebasal-midinferoseptal myocardium. There is akinesis of the midanteroseptal and apical anterior myocardium. There is moderatehypokinesis of the entireinferolateral myocardium.  Spontaneous echo contrast, indicative of stasis. This was very prominent in the apex. Cannot rule outearly forming thrombus.  We are asked to see him for this reason.    He reports coming to the hospital with abd pain and diarrhea.  He denies, vomiting, fever, shortness of breath, orthopnea, dizziness, PND, cough, congestion, hematochezia, melena, lower extremity edema.  He has not walked in a year.  No family present.       Past Medical History  Diagnosis Date  . Coronary artery disease     s/p PCI of LAD with BMS 04/03/06 at [facility]  . Hyperlipidemia   . Hypertension   . Cerebrovascular accident [facility])     history of cerebrovascular accident with residual left hemiparesis in 2002, seen at [facility]  . Multiple sclerosis ([place])   . Ischemic cardiomyopathy     s/p BiV ICD implanted 2007 at [facility]  .  Chronic systolic congestive heart failure ([place])   . Bipolar disorder ([place])   . Failure to thrive (0-17)     Past Surgical History  Procedure Laterality Date  . Tonsillectomy    . Vasectomy    . Implant of automatic cardioverter defibrillator      December 26, 2004 MDT BiV ICD implanted by Dr Elonda Husky in [facility]  . Ptca      PCI LAD with BMS 04/03/06  . Ep implantable device N/A 01/18/2015    Procedure: BIV PPM Downgrade;  Surgeon: Thompson Grayer, MD;  Location: [place] [facility];  Service: Cardiovascular;  Laterality: N/A;    Family History  Problem Relation Age of Onset  . Heart disease Mother   . Kidney disease Father   . COPD Sister   . Cancer Neg Hx   . Diabetes Neg Hx     Social History:  reports that he quit smoking about 4 years ago. His smoking use included Cigarettes. He has a 20 pack-year smoking history. He has never used smokeless tobacco. He reports that he drinks alcohol. He reports that he does not use illicit drugs.  Allergies:  Allergies  Allergen Reactions  . Meperidine Hcl Other (See Comments)    unknown    Medications:  Scheduled Meds: . atorvastatin  40 mg Oral Daily  . benazepril  20 mg Oral Daily  . carvedilol  25 mg Oral BID WC  . cefTRIAXone (ROCEPHIN)  IV  1 g Intravenous QHS  . clopidogrel  75 mg Oral Daily  . enoxaparin (LOVENOX) injection  40 mg Subcutaneous QHS  . famotidine  20 mg Oral Daily  . FLUoxetine  20 mg Oral Daily  . sodium chloride flush  3 mL Intravenous Q12H  . valproic acid  250 mg Oral BID   Continuous Infusions:  PRN Meds:.sodium chloride, acetaminophen, sodium chloride flush   Results for orders placed or performed during the hospital encounter of 10/22/15 (from the past 48 hour(s))  CBC     Status: Abnormal   Collection Time: 10/22/15  2:46 PM  Result Value Ref Range   WBC 7.8 4.0 - 10.5 K/uL   RBC 3.23 (L) 4.22 - 5.81 MIL/uL   Hemoglobin 9.5 (L) 13.0 - 17.0 g/dL   HCT 30.4 (L) 39.0 - 52.0 %   MCV 94.1 78.0 - 100.0  fL   MCH 29.4 26.0 - 34.0 pg   MCHC 31.3 30.0 - 36.0 g/dL   RDW 15.9 (H) 11.5 - 15.5 %   Platelets 243 150 - 400 K/uL  Differential     Status: None   Collection Time: 10/22/15  2:46 PM  Result Value Ref Range   Neutrophils Relative % 86 %   Neutro Abs 6.8 1.7 - 7.7 K/uL   Lymphocytes Relative 9 %   Lymphs Abs 0.7 0.7 - 4.0 K/uL   Monocytes Relative 5 %   Monocytes Absolute 0.4 0.1 - 1.0 K/uL   Eosinophils Relative 0 %   Eosinophils Absolute 0.0 0.0 - 0.7 K/uL   Basophils Relative 0 %   Basophils Absolute 0.0 0.0 - 0.1 K/uL  CBG monitoring, ED     Status: Abnormal   Collection Time: 10/22/15  3:34 PM  Result Value Ref Range   Glucose-Capillary 112 (H) 65 - 99 mg/dL  Comprehensive metabolic panel     Status: Abnormal   Collection Time: 10/22/15  4:22 PM  Result Value Ref Range   Sodium 133 (L) 135 - 145 mmol/L   Potassium 3.7 3.5 - 5.1 mmol/L   Chloride 101 101 - 111 mmol/L   CO2 25 22 - 32 mmol/L   Glucose, Bld 114 (H) 65 - 99 mg/dL   BUN 38 (H) 6 - 20 mg/dL   Creatinine, Ser 1.16 0.61 - 1.24 mg/dL   Calcium 8.3 (L) 8.9 - 10.3 mg/dL   Total Protein 8.2 (H) 6.5 - 8.1 g/dL   Albumin 2.8 (L) 3.5 - 5.0 g/dL   AST 31 15 - 41 U/L   ALT 10 (L) 17 - 63 U/L   Alkaline Phosphatase 53 38 - 126 U/L   Total Bilirubin 0.4 0.3 - 1.2 mg/dL   GFR calc non Af Amer >60 >60 mL/min   GFR calc Af Amer >60 >60 mL/min    Comment: (NOTE) The eGFR has been calculated using the CKD EPI equation. This calculation has not been validated in all clinical situations. eGFR's persistently <60 mL/min signify possible Chronic Kidney Disease.    Anion gap 7 5 - 15  Lipase, blood     Status: None   Collection Time: 10/22/15  4:22 PM  Result Value Ref Range   Lipase 44 11 - 51 U/L  Troponin I     Status: Abnormal   Collection Time: 10/22/15  4:22 PM  Result Value Ref Range   Troponin I 0.05 (H) <0.031 ng/mL    Comment:        PERSISTENTLY INCREASED TROPONIN VALUES IN THE RANGE OF 0.04-0.49 ng/mL  CAN BE  SEEN IN:       -UNSTABLE ANGINA       -CONGESTIVE HEART FAILURE       -MYOCARDITIS       -CHEST TRAUMA       -ARRYHTHMIAS       -LATE PRESENTING MYOCARDIAL INFARCTION       -COPD   CLINICAL FOLLOW-UP RECOMMENDED.   Brain natriuretic peptide     Status: Abnormal   Collection Time: 10/22/15  4:22 PM  Result Value Ref Range   B Natriuretic Peptide 225.9 (H) 0.0 - 100.0 pg/mL  Valproic acid level     Status: Abnormal   Collection Time: 10/22/15  4:22 PM  Result Value Ref Range   Valproic Acid Lvl 17 (L) 50.0 - 100.0 ug/mL  Urinalysis, Routine w reflex microscopic (not at [facility])     Status: Abnormal   Collection Time: 10/22/15  9:25 PM  Result Value Ref Range   Color, Urine RED (A) YELLOW    Comment: BIOCHEMICALS MAY BE AFFECTED BY COLOR   APPearance TURBID (A) CLEAR   Specific Gravity, Urine 1.024 1.005 - 1.030   pH 6.0 5.0 - 8.0   Glucose, UA NEGATIVE NEGATIVE mg/dL   Hgb urine dipstick LARGE (A) NEGATIVE   Bilirubin Urine MODERATE (A) NEGATIVE   Ketones, ur 15 (A) NEGATIVE mg/dL   Protein, ur 100 (A) NEGATIVE mg/dL   Nitrite POSITIVE (A) NEGATIVE   Leukocytes, UA MODERATE (A) NEGATIVE  Urine microscopic-add on     Status: Abnormal   Collection Time: 10/22/15  9:25 PM  Result Value Ref Range   Squamous Epithelial / LPF 0-5 (A) NONE SEEN   WBC, UA TOO NUMEROUS TO COUNT 0 - 5 WBC/hpf   RBC / HPF TOO NUMEROUS TO COUNT 0 - 5 RBC/hpf   Bacteria, UA MANY (A) NONE SEEN  Procalcitonin - Baseline     Status: None   Collection Time: 10/23/15  3:56 AM  Result Value Ref Range   Procalcitonin 0.32 ng/mL    Comment:        Interpretation: PCT (Procalcitonin) <= 0.5 ng/mL: Systemic infection (sepsis) is not likely. Local bacterial infection is possible. (NOTE)         ICU PCT Algorithm               Non ICU PCT Algorithm    ----------------------------     ------------------------------         PCT < 0.25 ng/mL                 PCT < 0.1 ng/mL     Stopping of antibiotics             Stopping of antibiotics       strongly encouraged.               strongly encouraged.    ----------------------------     ------------------------------       PCT level decrease by               PCT < 0.25 ng/mL       >= 80% from peak PCT       OR PCT 0.25 - 0.5 ng/mL          Stopping of antibiotics  encouraged.     Stopping of antibiotics           encouraged.    ----------------------------     ------------------------------       PCT level decrease by              PCT >= 0.25 ng/mL       < 80% from peak PCT        AND PCT >= 0.5 ng/mL            Continuin g antibiotics                                              encouraged.       Continuing antibiotics            encouraged.    ----------------------------     ------------------------------     PCT level increase compared          PCT > 0.5 ng/mL         with peak PCT AND          PCT >= 0.5 ng/mL             Escalation of antibiotics                                          strongly encouraged.      Escalation of antibiotics        strongly encouraged.   Lactic acid, plasma     Status: None   Collection Time: 10/23/15  3:56 AM  Result Value Ref Range   Lactic Acid, Venous 0.9 0.5 - 2.0 mmol/L  Protime-INR     Status: None   Collection Time: 10/23/15  3:56 AM  Result Value Ref Range   Prothrombin Time 13.0 11.6 - 15.2 seconds   INR 0.96 0.00 - 1.49  APTT     Status: None   Collection Time: 10/23/15  3:56 AM  Result Value Ref Range   aPTT 34 24 - 37 seconds  Lactic acid, plasma     Status: None   Collection Time: 10/23/15  7:37 AM  Result Value Ref Range   Lactic Acid, Venous 0.9 0.5 - 2.0 mmol/L  Lactic acid, plasma     Status: None   Collection Time: 10/23/15 10:24 AM  Result Value Ref Range   Lactic Acid, Venous 1.9 0.5 - 2.0 mmol/L  Troponin I (q 6hr x 3)     Status: Abnormal   Collection Time: 10/23/15 10:24 AM  Result Value Ref Range   Troponin I 0.05 (H)  <0.031 ng/mL    Comment:        PERSISTENTLY INCREASED TROPONIN VALUES IN THE RANGE OF 0.04-0.49 ng/mL CAN BE SEEN IN:       -UNSTABLE ANGINA       -CONGESTIVE HEART FAILURE       -MYOCARDITIS       -CHEST TRAUMA       -ARRYHTHMIAS       -LATE PRESENTING MYOCARDIAL INFARCTION       -COPD   CLINICAL FOLLOW-UP RECOMMENDED.   Troponin I (q 6hr x 3)     Status: Abnormal   Collection Time: 10/23/15  4:44 PM  Result Value Ref Range  Troponin I 0.04 (H) <0.031 ng/mL    Comment:        PERSISTENTLY INCREASED TROPONIN VALUES IN THE RANGE OF 0.04-0.49 ng/mL CAN BE SEEN IN:       -UNSTABLE ANGINA       -CONGESTIVE HEART FAILURE       -MYOCARDITIS       -CHEST TRAUMA       -ARRYHTHMIAS       -LATE PRESENTING MYOCARDIAL INFARCTION       -COPD   CLINICAL FOLLOW-UP RECOMMENDED.   Magnesium     Status: None   Collection Time: 10/23/15  4:44 PM  Result Value Ref Range   Magnesium 2.0 1.7 - 2.4 mg/dL  Troponin I (q 6hr x 3)     Status: Abnormal   Collection Time: 10/23/15 10:59 PM  Result Value Ref Range   Troponin I 0.05 (H) <0.031 ng/mL    Comment:        PERSISTENTLY INCREASED TROPONIN VALUES IN THE RANGE OF 0.04-0.49 ng/mL CAN BE SEEN IN:       -UNSTABLE ANGINA       -CONGESTIVE HEART FAILURE       -MYOCARDITIS       -CHEST TRAUMA       -ARRYHTHMIAS       -LATE PRESENTING MYOCARDIAL INFARCTION       -COPD   CLINICAL FOLLOW-UP RECOMMENDED.   CBC     Status: Abnormal   Collection Time: 10/24/15  3:43 AM  Result Value Ref Range   WBC 6.8 4.0 - 10.5 K/uL   RBC 2.90 (L) 4.22 - 5.81 MIL/uL   Hemoglobin 8.5 (L) 13.0 - 17.0 g/dL   HCT 27.7 (L) 39.0 - 52.0 %   MCV 95.5 78.0 - 100.0 fL   MCH 29.3 26.0 - 34.0 pg   MCHC 30.7 30.0 - 36.0 g/dL   RDW 16.0 (H) 11.5 - 15.5 %   Platelets 244 150 - 400 K/uL  Basic metabolic panel     Status: Abnormal   Collection Time: 10/24/15  3:43 AM  Result Value Ref Range   Sodium 133 (L) 135 - 145 mmol/L   Potassium 4.8 3.5 - 5.1 mmol/L     Comment: DELTA CHECK NOTED REPEATED TO VERIFY    Chloride 101 101 - 111 mmol/L   CO2 23 22 - 32 mmol/L   Glucose, Bld 107 (H) 65 - 99 mg/dL   BUN 21 (H) 6 - 20 mg/dL   Creatinine, Ser 0.92 0.61 - 1.24 mg/dL   Calcium 8.5 (L) 8.9 - 10.3 mg/dL   GFR calc non Af Amer >60 >60 mL/min   GFR calc Af Amer >60 >60 mL/min    Comment: (NOTE) The eGFR has been calculated using the CKD EPI equation. This calculation has not been validated in all clinical situations. eGFR's persistently <60 mL/min signify possible Chronic Kidney Disease.    Anion gap 9 5 - 15    Dg Chest Portable 1 View  10/22/2015  CLINICAL DATA:  Cough, short of breath EXAM: PORTABLE CHEST 1 VIEW COMPARISON:  Radiograph 05/21/1949 FINDINGS: LEFT-sided pacemaker overlies normal cardiac silhouette. There is chronic bronchitic markings centrally. There is fine airspace disease in the LEFT lower lobe and to a lesser degree the RIGHT lower lobe. Mild linear interstitial markings peripherally. IMPRESSION: Mild interstitial edema and pulmonary edema. Electronically Signed   By: Suzy Bouchard M.D.   On: 10/22/2015 15:53    Review of Systems  Constitutional: Negative  for fever and chills.  HENT: Negative for congestion and sore throat.   Respiratory: Negative for cough and shortness of breath.   Cardiovascular: Negative for chest pain, orthopnea, leg swelling and PND.  Gastrointestinal: Positive for nausea, abdominal pain and diarrhea. Negative for vomiting.  Genitourinary: Negative for hematuria.  Musculoskeletal: Negative for myalgias.  Neurological: Positive for weakness. Negative for dizziness.  All other systems reviewed and are negative.  Blood pressure 96/54, pulse 65, temperature 98.3 F (36.8 C), temperature source Oral, resp. rate 20, height _0  (1.753 m), weight 104 lb 8 oz (47.4 kg), SpO2 95 %. Physical Exam  Nursing note and vitals reviewed. Constitutional: He appears well-developed. No distress.  Very thin and  malnourished  HENT:  Head: Normocephalic and atraumatic.  Eyes: EOM are normal. Pupils are equal, round, and reactive to light. No scleral icterus.  Neck: Normal range of motion. Neck supple. No JVD present.  Cardiovascular: Normal rate, regular rhythm, S1 normal and S2 normal.   No murmur heard. Pulses:      Radial pulses are 2+ on the right side, and 2+ on the left side.       Dorsalis pedis pulses are 1+ on the right side, and 1+ on the left side.  Respiratory: Effort normal and breath sounds normal. No respiratory distress. He has no wheezes. He has no rales.  GI: Soft. Bowel sounds are normal. He exhibits no distension. There is no tenderness.  Musculoskeletal: He exhibits no edema.  Neurological: He is alert. He exhibits abnormal muscle tone.  Generally weak.  Left greater then right weakness in upper extremities  Skin: Skin is warm and dry.  Psychiatric: He has a normal mood and affect.    Assessment/Plan: Principal Problem:   Acute respiratory failure (HCC) Active Problems:   Essential hypertension   Protein-calorie malnutrition, severe (HCC)   UTI (urinary tract infection)   Acute on chronic systolic (congestive) heart failure (HCC)   Failure to thrive in adult   Complicated UTI (urinary tract infection)   LV thrombus?   Dehydration  Echocardiogram revealed possible LV thrombus.  Will repeat limited echo with definity contrast. This may be the source of his CVA back in 2014.  He says he has not walked in a year.  He is V-paced.  No acute CHF and is being treated for dehydration.  Troponin was only 0.05.    Tarri Fuller, [place] 10/24/2015, 11:32 AM

## 2015-10-24 NOTE — Progress Notes (Signed)
Initial Nutrition Assessment  DOCUMENTATION CODES:   Severe malnutrition in context of chronic illness, Underweight  INTERVENTION:   -Provide daily snacks -Encourage PO intake -RD to continue to monitor  NUTRITION DIAGNOSIS:   Malnutrition related to chronic illness as evidenced by severe depletion of body fat, severe depletion of muscle mass.  GOAL:   Patient will meet greater than or equal to 90% of their needs  MONITOR:   PO intake, Labs, Weight trends, I & O's  REASON FOR ASSESSMENT:   Consult Assessment of nutrition requirement/status  ASSESSMENT:   70 year old male with history of CAD, status post PCI of LAD with BMS 04/03/06 at [facility], ischemic cardiomyopathy status post BiV ICD, chronic systolic CHF, HLD, HTN, CVA, multiple sclerosis, bipolar disorder and failure to thrive, presented to [facility] ED on 10/22/15 with history of progressive declining health over several months with weight loss and weakness and one-week history of abdominal pain, nausea, dyspnea on minimal exertion and weakness to a point where he was too weak to get out of bed.  Patient in room with no family at bedside. Pt provided limited history. Pt states that he has not been eating well. Per H&P, pt has been experiencing abdominal pain with nausea x 1 week PTA. He states he has not ordered lunch and is not interested in placing order at this time. Pt does not like Ensure/Boost drinks and declines trying them. Pt was amenable to daily snacks. RD to order. Pt only eating 20% of one meal yesterday. Per RN, he had some applesauce this morning.   Per weight history, pt has had progressive weight loss over the last couple of years. UBW per pt is 175 lb.  Nutrition-Focused physical exam completed. Findings are severe fat depletion, severe muscle depletion, and no edema.   Labs reviewed: Low Na Mg WNL  Diet Order:  Diet Heart Room service appropriate?: Yes; Fluid  consistency:: Thin; Fluid restriction:: 1500 mL Fluid  Skin:  Reviewed, no issues  Last BM:  2/13  Height:   Ht Readings from Last 1 Encounters:  10/23/15 5\' 9"  (1.753 m)    Weight:   Wt Readings from Last 1 Encounters:  10/24/15 104 lb 8 oz (47.4 kg)    Ideal Body Weight:  72.7 kg  BMI:  Body mass index is 15.42 kg/(m^2).  Estimated Nutritional Needs:   Kcal:  1700-1900  Protein:  80-90g  Fluid:  1.5L/day  EDUCATION NEEDS:   No education needs identified at this time  Tilda Franco, MS, RD, LDN Pager: [phone] After Hours Pager: [phone]

## 2015-10-24 NOTE — Progress Notes (Signed)
*  PRELIMINARY RESULTS* Echocardiogram 2D Echocardiogram has been performed.  Jeryl Columbia 10/24/2015, 4:32 PM

## 2015-10-24 NOTE — Clinical Social Work Note (Signed)
Clinical Social Work Assessment  Patient Details  Name: [name] MRN: [id number] Date of Birth: 1946-05-18  Date of referral:  10/24/15               Reason for consult:  Discharge Planning                Permission sought to share information with:  Other (friend) Permission granted to share information::  Yes, Verbal Permission Granted  Name::     Andreas Blower  Agency::     Relationship::  friend  Contact Information:  [phone]  Housing/Transportation Living arrangements for the past 2 months:  Single Family Home Source of Information:  Patient Patient Interpreter Needed:  None Criminal Activity/Legal Involvement Pertinent to Current Situation/Hospitalization:  No - Comment as needed Significant Relationships:  Friend Lives with:  Self (friend, Lavonne assist with pt care) Do you feel safe going back to the place where you live?  No Need for family participation in patient care:  No (Coment) (pt agreeable to involve pt friend, Lavonne-pt declined for CSW to contact pt son)  Care giving concerns:  Pt admitted from home. Pt lives alone, but per chart review pt friend, Hildred Laser was assisting with pt care at home. PT recommending SNF.    Social Worker assessment / plan:  CSW received referral for New SNF.   CSW met with pt alone at bedside. CSW introduced self and explained role. Pt reports that he is unable to walk due to MS. Pt states he cannot return home and agreeable to SNF. CSW inquired with pt if he had Medicaid and pt states that he only has [employer] Medicare. Pt is unsure if he will be able to return home following SNF stay. CSW discussed with pt that [facility] Medicare only covers short term and pt will need to ask for assistance from family or SNF re: applying for Medicaid. Pt states pt son lives in [place] and pt does not want CSW to contact pt son. Pt did agree for CSW to contact pt friend, Lavonne.   CSW completed FL2 and initiated SNF search to [facility].  CSW contacted  pt friend, Lavonne via telephone and left voice message.  CSW to follow up with pt regarding SNF options.   CSW to continue to follow.   Employment status:  Retired, Disabled (Comment on whether or not currently receiving Disability) Insurance information:  Managed Medicare PT Recommendations:  [place] / Referral to community resources:  [place]  Patient/Family's Response to care:  Pt alert and oriented x 4. Pt recognizes that he is unable to return home at this time. Pt agreeable to SNF.  Patient/Family's Understanding of and Emotional Response to Diagnosis, Current Treatment, and Prognosis:  Pt able to discuss diagnosis and that he is not able to ambulate. Pt speech slurred at time making it difficult to understand pt.   Emotional Assessment Appearance:  Appears stated age Attitude/Demeanor/Rapport:  Other (cooperative) Affect (typically observed):  Appropriate Orientation:  Oriented to Self, Oriented to Place, Oriented to  Time, Oriented to Situation Alcohol / Substance use:  Not Applicable Psych involvement (Current and /or in the community):  No (Comment)  Discharge Needs  Concerns to be addressed:  Discharge Planning Concerns Readmission within the last 30 days:  No Current discharge risk:  Physical Impairment Barriers to Discharge:  Continued Medical Work up   Ladell Pier, LCSW 10/24/2015, 4:28 PM  [phone]

## 2015-10-24 NOTE — Evaluation (Signed)
Physical Therapy Evaluation Patient Details Name: [name] MRN: [id number] DOB: 10/07/1945 Today's Date: 10/24/2015   History of Present Illness  70 year old male with history of CAD, status post PCI of LAD , ischemic cardiomyopathy status post BiV ICD, chronic systolic CHF, HLD, HTN, CVA, multiple sclerosis, bipolar disorder and failure to thrive, presented to [facility] ED on 10/22/15 with history of progressive declining health over several months with weight loss and weakness and one-week history of abdominal pain, nausea, dyspnea on minimal exertion and weakness to a point where he was too weak to get out of bed. Per EDP, family no longer able to adequately care for him and requesting nursing home placement. In the ED, patient hypoxic in the mid 80s on room air, EKG showed paced rhythm and QTC 644 ms, chest x-ray demonstrated mild interstitial and pulmonary edema, urine microscopy suggestive of UTI. He was admitted for further evaluation and management of possible UTI and adult failure to thrive.  Clinical Impression  Patient  Was very difficult to understand his speech, indicates that he is not wanting to get  Up, appears disheveled. Patient will benefit from trial of PT while in acute care. Chart indicates bed bound PTA.    Follow Up Recommendations SNF;Supervision/Assistance - 24 hour    Equipment Recommendations  None recommended by PT    Recommendations for Other Services       Precautions / Restrictions Precautions Precautions: Fall      Mobility  Bed Mobility Overal bed mobility: Needs Assistance Bed Mobility: Rolling Rolling: Total assist         General bed mobility comments: attemopted to assist to sitting but patient was  resistive and groaned.  Transfers                 General transfer comment: NT as patient was indicating that he did not want to get up.  Ambulation/Gait                Stairs            Wheelchair  Mobility    Modified Rankin (Stroke Patients Only)       Balance                                             Pertinent Vitals/Pain Pain Assessment: Faces Faces Pain Scale: Hurts little more Pain Location: points to abdomen Pain Descriptors / Indicators: Grimacing;Guarding Pain Intervention(s): Limited activity within patient's tolerance    Home Living Family/patient expects to be discharged to:: Skilled nursing facility Living Arrangements: Non-relatives/Friends               Additional Comments: per chart, had a caregiver    Prior Function           Comments: per chart, bed bound PTA due to decline     Hand Dominance        Extremity/Trunk Assessment   Upper Extremity Assessment: LUE deficits/detail       LUE Deficits / Details: poor motor control of the  UE   Lower Extremity Assessment: LLE deficits/detail;RLE deficits/detail RLE Deficits / Details: same as right LLE Deficits / Details: legs are drawn  up, able to strecth to near full extension,      Communication   Communication: Expressive difficulties  Cognition Arousal/Alertness: Awake/alert   Overall Cognitive Status: Difficult to assess  Area of Impairment: Following commands               General Comments: patient's speech is garbled, did say "I think I have diarrhea."    General Comments      Exercises        Assessment/Plan    PT Assessment Patient needs continued PT services  PT Diagnosis Generalized weakness;Altered mental status;Acute pain   PT Problem List Decreased strength;Decreased range of motion;Decreased activity tolerance;Decreased cognition;Pain  PT Treatment Interventions Functional mobility training;Therapeutic activities;Patient/family education;Cognitive remediation   PT Goals (Current goals can be found in the Care Plan section) Acute Rehab PT Goals Patient Stated Goal: none  PT Goal Formulation: Patient unable to participate in goal  setting Time For Goal Achievement: 11/07/15 Potential to Achieve Goals: Fair    Frequency Min 2X/week   Barriers to discharge Decreased caregiver support      Co-evaluation               End of Session   Activity Tolerance: Patient limited by pain;Other (comment) (patient indicating that he did not want to move) Patient left: in bed Nurse Communication: Mobility status         Time: 3382-5053 PT Time Calculation (min) (ACUTE ONLY): 10 min   Charges:   PT Evaluation $PT Eval Low Complexity: 1 Procedure     PT G CodesRada Hay 10/24/2015, 1:39 PM Blanchard Kelch PT [phone]

## 2015-10-25 ENCOUNTER — Inpatient Hospital Stay (HOSPITAL_COMMUNITY): Payer: Medicare Other

## 2015-10-25 DIAGNOSIS — I255 Ischemic cardiomyopathy: Secondary | ICD-10-CM

## 2015-10-25 DIAGNOSIS — I513 Intracardiac thrombosis, not elsewhere classified: Secondary | ICD-10-CM

## 2015-10-25 DIAGNOSIS — T17998S Other foreign object in respiratory tract, part unspecified causing other injury, sequela: Secondary | ICD-10-CM

## 2015-10-25 DIAGNOSIS — J9601 Acute respiratory failure with hypoxia: Secondary | ICD-10-CM

## 2015-10-25 DIAGNOSIS — R627 Adult failure to thrive: Secondary | ICD-10-CM | POA: Insufficient documentation

## 2015-10-25 DIAGNOSIS — T17908A Unspecified foreign body in respiratory tract, part unspecified causing other injury, initial encounter: Secondary | ICD-10-CM | POA: Insufficient documentation

## 2015-10-25 DIAGNOSIS — L8990 Pressure ulcer of unspecified site, unspecified stage: Secondary | ICD-10-CM | POA: Insufficient documentation

## 2015-10-25 DIAGNOSIS — I240 Acute coronary thrombosis not resulting in myocardial infarction: Secondary | ICD-10-CM | POA: Diagnosis present

## 2015-10-25 LAB — CBC
HCT: 30.8 % — ABNORMAL LOW (ref 39.0–52.0)
Hemoglobin: 9.6 g/dL — ABNORMAL LOW (ref 13.0–17.0)
MCH: 30.1 pg (ref 26.0–34.0)
MCHC: 31.2 g/dL (ref 30.0–36.0)
MCV: 96.6 fL (ref 78.0–100.0)
Platelets: 261 10*3/uL (ref 150–400)
RBC: 3.19 MIL/uL — ABNORMAL LOW (ref 4.22–5.81)
RDW: 15.7 % — ABNORMAL HIGH (ref 11.5–15.5)
WBC: 12.4 10*3/uL — ABNORMAL HIGH (ref 4.0–10.5)

## 2015-10-25 LAB — COMPREHENSIVE METABOLIC PANEL
ALT: 12 U/L — ABNORMAL LOW (ref 17–63)
AST: 35 U/L (ref 15–41)
Albumin: 2.8 g/dL — ABNORMAL LOW (ref 3.5–5.0)
Alkaline Phosphatase: 57 U/L (ref 38–126)
Anion gap: 10 (ref 5–15)
BUN: 25 mg/dL — ABNORMAL HIGH (ref 6–20)
CO2: 24 mmol/L (ref 22–32)
Calcium: 8.7 mg/dL — ABNORMAL LOW (ref 8.9–10.3)
Chloride: 101 mmol/L (ref 101–111)
Creatinine, Ser: 1.42 mg/dL — ABNORMAL HIGH (ref 0.61–1.24)
GFR calc Af Amer: 57 mL/min — ABNORMAL LOW (ref >60)
GFR calc non Af Amer: 49 mL/min — ABNORMAL LOW (ref >60)
Glucose, Bld: 149 mg/dL — ABNORMAL HIGH (ref 65–99)
Potassium: 5.7 mmol/L — ABNORMAL HIGH (ref 3.5–5.1)
Sodium: 135 mmol/L (ref 135–145)
Total Bilirubin: 0.4 mg/dL (ref 0.3–1.2)
Total Protein: 8.8 g/dL — ABNORMAL HIGH (ref 6.5–8.1)

## 2015-10-25 LAB — BASIC METABOLIC PANEL
Anion gap: 8 (ref 5–15)
BUN: 34 mg/dL — AB (ref 6–20)
CHLORIDE: 103 mmol/L (ref 101–111)
CO2: 24 mmol/L (ref 22–32)
Calcium: 8.5 mg/dL — ABNORMAL LOW (ref 8.9–10.3)
Creatinine, Ser: 1.62 mg/dL — ABNORMAL HIGH (ref 0.61–1.24)
GFR calc Af Amer: 49 mL/min — ABNORMAL LOW (ref >60)
GFR calc non Af Amer: 42 mL/min — ABNORMAL LOW (ref >60)
GLUCOSE: 94 mg/dL (ref 65–99)
POTASSIUM: 5.5 mmol/L — AB (ref 3.5–5.1)
Sodium: 135 mmol/L (ref 135–145)

## 2015-10-25 LAB — PROCALCITONIN: Procalcitonin: 0.33 ng/mL

## 2015-10-25 LAB — HEPARIN LEVEL (UNFRACTIONATED): Heparin Unfractionated: 0.38 IU/mL (ref 0.30–0.70)

## 2015-10-25 LAB — HEMOGLOBIN AND HEMATOCRIT, BLOOD
HCT: 31.6 % — ABNORMAL LOW (ref 39.0–52.0)
Hemoglobin: 9.4 g/dL — ABNORMAL LOW (ref 13.0–17.0)

## 2015-10-25 LAB — MRSA PCR SCREENING: MRSA BY PCR: NEGATIVE

## 2015-10-25 MED ORDER — BISACODYL 10 MG RE SUPP
10.0000 mg | Freq: Once | RECTAL | Status: AC
  Administered 2015-10-25: 10 mg via RECTAL
  Filled 2015-10-25: qty 1

## 2015-10-25 MED ORDER — SODIUM CHLORIDE 0.9 % IV SOLN
INTRAVENOUS | Status: DC
  Administered 2015-10-25 – 2015-10-26 (×2): via INTRAVENOUS

## 2015-10-25 MED ORDER — PANTOPRAZOLE SODIUM 40 MG IV SOLR
40.0000 mg | Freq: Two times a day (BID) | INTRAVENOUS | Status: DC
  Administered 2015-10-28 – 2015-10-29 (×3): 40 mg via INTRAVENOUS
  Filled 2015-10-25 (×4): qty 40

## 2015-10-25 MED ORDER — SODIUM CHLORIDE 0.9 % IV SOLN
8.0000 mg/h | INTRAVENOUS | Status: DC
  Administered 2015-10-25 (×2): 8 mg/h via INTRAVENOUS
  Filled 2015-10-25 (×4): qty 80

## 2015-10-25 MED ORDER — FLEET ENEMA 7-19 GM/118ML RE ENEM
1.0000 | ENEMA | Freq: Once | RECTAL | Status: AC
  Administered 2015-10-25: 1 via RECTAL
  Filled 2015-10-25: qty 1

## 2015-10-25 MED ORDER — PIPERACILLIN-TAZOBACTAM 3.375 G IVPB
3.3750 g | Freq: Three times a day (TID) | INTRAVENOUS | Status: DC
  Administered 2015-10-25 – 2015-10-26 (×5): 3.375 g via INTRAVENOUS
  Filled 2015-10-25 (×5): qty 50

## 2015-10-25 MED ORDER — PROMETHAZINE HCL 25 MG/ML IJ SOLN
12.5000 mg | Freq: Once | INTRAMUSCULAR | Status: AC
  Administered 2015-10-25: 12.5 mg via INTRAVENOUS
  Filled 2015-10-25: qty 1

## 2015-10-25 MED ORDER — CETYLPYRIDINIUM CHLORIDE 0.05 % MT LIQD
7.0000 mL | Freq: Two times a day (BID) | OROMUCOSAL | Status: DC
  Administered 2015-10-25 – 2015-10-29 (×6): 7 mL via OROMUCOSAL

## 2015-10-25 MED ORDER — SODIUM CHLORIDE 0.9 % IV SOLN
80.0000 mg | Freq: Once | INTRAVENOUS | Status: AC
  Administered 2015-10-25: 80 mg via INTRAVENOUS
  Filled 2015-10-25: qty 80

## 2015-10-25 MED ORDER — SODIUM CHLORIDE 0.9 % IV BOLUS (SEPSIS): 500.0000 mL | Freq: Once | INTRAVENOUS | Status: DC

## 2015-10-25 NOTE — Consult Note (Signed)
Referring Provider:   Dr. Randol Kern Primary Care Physician:  Jeanine Luz, FNP Primary Gastroenterologist:  Gentry Fitz  Reason for Consultation:  Coffee-ground emesis  HPI: [name] is a 70 y.o. male admitted to the hospital several days ago with respiratory insufficiency and failure to thrive in the setting of multiple severe medical problems including severe heart failure (estimated EF 20%), coronary disease, cachexia and possible urosepsis, found to have a LV thrombus and therefore started on heparin as anticoagulation.   Last night, he had coffee-ground emesis without a bump in BUN or declining hemoglobin. There has been no further recurrence of that problem.  We have been asked to help advise the treatment team, with respect to the optimal need for this patient have chronic anticoagulation, versus the occurrence of coffee-ground emesis.  The patient was not on any ulcerogenic medications prior to admission, but was on Plavix. He has never had a GI bleed, from what I can tell from chart review. The patient has altered mental status and is not able to provide a comprehensive history.  The patient is currently on a Protonix infusion.   Past Medical History  Diagnosis Date  . Coronary artery disease     s/p PCI of LAD with BMS 04/03/06 at [facility]  . Hyperlipidemia   . Hypertension   . Cerebrovascular accident [facility])     history of cerebrovascular accident with residual left hemiparesis in 2002, seen at [facility]  . Multiple sclerosis (HCC)   . Ischemic cardiomyopathy     s/p BiV ICD implanted 2007 at [facility]  . Chronic combined systolic and diastolic congestive heart failure, NYHA class 3 (HCC)   . Bipolar disorder (HCC)   . Failure to thrive (0-17)     Past Surgical History  Procedure Laterality Date  . Tonsillectomy    . Vasectomy    . Implant of automatic cardioverter defibrillator      December 26, 2004 MDT BiV ICD implanted by Dr Chales Abrahams  in [facility]  . Ptca      PCI LAD with BMS 04/03/06  . Ep implantable device N/A 01/18/2015    Procedure: BIV PPM Downgrade;  Surgeon: Hillis Range, MD;  Location: [facility] INVASIVE CV LAB;  Service: Cardiovascular;  Laterality: N/A;    Prior to Admission medications   Medication Sig Start Date End Date Taking? Authorizing Provider  atorvastatin (LIPITOR) 40 MG tablet TAKE ONE TABLET BY MOUTH ONCE DAILY 08/17/15  Yes Veryl Speak, FNP  benazepril (LOTENSIN) 20 MG tablet TAKE ONE TABLET BY MOUTH ONCE DAILY 08/06/15  Yes Veryl Speak, FNP  carvedilol (COREG) 25 MG tablet TAKE ONE TABLET BY MOUTH TWICE DAILY WITH MEALS 05/10/15  Yes Veryl Speak, FNP  clopidogrel (PLAVIX) 75 MG tablet TAKE ONE TABLET BY MOUTH ONCE DAILY 08/17/15  Yes Veryl Speak, FNP  famotidine (PEPCID) 20 MG tablet TAKE ONE TABLET BY MOUTH ONCE DAILY 08/06/15  Yes Veryl Speak, FNP  FLUoxetine (PROZAC) 20 MG capsule TAKE ONE CAPSULE BY MOUTH ONCE DAILY 08/06/15  Yes Veryl Speak, FNP  furosemide (LASIX) 20 MG tablet TAKE ONE TABLET BY MOUTH ONCE DAILY 08/17/15  Yes Veryl Speak, FNP  spironolactone (ALDACTONE) 25 MG tablet TAKE ONE TABLET BY MOUTH ONCE DAILY 08/06/15  Yes Veryl Speak, FNP  valproic acid (DEPAKENE) 250 MG capsule TAKE ONE CAPSULE BY MOUTH TWICE DAILY 01/22/15  Yes Veryl Speak, FNP  erythromycin ophthalmic ointment Place 1 application  into the right eye 4 (four) times daily. Patient not taking: Reported on 10/22/2015 08/07/14   Veryl Speak, FNP    Current Facility-Administered Medications  Medication Dose Route Frequency Provider Last Rate Last Dose  . 0.9 %  sodium chloride infusion  250 mL Intravenous PRN Briscoe Deutscher, MD      . acetaminophen (TYLENOL) tablet 650 mg  650 mg Oral Q4H PRN Elease Etienne, MD   650 mg at 10/24/15 0846  . atorvastatin (LIPITOR) tablet 40 mg  40 mg Oral Daily Briscoe Deutscher, MD   40 mg at 10/24/15 1842  . famotidine (PEPCID) tablet 20 mg  20 mg  Oral Daily Briscoe Deutscher, MD   20 mg at 10/25/15 1010  . FLUoxetine (PROZAC) capsule 20 mg  20 mg Oral Daily Briscoe Deutscher, MD   20 mg at 10/25/15 1010  . HYDROcodone-acetaminophen (NORCO/VICODIN) 5-325 MG per tablet 1 tablet  1 tablet Oral Q4H PRN Starleen Arms, MD   1 tablet at 10/24/15 1944  . ondansetron (ZOFRAN) injection 4 mg  4 mg Intravenous Q6H PRN Leanne Chang, NP   4 mg at 10/25/15 0723  . pantoprazole (PROTONIX) 80 mg in sodium chloride 0.9 % 250 mL (0.32 mg/mL) infusion  8 mg/hr Intravenous Continuous Roma Kayser Schorr, NP 25 mL/hr at 10/25/15 1000 8 mg/hr at 10/25/15 1000  . [START ON 10/28/2015] pantoprazole (PROTONIX) injection 40 mg  40 mg Intravenous Q12H Roma Kayser Schorr, NP      . piperacillin-tazobactam (ZOSYN) IVPB 3.375 g  3.375 g Intravenous 3 times per day Leanne Chang, NP   3.375 g at 10/25/15 0224  . sodium chloride 0.9 % bolus 500 mL  500 mL Intravenous Once Leanne Chang, NP   500 mL at 10/25/15 0450  . sodium chloride flush (NS) 0.9 % injection 3 mL  3 mL Intravenous Q12H Lavone Neri Opyd, MD   3 mL at 10/25/15 1013  . sodium chloride flush (NS) 0.9 % injection 3 mL  3 mL Intravenous PRN Briscoe Deutscher, MD      . valproic acid (DEPAKENE) 250 MG capsule 250 mg  250 mg Oral BID Briscoe Deutscher, MD   250 mg at 10/24/15 1032    Allergies as of 10/22/2015 - Review Complete 10/22/2015  Allergen Reaction Noted  . Meperidine hcl Other (See Comments)     Family History  Problem Relation Age of Onset  . Heart disease Mother   . Kidney disease Father   . COPD Sister   . Cancer Neg Hx   . Diabetes Neg Hx     Social History   Social History  . Marital Status: Divorced    Spouse Name: N/A  . Number of Children: N/A  . Years of Education: N/A   Occupational History  . Not on file.   Social History Main Topics  . Smoking status: Former Smoker -- 0.50 packs/day for 40 years    Types: Cigarettes    Quit date: 10/13/2011  . Smokeless  tobacco: Never Used     Comment: has tried to quit "many times" will continue to try to quit  . Alcohol Use: Yes     Comment: remote heavy ETOH, "years ago"  . Drug Use: No  . Sexual Activity: Not on file   Other Topics Concern  . Not on file   Social History Narrative   [employer]. [employer] - 09 months. Married '70's - 81yrs/divorce. Married '  80's - 10 yrs/divorced. 1 son - '71, 1 drtr '73. Has grandchildren. Lives alone. On disability.  Lives in [place].    Review of Systems: Not obtainable in terms of any accurate information, given the patient's diminished mental status.  Physical Exam: Vital signs in last 24 hours: Temp:  [97.3 F (36.3 C)-98.9 F (37.2 C)] 98.4 F (36.9 C) (02/16 0703) Pulse Rate:  [68-87] 82 (02/16 1000) Resp:  [16-25] 18 (02/16 1000) BP: (75-110)/(51-72) 88/62 mmHg (02/16 1000) SpO2:  [85 %-100 %] 100 % (02/16 1000) Weight:  [46.4 kg (102 lb 4.7 oz)] 46.4 kg (102 lb 4.7 oz) (02/16 0452) Last BM Date: 10/22/15 General:  Cachectic. No evident respiratory distress. Alert, but not able to answer questions consistently. He does have some hiccups intermittently while I am evaluating him. Head:  Normocephalic and atraumatic. Eyes:  Sclera clear, no icterus.   Conjunctiva pink. Mouth:  Very poor dentition. Does not open his mouth very wide on request, not really able to evaluate oral mucosa effectively Neck:   No masses or thyromegaly. Lungs:  Clear throughout to auscultation.   No wheezes, crackles, or rhonchi. No evident respiratory distress. Heart:   Regular rate and rhythm; no murmurs, clicks, rubs,  or gallops. Abdomen:  Soft, nontender,and nondistended. Moderate diffuse tympany. No masses, hepatosplenomegaly or ventral hernias noted. Normal bowel sounds, without bruits, guarding, or rebound.   Msk:   Symmetrical without gross deformities. Pulses:  Normal radial pulse is noted. Extremities:   Without clubbing, cyanosis, or edema. Neurologic:  Cognitively impaired,  but speech is not slurred and no overt focal neurologic deficit Skin: Multiple forearm ecchymoses Cervical Nodes:  No significant cervical adenopathy. Psych:   Alert; difficult to assess because of diminished cognition.  Intake/Output from previous day: 02/15 0701 - 02/16 0700 In: 104.2 [I.V.:54.2; IV Piggyback:50] Out: -  Intake/Output this shift: Total I/O In: 75 [I.V.:75] Out: -   Lab Results:  Recent Labs  10/22/15 1446 10/24/15 0343 10/25/15 0304  WBC 7.8 6.8 12.4*  HGB 9.5* 8.5* 9.6*  HCT 30.4* 27.7* 30.8*  PLT 243 244 261   BMET  Recent Labs  10/22/15 1622 10/24/15 0343 10/25/15 0304  NA 133* 133* 135  K 3.7 4.8 5.7*  CL 101 101 101  CO2 25 23 24   GLUCOSE 114* 107* 149*  BUN 38* 21* 25*  CREATININE 1.16 0.92 1.42*  CALCIUM 8.3* 8.5* 8.7*   LFT  Recent Labs  10/25/15 0304  PROT 8.8*  ALBUMIN 2.8*  AST 35  ALT 12*  ALKPHOS 57  BILITOT 0.4   PT/INR  Recent Labs  10/23/15 0356  LABPROT 13.0  INR 0.96    Studies/Results: Dg Chest Port 1 View  10/25/2015  CLINICAL DATA:  Aspiration. EXAM: PORTABLE CHEST 1 VIEW COMPARISON:  10/22/2015 FINDINGS: Cardiac pacemaker. Shallow inspiration. Heart size and pulmonary vascularity are normal. Emphysematous changes in the lungs. Mild patchy infiltrates demonstrated in the right upper lung and both lung bases. This is compatible with aspiration in the appropriate clinical setting. No blunting of costophrenic angles. No pneumothorax. Old appearing left rib fractures. Tortuous aorta. IMPRESSION: Emphysematous changes with patchy infiltrates in both lungs. Changes are nonspecific but compatible with aspiration in the appropriate clinical setting. Electronically Signed   By: Burman Nieves M.D.   On: 10/25/2015 01:22   Dg Abd Portable 1v  10/25/2015  CLINICAL DATA:  70 year old male with vomiting. Reason cough and shortness of breath. Initial encounter. EXAM: PORTABLE ABDOMEN - 1 VIEW COMPARISON:  Chest  radiographs from today and earlier. Most recent abdominal films 08/05/2013. FINDINGS: Portable AP supine view at 0825 hours. Gas-filled mildly to moderately dilated small bowel loops individually up to 38 mm diameter. There is colonic gas and retained stool in the descending colon and rectosigmoid colon. There is a stool ball in the rectum. No definite pneumoperitoneum on this supine view. Cardiac pacer/AICD leads. Reticulonodular opacity at the lung bases greater on the left. Osteopenia. Calcified atherosclerosis in the pelvis chronic right lower rib fractures, especially the lateral ninth and tenth ribs. IMPRESSION: 1. Positive for acute small bowel obstruction, or less likely small bowel ileus. No definite pneumoperitoneum on this supine view. 2. Stool ball in the rectum, consider fecal impaction. Electronically Signed   By: Odessa Fleming M.D.   On: 10/25/2015 08:45    Impression: 1. Transient coffee-ground emesis without frank GI bleeding or associated drop in hemoglobin or hemodynamic instability. Suspect this was mucosal hemorrhage from stress gastritis, or possibly reflux esophagitis. Doubt ulcer disease (no risk factors) 2. Possible fecal impaction (by x-ray, did not do rectal exam)  Plan: 1. I feel that it is reasonable to try ongoing anticoagulation in this patient, with clinical monitoring for evidence of overt or acute bleeding, if such anticoagulation is felt to be important clinically. 2. Because of this patient's tenuous respiratory status, severe cachexia and deconditioning, and known low cardiac ejection fraction, I would try to avoid doing nonessential endoscopic evaluation. Therefore, I think it is safer for the patient to simply try empiric anticoagulation and see what happens, rather than doing endoscopy first to verify the absence of a contraindication to anticoagulation. 3. In view of possible fecal obstipation, I will order a suppository for the patient, and if that is not effective, a  Fleet enema.   LOS: 3 days   Charleston Hankin V  10/25/2015, 11:14 AM   Pager [phone] If no answer or after 5 PM call [phone]

## 2015-10-25 NOTE — Progress Notes (Signed)
Pt complain of pain in abdomen. Norco administered. Upon reassessment pt was asleep.  During rounding pt began to vomit. Bed elevated immediately. Zofran order obtain and administered. Evening med not given due to N/V.  Pt continue to have two to three more episode of emesis. Emesis coffee ground colored.  VS taken and pt O2 saturation noted at 88% down to 76%. Oxygen was applied and pt slowly recovered to 100% on 8L.   On call provider notified and phenergan order obtain  and administered. On call provider also informed pt may have aspirated.   Chest xray was taken and on call provider came to floor to assess.   IV antibiotics started. Attempted several times to obtain a second IV access without success.  Protonix on hold until a second IV is accessed.  Second set of VS taken, BP=75/51, HR=82, RR=16, O2= 85% on 5L.  Report given to Tiffany, Charity fundraiser.    Pt transfer to stepdown.

## 2015-10-25 NOTE — Progress Notes (Signed)
eLink Physician-Brief Progress Note Patient Name: [name] DOB: Mar 12, 1946 MRN: [id number]   Date of Service  10/25/2015  HPI/Events of Note  [facility] ICU medical director rounds  Patient: [name] MR: [id number]  After reviewing the chart and discussing the patient with nursing staff in the ICU, it does not appear that the patient has a condition that requires ICU or step down level of care.    Transferred to ICU last night for possible aspiration, had a one time coffee ground emesis last night, none since.  Has had normal, non-bloody bowel movements.  Respiratory status stable.  GI saw today and felt it would be OK to start anticoagulation.  In an effort to best utilize system resources I have written orders to transfer the patient to a non-ICU/SDU inpatient unit.  Will stop pantoprazole drip and change to q12h IV dosing.  [name], MD [facility] Pager: [phone] Cell: [phone] After 3pm or if no response, call [phone]  10/25/2015@8 :13 PM       eICU Interventions       Intervention Category Minor Interventions: Clinical assessment - ordering diagnostic tests;Routine modifications to care plan (e.g. PRN medications for pain, fever)  Max Fickle 10/25/2015, 8:13 PM

## 2015-10-25 NOTE — Progress Notes (Signed)
Shift event note:  Notified by RN that pt having what appears to be brown "coffe ground" type emesis after persistent c/o's of nausea unrelieved by Zofran or phenergan. Pt had a small episode initially then a larger episode during which RN was suspicious for possible aspiration. She found pt after this episode of vomiting w/ 02 sats in the 80's then 70's. 02 sats slowly returned to baseline w/ increased 02 via [place]. BBS "gurgling". Stat PCXR requested. At bedside noted chronically ill appearing pt lying in bed in NAD. He is somewhat somnolent s/p recent IV phenergan but responds to name and opens eyes. There is obvious evidence of coffee ground emesis on clothing and facial hair. He appears somewhat pale. Though pt is slow to answer he reports feeling better and no longer reports abd pain as he had earlier in the evening. Abd is soft, non-distended and w/o appreciable TTP. Hypoactive bs. BBS initially "gurgling" in nature but improved w/ a weak cough. 02 sats initially 96% on 6L [place] but slowly trended down to high 80's-low 90's and would not increase to >90% on 6L  (w/o overt resp distress). BP's have been soft but now SBP in 70's (75/51), HR-82 and pt remains afebrile. Stat PCXR findings c/w possible aspiration. CMP/CBC pending. Assessment/Plan: 1. Aspiration: In setting of vomiting coffee ground emesis. Rocephin d/c'd. Will start Zosyn. Respiratory status remains tenuous as pt unable to keep 02 sats at 90% or greater on [facility]. 02 delivery as indicated to keep 02 sats at 92% or greater. 2. UGIB: No known h/o GIB.  Pt on Plavix prior to admission, switched to heparin drip after admission for "pre-thrombus state" of LV per echo. Discussed pt w/ Dr Onalee Hua w/ cardiology service who recommended to d/c heparin drip until further investigation of UGIB. Will start Protonix drip. If no further significant coffee ground emesis and remains stable will defer GI consult until am. Follow CBC. Transfuse if indicated.  3.  Hypotension: Not new though somewhat worse since vomiting episode. H/o chronic combined systolic/diastolic CHF and ischemic cardiomyopathy. EF 20-25% per echo on 10/24/15. Small IVF bolus x 1. Consider CCM consult if hypotension worsens. Discussed pt w/ Dr Katrinka Blazing who is in agreement w/ plan. Will transfer to SDU for closer monitoring.   Leanne Chang, NP-C [facility] Pager [phone]

## 2015-10-25 NOTE — Progress Notes (Signed)
Px has had one small formed bowel movement after suppository was given and has had two watery stools after enema was given.

## 2015-10-25 NOTE — Progress Notes (Signed)
PROGRESS NOTE    [name]  QQV:[id number]  DOB: [birth year]  DOA: 10/22/2015 PCP: Jeanine Luz, FNP Outpatient Specialists:   Hospital course: 70 year old male with history of CAD, status post PCI of LAD with BMS 04/03/06 at [facility], ischemic cardiomyopathy status post BiV ICD, chronic systolic CHF, HLD, HTN, CVA, multiple sclerosis, bipolar disorder and failure to thrive, presented to [facility] ED on 10/22/15 with history of progressive declining health over several months with weight loss and weakness and one-week history of abdominal pain, nausea, dyspnea on minimal exertion and weakness to a point where he was too weak to get out of bed. Per EDP, family no longer able to adequately care for him and requesting nursing home placement. In the ED, patient hypoxic in the mid 80s on room air, EKG showed paced rhythm and QTC 644 ms, chest x-ray demonstrated mild interstitial and pulmonary edema, urine microscopy suggestive of UTI. He was admitted for further evaluation and management of possible UTI and adult failure to thrive. Workup significant for UTI, urinary retention, elevated troponin, and echo suggestive of LV thrombus in the setting of severe ischemic cardiopathy, patient with an episode of coffee-ground emesis and aspiration pneumonia, required transfer to stepdown on 2/16.  Assessment & Plan:  UTI complicating urinary retention: -  Foley catheter placed in ED. Started empirically on IV Rocephin, urine cultures still pending, transitioned to Zosyn 2/16 for aspiration pneumonia.  Acute hypoxic respiratory failure secondary to aspiration pneumonia - Patient with an episode of coffee-ground emesis 2/16, with evidence of aspiration. - Continue with oxygen as needed, continue with nebs, and pulmonary toilet - Continue with IV Zosyn - SLP consult  Coffee-ground emesis - hemoglobin remained stable, started on Protonix drip, heparin drip was stopped, GI  consulted regarding further recommendation.   Dehydration: -  Although chest x-ray was reported as mild interstitial and pulmonary edema, patient clinically appears dehydrated. Did receive a dose of Lasix in the ED. continue to hold diuresis, continue with gentle hydration especially in the setting of low blood pressure.  Chronic systolic CHF/ischemic cardiomyopathy/CAD/ICD: - 2-D echo 2014: LVEF 20% with diffuse hypokinesis. Patient asymptomatic with no chest pain or dyspnea. Minimally elevated troponin of unclear significance and may be from demand ischemia.  - Repeat 2-D echo suggestive of LV thrombus, cardiology consulted, limited study with use of Definity echo contrast suggestive of LV thrombus, initially on heparin GTT, currently on hold due to coffee-ground emesis. - Continue to hold ischemic cardiomyopathy medication for hypertension  Essential hypertension:  - Blood pressure on the lower side, continue to hold ACEI, and beta blockers   Hyperlipidemia:   - Continue statins.  CVA:  - Hold Plavix secondary to coffee-ground emesis.  Bipolar disorder:  - Continue fluoxetine and valproate. Patient appears pleasantly confused-unclear baseline.  Anemia: -  Follow CBCs.  Prolonged QTC - : Keep potassium >4 and magnesium >2. Monitor on telemetry. Avoid QTC prolonging medications. Reviewing prior EKGs-always has had QTC prolongation-unsure if truly prolonged d/t BBB morphology  Adult failure to thrive:  - Probably multifactorial secondary to UTI, dehydration complicating multiple underlying severe comorbidities and age.  DVT prophylaxis: SCD Code Status: Full Family Communication: D/W son Via phone. Disposition Plan: To be determined. ? SNF when stable.   Consultants:  Cardiology  Gastroenterology  Procedures:  Foley catheter 2/13 >  Antimicrobials:  IV Rocephin 2/13 > 2/16  IV Zosyn 2/16  Subjective: Pleasantly confused. Episode of coffee-ground emesis  overnight, aspiration pneumonia,  transferred to stepdown  Objective: Filed Vitals:   10/25/15 0703 10/25/15 0800 10/25/15 0900 10/25/15 1000  BP:  101/71 99/71 88/62   Pulse:  72 79 82  Temp: 98.4 F (36.9 C)     TempSrc: Axillary     Resp:  17 19 18   Height:      Weight:      SpO2:  100% 100% 100%    Intake/Output Summary (Last 24 hours) at 10/25/15 1130 Last data filed at 10/25/15 1000  Gross per 24 hour  Intake 179.17 ml  Output      0 ml  Net 179.17 ml   Filed Weights   10/23/15 1054 10/24/15 0505 10/25/15 0452  Weight: 46.4 kg (102 lb 4.7 oz) 47.4 kg (104 lb 8 oz) 46.4 kg (102 lb 4.7 oz)    Exam:  General exam:chronically ill-appearing male, unkempt/disheveled, lying comfortably propped up in bed. mucosa dry.  Respiratory system: No respiratory distress, good air entry bilaterally, no wheezing. Cardiovascular system: S1 & S2 heard, RRR. No JVD, murmurs, gallops, clicks or pedal edema. Gastrointestinal system: Abdomen is nondistended, soft , mildly tender in epigastric area,. Normal bowel sounds heard.Foley catheter +.  Central nervous system: Alert and oriented only to self. No focal neurological deficits. Extremities: Symmetric 5 x 5 power.Couple of superficial abrasions on her legs.  Data Reviewed: Basic Metabolic Panel:  Recent Labs Lab 10/22/15 1622 10/23/15 1644 10/24/15 0343 10/25/15 0304  NA 133*  --  133* 135  K 3.7  --  4.8 5.7*  CL 101  --  101 101  CO2 25  --  23 24  GLUCOSE 114*  --  107* 149*  BUN 38*  --  21* 25*  CREATININE 1.16  --  0.92 1.42*  CALCIUM 8.3*  --  8.5* 8.7*  MG  --  2.0  --   --    Liver Function Tests:  Recent Labs Lab 10/22/15 1622 10/25/15 0304  AST 31 35  ALT 10* 12*  ALKPHOS 53 57  BILITOT 0.4 0.4  PROT 8.2* 8.8*  ALBUMIN 2.8* 2.8*    Recent Labs Lab 10/22/15 1622  LIPASE 44   No results for input(s): AMMONIA in the last 168 hours. CBC:  Recent Labs Lab 10/22/15 1446 10/24/15 0343 10/25/15 0304    WBC 7.8 6.8 12.4*  NEUTROABS 6.8  --   --   HGB 9.5* 8.5* 9.6*  HCT 30.4* 27.7* 30.8*  MCV 94.1 95.5 96.6  PLT 243 244 261   Cardiac Enzymes:  Recent Labs Lab 10/22/15 1622 10/23/15 1024 10/23/15 1644 10/23/15 2259  TROPONINI 0.05* 0.05* 0.04* 0.05*   BNP (last 3 results) No results for input(s): PROBNP in the last 8760 hours. CBG:  Recent Labs Lab 10/22/15 1534  GLUCAP 112*    Recent Results (from the past 240 hour(s))  Urine culture     Status: None (Preliminary result)   Collection Time: 10/22/15  9:25 PM  Result Value Ref Range Status   Specimen Description URINE, RANDOM  Bag ped  Final   Special Requests NONE  Final   Culture   Final    CULTURE REINCUBATED FOR BETTER GROWTH Performed at [facility]    Report Status PENDING  Incomplete  MRSA PCR Screening     Status: None   Collection Time: 10/25/15  4:35 AM  Result Value Ref Range Status   MRSA by PCR NEGATIVE NEGATIVE Final    Comment:  The GeneXpert MRSA Assay (FDA approved for NASAL specimens only), is one component of a comprehensive MRSA colonization surveillance program. It is not intended to diagnose MRSA infection nor to guide or monitor treatment for MRSA infections.          Studies: Dg Chest Port 1 View  10/25/2015  CLINICAL DATA:  Aspiration. EXAM: PORTABLE CHEST 1 VIEW COMPARISON:  10/22/2015 FINDINGS: Cardiac pacemaker. Shallow inspiration. Heart size and pulmonary vascularity are normal. Emphysematous changes in the lungs. Mild patchy infiltrates demonstrated in the right upper lung and both lung bases. This is compatible with aspiration in the appropriate clinical setting. No blunting of costophrenic angles. No pneumothorax. Old appearing left rib fractures. Tortuous aorta. IMPRESSION: Emphysematous changes with patchy infiltrates in both lungs. Changes are nonspecific but compatible with aspiration in the appropriate clinical setting. Electronically Signed   By:  Burman Nieves M.D.   On: 10/25/2015 01:22   Dg Abd Portable 1v  10/25/2015  CLINICAL DATA:  70 year old male with vomiting. Reason cough and shortness of breath. Initial encounter. EXAM: PORTABLE ABDOMEN - 1 VIEW COMPARISON:  Chest radiographs from today and earlier. Most recent abdominal films 08/05/2013. FINDINGS: Portable AP supine view at 0825 hours. Gas-filled mildly to moderately dilated small bowel loops individually up to 38 mm diameter. There is colonic gas and retained stool in the descending colon and rectosigmoid colon. There is a stool ball in the rectum. No definite pneumoperitoneum on this supine view. Cardiac pacer/AICD leads. Reticulonodular opacity at the lung bases greater on the left. Osteopenia. Calcified atherosclerosis in the pelvis chronic right lower rib fractures, especially the lateral ninth and tenth ribs. IMPRESSION: 1. Positive for acute small bowel obstruction, or less likely small bowel ileus. No definite pneumoperitoneum on this supine view. 2. Stool ball in the rectum, consider fecal impaction. Electronically Signed   By: Odessa Fleming M.D.   On: 10/25/2015 08:45        Scheduled Meds: . atorvastatin  40 mg Oral Daily  . famotidine  20 mg Oral Daily  . FLUoxetine  20 mg Oral Daily  . [START ON 10/28/2015] pantoprazole (PROTONIX) IV  40 mg Intravenous Q12H  . piperacillin-tazobactam (ZOSYN)  IV  3.375 g Intravenous 3 times per day  . sodium chloride  500 mL Intravenous Once  . sodium chloride flush  3 mL Intravenous Q12H  . valproic acid  250 mg Oral BID   Continuous Infusions: . sodium chloride    . pantoprozole (PROTONIX) infusion 8 mg/hr (10/25/15 1000)    Principal Problem:   Acute respiratory failure (HCC) Active Problems:   Essential hypertension   CAD S/P percutaneous coronary angioplasty   Cardiomyopathy, ischemic   Chronic combined systolic and diastolic CHF, NYHA class 3 (HCC)   Protein-calorie malnutrition, severe (HCC)   UTI (urinary tract  infection)   Acute on chronic systolic (congestive) heart failure (HCC)   Failure to thrive in adult   Complicated UTI (urinary tract infection)   Abnormal echocardiogram findings without diagnosis   LV (left ventricular) mural thrombus without MI (HCC)    Time spent: 30 minutes.    Huey Bienenstock, MD Pager [phone]  If 7PM-7AM, please contact night-coverage [email or web address] Password [email or web address] 10/25/2015, 11:30 AM    LOS: 3 days

## 2015-10-25 NOTE — Progress Notes (Signed)
Patient Name: [name] Date of Encounter: 10/25/2015  Hospital Problem List     Principal Problem:   Acute respiratory failure [facility]) Active Problems:   CAD S/P percutaneous coronary angioplasty   Cardiomyopathy, ischemic   Chronic combined systolic and diastolic CHF, NYHA class 3 (HCC)   Protein-calorie malnutrition, severe (HCC)   Failure to thrive in adult   Abnormal echocardiogram findings without diagnosis   Essential hypertension   UTI (urinary tract infection)   Acute on chronic systolic (congestive) heart failure (HCC)   Complicated UTI (urinary tract infection)    Subjective   Apparently he had a rough night last night. Was started on IV heparin after the echocardiogram was read. He then became nauseated [place] 2 episodes of emesis and likely aspiration. He was noted to be hypoxic followingemesis which suggests aspiration. At least one of the 2 episodes of emesis was noted to have coffee-ground component  Currently appears to be relatively comfortable, but is on facemask. His speech is somewhat unintelligible as he has recently received Phenergan and Norco  Inpatient Medications    . atorvastatin  40 mg Oral Daily  . famotidine  20 mg Oral Daily  . FLUoxetine  20 mg Oral Daily  . [START ON 10/28/2015] pantoprazole (PROTONIX) IV  40 mg Intravenous Q12H  . piperacillin-tazobactam (ZOSYN)  IV  3.375 g Intravenous 3 times per day  . sodium chloride  500 mL Intravenous Once  . sodium chloride flush  3 mL Intravenous Q12H  . valproic acid  250 mg Oral BID    Vital Signs    Filed Vitals:   10/25/15 0700 10/25/15 0703 10/25/15 0800 10/25/15 0900  BP: 108/72  101/71 99/71  Pulse: 71  72 79  Temp:  98.4 F (36.9 C)    TempSrc:  Axillary    Resp: Height:      Weight:      SpO2: 100%  100% 100%    Intake/Output Summary (Last 24 hours) at 10/25/15 0944 Last data filed at 10/25/15 0700  Gross per 24 hour  Intake 104.17 ml  Output      0 ml  Net  104.17 ml   Filed Weights   10/23/15 1054 10/24/15 0505 10/25/15 0452  Weight: 102 lb 4.7 oz (46.4 kg) 104 lb 8 oz (47.4 kg) 102 lb 4.7 oz (46.4 kg)    Physical Exam    General: Pleasant,moderate distress. Chronically ill-appearing but now seems to be acutely ill but nontoxic. speech is barely intelligible because of facemask; extremely frail Neuro: Alert and oriented - unable to access the extent of it orientation. Moves all extremities spontaneously. Psych: Normal affect. HEENT:  Normal  Neck: Supple without bruits or JVD. Lungs:  Resp regular and unlabored, CTA in anterior fields. Heart: RRR with normal S1 and S2. Hyperdynamic precordium likely related to thin body frame. soft holosystolic murmur at the apex Abdomen: Soft, non-tender, non-distended, BS + x 4.  Extremities: No clubbing, cyanosis or edema. DP/PT/Radials 2+ and equal bilaterally.overall general weakness left greater than rightweakness in upper 70s.  Labs    CBC  Recent Labs  10/22/15 1446 10/24/15 0343 10/25/15 0304  WBC 7.8 6.8 12.4*  NEUTROABS 6.8  --   --   HGB 9.5* 8.5* 9.6*  HCT 30.4* 27.7* 30.8*  MCV 94.1 95.5 96.6  PLT 243 244 261   Basic Metabolic Panel  Recent Labs  10/23/15 1644 10/24/15 0343 10/25/15 0304  NA  --  133* 135  K  --  4.8 5.7*  CL  --  101 101  CO2  --  23 24  GLUCOSE  --  107* 149*  BUN  --  21* 25*  CREATININE  --  0.92 1.42*  CALCIUM  --  8.5* 8.7*  MG 2.0  --   --    Liver Function Tests  Recent Labs  10/22/15 1622 10/25/15 0304  AST 31 35  ALT 10* 12*  ALKPHOS 53 57  BILITOT 0.4 0.4  PROT 8.2* 8.8*  ALBUMIN 2.8* 2.8*    Recent Labs  10/22/15 1622  LIPASE 44   Cardiac Enzymes  Recent Labs  10/23/15 1024 10/23/15 1644 10/23/15 2259  TROPONINI 0.05* 0.04* 0.05*   BNP Invalid input(s): POCBNP D-Dimer No results for input(s): DDIMER in the last 72 hours. Hemoglobin A1C No results for input(s): HGBA1C in the last 72 hours. Fasting Lipid  Panel No results for input(s): CHOL, HDL, LDLCALC, TRIG, CHOLHDL, LDLDIRECT in the last 72 hours. Thyroid Function Tests No results for input(s): TSH, T4TOTAL, T3FREE, THYROIDAB in the last 72 hours.  Invalid input(s): FREET3  Telemetry    A-V Paced   ECG    No new EKG  Radiology / Cardiology    Limited Echo with Definity 10/24/15: - Left ventricle: The cavity size was normal. Systolic function was severely reduced. The estimated ejection fraction was in the range of 20% to 25%. - Aortic valve: Trileaflet. - This was a limited study with use of Definity echo contrast for the evaluation of the apical thrombus.  There is no definite thrombus but heavy sludge and layering of the blood is present in the akinetic apex. This is considered a pre-thrombotic stage and should be treated with full anticoagulation.  Dg Chest Port 1 View  10/25/2015  CLINICAL DATA:  Aspiration. EXAM: PORTABLE CHEST 1 VIEW COMPARISON:  10/22/2015 FINDINGS: Cardiac pacemaker. Shallow inspiration. Heart size and pulmonary vascularity are normal. Emphysematous changes in the lungs. Mild patchy infiltrates demonstrated in the right upper lung and both lung bases. This is compatible with aspiration in the appropriate clinical setting. No blunting of costophrenic angles. No pneumothorax. Old appearing left rib fractures. Tortuous aorta. IMPRESSION: Emphysematous changes with patchy infiltrates in both lungs. Changes are nonspecific but compatible with aspiration in the appropriate clinical setting. Electronically Signed   By: Burman Nieves M.D.   On: 10/25/2015 01:22   10/25/2015  CLINICAL DATA:  70 year old male with vomiting. Reason cough and shortness of breath. Initial encounter. EXAM: PORTABLE ABDOMEN - 1 VIEW COMPARISON:  Chest radiographs from today and earlier. Most recent abdominal films 08/05/2013. FINDINGS: Portable AP supine view at 0825 hours. Gas-filled mildly to moderately dilated small bowel loops individually  up to 38 mm diameter. There is colonic gas and retained stool in the descending colon and rectosigmoid colon. There is a stool ball in the rectum. No definite pneumoperitoneum on this supine view. Cardiac pacer/AICD leads. Reticulonodular opacity at the lung bases greater on the left. Osteopenia. Calcified atherosclerosis in the pelvis chronic right lower rib fractures, especially the lateral ninth and tenth ribs. IMPRESSION: 1. Positive for acute small bowel obstruction, or less likely small bowel ileus. No definite pneumoperitoneum on this supine view. 2. Stool ball in the rectum, consider fecal impaction. Electronically Signed   By: Odessa Fleming M.D.   On: 10/25/2015 08:45    Assessment & Plan   Principal Problem:   Acute respiratory failure [facility]) Active Problems:   CAD  S/P percutaneous coronary angioplasty   Cardiomyopathy, ischemic   Chronic combined systolic and diastolic CHF, NYHA class 3 (HCC)   Protein-calorie malnutrition, severe (HCC)   Failure to thrive in adult   Abnormal echocardiogram findings without diagnosis   Essential hypertension   UTI (urinary tract infection)   Acute on chronic systolic (congestive) heart failure (HCC)   Complicated UTI (urinary tract infection)  His acute respiratory failure is clearly not related to heart failure. It is clearly related to probable aspiration. No active anginal or heart failure symptoms.  The patient has a known severe cardiomyopathy with EF in 20-25% range.  He had an echocardiogram yesterday suggestive of left ventricular thrombus.  Limited echo with Definity contrast confirms the possibility of pre-thrombotic state.  Recommendation was for anticoagulation.  At present, there is more concern for emesis with some coffee-ground component. I agree with holding off on full anticoagulation until we have confirmed that he has no active bleeding.  He is hypotensive, therefore I would hold his heart failure medications until stabilized. Would  then restart beta blocker first at lower dose. This can be titrated back up as an outpatient.  At present, no active cardiac issues. There is no active data on DOAC therapy for lV thrombus, therefore treatment option is really limited to warfarin.Once we are sure that he is not actively bleeding, would restart IV heparin-warfarin bridge.  Target INR will be 2.0-2.5.  As his blood pressure begins to normalize, would gradually restart his CHF medications.  He will need close follow-up with his cardiologist upon discharge to further titrate medications.  Signed, Marykay Lex, M.D., M.S. Interventional Cardiologist   Pager # [phone] Phone # [phone] [address] [place], [place] [address]

## 2015-10-25 NOTE — Telephone Encounter (Signed)
Spoke with pts friend about what was going on and told her per Tammy Sours to request to speak with social workers at the hospital to let them know situation.

## 2015-10-26 DIAGNOSIS — E43 Unspecified severe protein-calorie malnutrition: Secondary | ICD-10-CM

## 2015-10-26 LAB — CBC
HCT: 28.6 % — ABNORMAL LOW (ref 39.0–52.0)
Hemoglobin: 8.8 g/dL — ABNORMAL LOW (ref 13.0–17.0)
MCH: 29.4 pg (ref 26.0–34.0)
MCHC: 30.8 g/dL (ref 30.0–36.0)
MCV: 95.7 fL (ref 78.0–100.0)
PLATELETS: 233 10*3/uL (ref 150–400)
RBC: 2.99 MIL/uL — ABNORMAL LOW (ref 4.22–5.81)
RDW: 15.7 % — AB (ref 11.5–15.5)
WBC: 9.6 10*3/uL (ref 4.0–10.5)

## 2015-10-26 LAB — URINE CULTURE

## 2015-10-26 LAB — BASIC METABOLIC PANEL
ANION GAP: 8 (ref 5–15)
BUN: 36 mg/dL — ABNORMAL HIGH (ref 6–20)
CO2: 24 mmol/L (ref 22–32)
CREATININE: 1.5 mg/dL — AB (ref 0.61–1.24)
Calcium: 8.4 mg/dL — ABNORMAL LOW (ref 8.9–10.3)
Chloride: 104 mmol/L (ref 101–111)
GFR, EST AFRICAN AMERICAN: 53 mL/min — AB (ref >60)
GFR, EST NON AFRICAN AMERICAN: 46 mL/min — AB (ref >60)
GLUCOSE: 76 mg/dL (ref 65–99)
Potassium: 5.1 mmol/L (ref 3.5–5.1)
Sodium: 136 mmol/L (ref 135–145)

## 2015-10-26 LAB — HEPARIN LEVEL (UNFRACTIONATED): Heparin Unfractionated: 0.34 IU/mL (ref 0.30–0.70)

## 2015-10-26 MED ORDER — TAMSULOSIN HCL 0.4 MG PO CAPS
0.4000 mg | ORAL_CAPSULE | Freq: Every day | ORAL | Status: DC
  Administered 2015-10-26 – 2015-10-28 (×3): 0.4 mg via ORAL
  Filled 2015-10-26 (×4): qty 1

## 2015-10-26 MED ORDER — DEXTROSE 5 % IV SOLN
1.0000 g | INTRAVENOUS | Status: DC
  Administered 2015-10-26 – 2015-10-28 (×3): 1 g via INTRAVENOUS
  Filled 2015-10-26 (×4): qty 10

## 2015-10-26 MED ORDER — MINERAL OIL RE ENEM
1.0000 | ENEMA | Freq: Once | RECTAL | Status: AC
  Administered 2015-10-26: 1 via RECTAL
  Filled 2015-10-26: qty 1

## 2015-10-26 MED ORDER — POLYETHYLENE GLYCOL 3350 17 G PO PACK
17.0000 g | PACK | Freq: Two times a day (BID) | ORAL | Status: DC
  Administered 2015-10-26 – 2015-10-29 (×4): 17 g via ORAL
  Filled 2015-10-26 (×8): qty 1

## 2015-10-26 MED ORDER — METRONIDAZOLE IN NACL 5-0.79 MG/ML-% IV SOLN
500.0000 mg | Freq: Three times a day (TID) | INTRAVENOUS | Status: DC
  Administered 2015-10-26 – 2015-10-29 (×9): 500 mg via INTRAVENOUS
  Filled 2015-10-26 (×10): qty 100

## 2015-10-26 MED ORDER — MILK AND MOLASSES ENEMA
1.0000 | Freq: Once | RECTAL | Status: AC
  Administered 2015-10-26: 250 mL via RECTAL
  Filled 2015-10-26: qty 250

## 2015-10-26 MED ORDER — HEPARIN (PORCINE) IN NACL 100-0.45 UNIT/ML-% IJ SOLN
600.0000 [IU]/h | INTRAMUSCULAR | Status: DC
  Administered 2015-10-26: 600 [IU]/h via INTRAVENOUS
  Filled 2015-10-26: qty 250

## 2015-10-26 MED ORDER — SACCHAROMYCES BOULARDII 250 MG PO CAPS
250.0000 mg | ORAL_CAPSULE | Freq: Two times a day (BID) | ORAL | Status: DC
  Administered 2015-10-26 – 2015-10-29 (×7): 250 mg via ORAL
  Filled 2015-10-26 (×8): qty 1

## 2015-10-26 NOTE — Progress Notes (Signed)
ANTICOAGULATION CONSULT NOTE - Initial Consult  Pharmacy Consult for Heparin Indication: apical thrombus  Allergies  Allergen Reactions  . Meperidine Hcl Other (See Comments)    unknown    Patient Measurements: Height: 5\' 9"  (175.3 cm) Weight: 107 lb 9.4 oz (48.8 kg) IBW/kg (Calculated) : 70.7  Vital Signs: Temp: 98.1 F (36.7 C) (02/17 0451) Temp Source: Oral (02/17 0451) BP: 106/66 mmHg (02/17 0451) Pulse Rate: 64 (02/17 0451)  Labs:  Recent Labs  10/23/15 1024 10/23/15 1644 10/23/15 2259  10/24/15 0343 10/25/15 0304 10/25/15 1428 10/26/15 0548  HGB  --   --   --   < > 8.5* 9.6* 9.4* 8.8*  HCT  --   --   --   < > 27.7* 30.8* 31.6* 28.6*  PLT  --   --   --   --  244 261  --  233  HEPARINUNFRC  --   --   --   --   --  0.38  --   --   CREATININE  --   --   --   --  0.92 1.42* 1.62*  --   TROPONINI 0.05* 0.04* 0.05*  --   --   --   --   --   < > = values in this interval not displayed.  Estimated Creatinine Clearance: 30.1 mL/min (by C-G formula based on Cr of 1.62).   Medical History: Past Medical History  Diagnosis Date  . Coronary artery disease     s/p PCI of LAD with BMS 04/03/06 at [facility]  . Hyperlipidemia   . Hypertension   . Cerebrovascular accident [facility])     history of cerebrovascular accident with residual left hemiparesis in 2002, seen at [facility]  . Multiple sclerosis (HCC)   . Ischemic cardiomyopathy     s/p BiV ICD implanted 2007 at [facility]  . Chronic combined systolic and diastolic congestive heart failure, NYHA class 3 (HCC)   . Bipolar disorder (HCC)   . Failure to thrive (0-17)     Medications:  Infusions:  . sodium chloride 75 mL/hr at 10/25/15 1217    Assessment: 71 yo M admitted on 2/14 with chronic HF admitted with FTT and UTI.  He was found to have LV thrombus per ECHO. Pharmacy asked to initiate heparin infusion on 2/15.  Later that evening, patient feveloped hematemesis, so heparin gtt held.   Seen by GI - felt to be mucosal hemorrhage from stress gastritis or possibly reflux esophagtis; states reasonable to retry [facility].    2/17 AM: Pharmacy re-consulted to resume heparin without a bolus.     Goal of Therapy:  Heparin level 0.3-0.7 units/ml - will aim for low end given recent hematemesis Monitor platelets by anticoagulation protocol: Yes   Plan:   No heparin bolus  Start heparin infusion at 600 units/hr  Check anti-Xa level in 8 hours and daily while on heparin  Continue to monitor H&H and platelets  Loralee Pacas, PharmD, BCPS Pager: [phone] 10/26/2015,8:06 AM

## 2015-10-26 NOTE — Progress Notes (Signed)
   Discussed with [facility] Attending --> restart anticoagulation heparin-warfarin to Rx for minimum of 3 months given ~LV thrombus.  Will need f/u with Cardiology (Dr. Johney Frame) to reassess Echo.  Currently most of his Cardiomyopathy meds are on hold due to hypotension. -- as BP stabilizes would gradually start back meds with Carvedilol (~3.25 bid) 1st.  Continue Statin.  Will sign off for now.  Call with ?s  Marykay Lex, MD

## 2015-10-26 NOTE — Evaluation (Signed)
Clinical/Bedside Swallow Evaluation Patient Details  Name: [name] MRN: [id number] Date of Birth: 10-27-1945  Today's Date: 10/26/2015 Time: SLP Start Time (ACUTE ONLY): 1225 SLP Stop Time (ACUTE ONLY): 1250 SLP Time Calculation (min) (ACUTE ONLY): 25 min  Past Medical History:  Past Medical History  Diagnosis Date  . Coronary artery disease     s/p PCI of LAD with BMS 04/03/06 at [facility]  . Hyperlipidemia   . Hypertension   . Cerebrovascular accident [facility])     history of cerebrovascular accident with residual left hemiparesis in 2002, seen at [facility]  . Multiple sclerosis (HCC)   . Ischemic cardiomyopathy     s/p BiV ICD implanted 2007 at [facility]  . Chronic combined systolic and diastolic congestive heart failure, NYHA class 3 (HCC)   . Bipolar disorder (HCC)   . Failure to thrive (0-17)    Past Surgical History:  Past Surgical History  Procedure Laterality Date  . Tonsillectomy    . Vasectomy    . Implant of automatic cardioverter defibrillator      December 26, 2004 MDT BiV ICD implanted by Dr Chales Abrahams in [facility]  . Ptca      PCI LAD with BMS 04/03/06  . Ep implantable device N/A 01/18/2015    Procedure: BIV PPM Downgrade;  Surgeon: Hillis Range, MD;  Location: [facility] INVASIVE CV LAB;  Service: Cardiovascular;  Laterality: N/A;   HPI:  70 y.o. male with PMH of ischemic cardiomyopathy with stents and pacer placement in 2007, hypertension, and remote CVA who presented to the ED with 1 week of abdominal pain, nausea, generalized weakness, and shortness of breath. Patient has apparently been declining in health over several months with weight loss and progressive weakness   Assessment / Plan / Recommendation Clinical Impression  Patient presents with a mild oral dysphagia characterized by decreased mastication and oral transit of regular solid texture bolus, such that patient had to spit out portion of piece of melon that he was eating. Patient  exhibits oral weakness, and decreased left-sided labial movements that is likely from remote CVA. Patient did not exhbit any overt s/s of aspiration, swallow initiation was timely and he had good laryngeal elevation per palpation.     Aspiration Risk  Mild aspiration risk    Diet Recommendation Dysphagia 3 (Mech soft);Thin liquid   Liquid Administration via: Cup;Straw Medication Administration: Whole meds with liquid Supervision: Staff to assist with self feeding;Full supervision/cueing for compensatory strategies Compensations: Minimize environmental distractions;Slow rate;Small sips/bites Postural Changes: Seated upright at 90 degrees    Other  Recommendations Oral Care Recommendations: Oral care BID   Follow up Recommendations  None    Frequency and Duration min 2x/week  2 weeks       Prognosis Prognosis for Safe Diet Advancement: Good      Swallow Study   General Date of Onset: 10/22/15 HPI: 70 y.o. male with PMH of ischemic cardiomyopathy with stents and pacer placement in 2007, hypertension, and remote CVA who presented to the ED with 1 week of abdominal pain, nausea, generalized weakness, and shortness of breath. Patient has apparently been declining in health over several months with weight loss and progressive weakness Type of Study: Bedside Swallow Evaluation Previous Swallow Assessment: N/A Diet Prior to this Study: Regular;Thin liquids Temperature Spikes Noted: No Respiratory Status: Nasal cannula History of Recent Intubation: No Behavior/Cognition: Alert;Cooperative;Confused;Requires cueing Oral Care Completed by SLP: No Oral Cavity - Dentition: Poor condition;Missing dentition Vision:  Impaired for self-feeding (would frequently aim too high when trying to bring cup to mouth ) Self-Feeding Abilities: Needs assist;Needs set up Patient Positioning: Upright in bed Baseline Vocal Quality: Low vocal intensity Volitional Cough: Weak Volitional Swallow: Unable to  elicit    Oral/Motor/Sensory Function Overall Oral Motor/Sensory Function: Mild impairment Facial ROM: Reduced left Facial Symmetry: Abnormal symmetry left Facial Strength: Reduced left Lingual ROM: Reduced right;Reduced left Lingual Symmetry: Within Functional Limits Lingual Strength: Reduced   Ice Chips     Thin Liquid Thin Liquid: Within functional limits Presentation: Cup;Straw    Nectar Thick     Honey Thick     Puree Puree: Impaired Oral Phase Impairments: Reduced labial seal   Solid   GO   Solid: Impaired Oral Phase Impairments: Impaired mastication Oral Phase Functional Implications: Prolonged oral transit;Impaired mastication        Pablo Lawrence 10/26/2015,3:47 PM   Angela Nevin, MA, CCC-SLP 10/26/2015 3:47 PM

## 2015-10-26 NOTE — Progress Notes (Signed)
CSW continuing to follow.   CSW followed up with pt at bedside and pt friend, Lindell Noe present at this time.   Pt alert and oriented, but speech slurred and fell asleep. Pt friend, Lindell Noe has been primary caregiver for pt at home. Pt friend reports that pt son, Casimiro Needle lives in [place], but is involved.   CSW discussed with pt friend re: SNF bed offers. CSW clarified questions and concerns. Pt friend discussed that she will tour a few facilities and discuss with pt son in order for decision to be made. Pt friend anticipates that pt will need long term care at the facility. Pt friend stated that [facility] and [facility] are very close to pt friend home and would be easy for her to get to pt to visit.   CSW contacted pt son, Casimiro Needle via telephone. CSW introduced self and explained role. Pt son states that he will be [place] tomorrow and plans to come to hospital. CSW discussed with pt son need for SNF and likely long term care. CSW discussed with pt son the importance of applying for Medicaid for pt as currently pt only has insurance to cover a short time at [facility] and Medicaid would be able to cover long term. CSW provided information for applying for Medicaid in pt room. Pt son plans to review offers with pt friend, Lindell Noe in order to make decision about SNF and plans to work toward applying for Medicaid.   CSW to follow up re: decision for SNF.  CSW to continue to follow to provide support and assist with pt disposition needs.   Loletta Specter, MSW, LCSW Clinical Social Work [employer] [phone]

## 2015-10-26 NOTE — Progress Notes (Signed)
ANTICOAGULATION CONSULT NOTE - Initial Consult  Pharmacy Consult for Heparin Indication: apical thrombus  Allergies  Allergen Reactions  . Meperidine Hcl Other (See Comments)    unknown    Patient Measurements: Height: 5\' 9"  (175.3 cm) Weight: 107 lb 9.4 oz (48.8 kg) IBW/kg (Calculated) : 70.7  Vital Signs: Temp: 98.1 F (36.7 C) (02/17 1434) Temp Source: Oral (02/17 1434) BP: 92/57 mmHg (02/17 1434) Pulse Rate: 65 (02/17 1434)  Labs:  Recent Labs  10/23/15 2259  10/24/15 0343 10/25/15 0304 10/25/15 1428 10/26/15 0548 10/26/15 1704  HGB  --   < > 8.5* 9.6* 9.4* 8.8*  --   HCT  --   < > 27.7* 30.8* 31.6* 28.6*  --   PLT  --   --  244 261  --  233  --   HEPARINUNFRC  --   --   --  0.38  --   --  0.34  CREATININE  --   < > 0.92 1.42* 1.62* 1.50*  --   TROPONINI 0.05*  --   --   --   --   --   --   < > = values in this interval not displayed.  Estimated Creatinine Clearance: 32.5 mL/min (by C-G formula based on Cr of 1.5).   Medical History: Past Medical History  Diagnosis Date  . Coronary artery disease     s/p PCI of LAD with BMS 04/03/06 at [facility]  . Hyperlipidemia   . Hypertension   . Cerebrovascular accident [facility])     history of cerebrovascular accident with residual left hemiparesis in 2002, seen at [facility]  . Multiple sclerosis (HCC)   . Ischemic cardiomyopathy     s/p BiV ICD implanted 2007 at [facility]  . Chronic combined systolic and diastolic congestive heart failure, NYHA class 3 (HCC)   . Bipolar disorder (HCC)   . Failure to thrive (0-17)     Medications:  Infusions:  . sodium chloride 75 mL/hr at 10/26/15 1147  . heparin 600 Units/hr (10/26/15 0841)    Assessment: 70 yo M admitted on 2/14 with chronic HF admitted with FTT and UTI.  He was found to have LV thrombus per ECHO. Pharmacy asked to initiate heparin infusion on 2/15.  Later that evening, patient feveloped hematemesis, so heparin gtt held.  Seen  by GI - felt to be mucosal hemorrhage from stress gastritis or possibly reflux esophagtis; states reasonable to retry [facility].    2/17 AM: Pharmacy re-consulted to resume heparin without a bolus.     Goal of Therapy:  Heparin level 0.3-0.7 units/ml - will aim for low end given recent hematemesis Monitor platelets by anticoagulation protocol: Yes   Plan:   No heparin bolus  Start heparin infusion at 600 units/hr  Check anti-Xa level in 8 hours and daily while on heparin  Continue to monitor H&H and platelets   2/17 anti-Xa level is 0.34. Will continue current and rate and check HL with AM labs.   Adalberto Cole, PharmD, BCPS Pager [phone] 10/26/2015 7:09 PM

## 2015-10-26 NOTE — Care Management Important Message (Signed)
Important Message  Patient Details  Name: [name] MRN: [id number] Date of Birth: 03-Nov-1945   Medicare Important Message Given:  Yes    Haskell Flirt 10/26/2015, 1:00 PMImportant Message  Patient Details  Name: [name] MRN: [id number] Date of Birth: 12/17/1945   Medicare Important Message Given:  Yes    Haskell Flirt 10/26/2015, 1:00 PM

## 2015-10-26 NOTE — Progress Notes (Signed)
PT Cancellation Note  Patient Details Name: CORDARRIUS CORPE MRN: [id number] DOB: 1946-03-26   Cancelled Treatment:     RN reports pt requires total care.  Currently in bed + 2 assist for incont bm/urine.  Plans are for SNF.    Armando Reichert 10/26/2015, 3:14 PM

## 2015-10-26 NOTE — Clinical Social Work Placement (Signed)
   CLINICAL SOCIAL WORK PLACEMENT  NOTE  Date:  10/26/2015  Patient Details  Name: [name] MRN: [id number] Date of Birth: [birth year]  Clinical Social Work is seeking post-discharge placement for this patient at the Skilled  Nursing Facility level of care (*CSW will initial, date and re-position this form in  chart as items are completed):  Yes   Patient/family provided with [facility] Clinical Social Work Department's list of facilities offering this level of care within the geographic area requested by the patient (or if unable, by the patient's family).  Yes   Patient/family informed of their freedom to choose among providers that offer the needed level of care, that participate in Medicare, Medicaid or managed care program needed by the patient, have an available bed and are willing to accept the patient.  Yes   Patient/family informed of [facility]'s ownership interest in [facility] and [facility], as well as of the fact that they are under no obligation to receive care at these facilities.  PASRR submitted to EDS on       PASRR number received on       Existing PASRR number confirmed on 10/24/15     FL2 transmitted to all facilities in geographic area requested by pt/family on 10/24/15     FL2 transmitted to all facilities within larger geographic area on       Patient informed that his/her managed care company has contracts with or will negotiate with certain facilities, including the following:        Yes   Patient/family informed of bed offers received.  Patient chooses bed at       Physician recommends and patient chooses bed at      Patient to be transferred to   on  .  Patient to be transferred to facility by       Patient family notified on   of transfer.  Name of family member notified:        PHYSICIAN Please sign FL2     Additional Comment:    _______________________________________________ Orson Eva, LCSW 10/26/2015, 3:08  PM

## 2015-10-26 NOTE — Progress Notes (Signed)
Mild decline in hemoglobin from 9.8-8.8 over the past 24 hours, but is far as I'm aware, there has not been any further coffee-ground emesis.  From the lower tract perspective, he had minimal results from his suppository and enema.  Exam: The patient is lying in bed in absolutely no distress. He is awake and alert, but not completely coherent. The abdomen is somewhat firm, but nontender. I did a rectal exam which shows multiple sacral decubiti covered with bandages. Sphincter tone is normal; he has a very large, firm but not rockhard fecal impaction, about the size of a tennis ball or even perhaps a grapefruit. I digitally extracted about 200 ML of stool and broke up the impaction as much as I could. However, there was still a fair amount of stool present out of reach of my finger. The stool was brown in color, although in areas, it had a slight maroon tint, raising the question of perhaps some stercoral ulceration.  Impression:  1. Severely debilitated, cognitively impaired patient with severe CHF and cachexia 2. Left ventricular thrombus, heparin being restarted this morning as per my recommendation from yesterday 3. Fecal impaction, partially resolved with digital disimpaction  Plan:  1. Monitor for bleeding with reinitiation of heparin 2. Avoid nonessential endoscopy, because of patient's tenuous medical status 3. Begin MiraLAX 4. Speech therapy consultation because of question of aspiration pneumonia when he presented to the hospital 5. Enemas today, with follow-up KUB tomorrow to assess amount of residual stool.  Florencia Reasons, M.D. Pager [phone] If no answer or after 5 PM call [phone]

## 2015-10-26 NOTE — Progress Notes (Signed)
Patient had large semi formed bowel movement, after enemas.

## 2015-10-26 NOTE — Progress Notes (Signed)
PROGRESS NOTE    [name]  UMP:[id number]  DOB: 01/12/1946  DOA: 10/22/2015 PCP: Jeanine Luz, FNP Outpatient Specialists:   Hospital course: 70 year old male with history of CAD, status post PCI of LAD with BMS 04/03/06 at [facility], ischemic cardiomyopathy status post BiV ICD, chronic systolic CHF, HLD, HTN, CVA, multiple sclerosis, bipolar disorder and failure to thrive, presented to [facility] ED on 10/22/15 with history of progressive declining health over several months with weight loss and weakness and one-week history of abdominal pain, nausea, dyspnea on minimal exertion and weakness to a point where he was too weak to get out of bed. Per EDP, family no longer able to adequately care for him and requesting nursing home placement. In the ED, patient hypoxic in the mid 80s on room air, EKG showed paced rhythm and QTC 644 ms, chest x-ray demonstrated mild interstitial and pulmonary edema, urine microscopy suggestive of UTI. He was admitted for further evaluation and management of possible UTI and adult failure to thrive. Workup significant for UTI, urinary retention, elevated troponin, and echo suggestive of LV thrombus in the setting of severe ischemic cardiopathy, patient with an episode of coffee-ground emesis and aspiration pneumonia, required transfer to stepdown on 2/16.  Assessment & Plan:  UTI complicating urinary retention: -  Foley catheter placed in ED. Started empirically on IV Rocephin,transitioned to Zosyn 2/16 for aspiration pneumonia, urine culture growing Escherichia coli intermediate sensitivity to Zosyn, so switched back to Rocephin. - Started on Flomax - Voiding trial and a.m.  Acute hypoxic respiratory failure secondary to aspiration pneumonia - Patient with an episode of coffee-ground emesis 2/16, with evidence of aspiration. - Continue with oxygen as needed, continue with nebs, and pulmonary toilet -On IV Zosyn, transitioned to  Rocephin and Flagyl given urine sensitivities - SLP consultAppreciated, on dysphagia 3 diet  Coffee-ground emesis - hemoglobin remained stable, consult appreciated, most likely due to stress gastritis, but no evidence of overt bleeding, no recurrence, at this point they recommend no active workup, observe on anticoagulation.   Dehydration: -  Although chest x-ray was reported as mild interstitial and pulmonary edema, patient clinically appears dehydrated. Did receive a dose of Lasix in the ED. continue to hold diuresis, continue with gentle hydration especially in the setting of low blood pressure.  Chronic systolic CHF/ischemic cardiomyopathy/CAD/ICD: - 2-D echo 2014: LVEF 20% with diffuse hypokinesis. Patient asymptomatic with no chest pain or dyspnea. Minimally elevated troponin of unclear significance and may be from demand ischemia.  - Repeat 2-D echo suggestive of LV thrombus, cardiology consulted, limited study with use of Definity echo contrast suggestive of LV thrombus, initially on heparin GTTon hold due to 1 episode of coffee-ground emesis, back on anticoagulation, discussed with Dr. Herbie Baltimore, will need anticoagulation for at least 3 month . - Continue to hold ischemic cardiomyopathy medication for hypertension  Essential hypertension:  - Blood pressure on the lower side, continue to hold ACEI, and beta blockers   Hyperlipidemia:   - Continue statins.  CVA:  - Hold Plavix secondary to coffee-ground emesis.  Bipolar disorder:  - Continue fluoxetine and valproate. Patient appears pleasantly confused-unclear baseline.  Anemia: -  Follow CBCs.  Prolonged QTC - : Keep potassium >4 and magnesium >2. Monitor on telemetry. Avoid QTC prolonging medications. Reviewing prior EKGs-always has had QTC prolongation-unsure if truly prolonged d/t BBB morphology  Adult failure to thrive:  - Probably multifactorial secondary to UTI, dehydration complicating multiple underlying severe  comorbidities and age.  DVT prophylaxis: SCD Code Status: Full Family Communication: none at bedside Disposition  Plan: To be determined. ? SNF when stable.   Consultants:  Cardiology  Gastroenterology  Procedures:  Foley catheter 2/13 >  Antimicrobials:  IV Rocephin 2/13 > 2/16  IV Zosyn 2/16  Subjective: Pleasantly confused.Denies any complaints  Objective: Filed Vitals:   10/26/15 0016 10/26/15 0123 10/26/15 0451 10/26/15 1434  BP:  98/59 106/66 92/57  Pulse:  72 64 65  Temp: 97.7 F (36.5 C) 98.6 F (37 C) 98.1 F (36.7 C) 98.1 F (36.7 C)  TempSrc: Axillary Oral Oral Oral  Resp:  18 18 16   Height:      Weight:   48.8 kg (107 lb 9.4 oz)   SpO2:  94% 98% 95%    Intake/Output Summary (Last 24 hours) at 10/26/15 1606 Last data filed at 10/26/15 0849  Gross per 24 hour  Intake 1311.25 ml  Output      0 ml  Net 1311.25 ml   Filed Weights   10/24/15 0505 10/25/15 0452 10/26/15 0451  Weight: 47.4 kg (104 lb 8 oz) 46.4 kg (102 lb 4.7 oz) 48.8 kg (107 lb 9.4 oz)    Exam:  General exam:chronically ill-appearing male, unkempt/disheveled, lying comfortably propped up in bed. mucosa dry.  Respiratory system: No respiratory distress, good air entry bilaterally, no wheezing. Cardiovascular system: S1 & S2 heard, RRR. No JVD, murmurs, gallops, clicks or pedal edema. Gastrointestinal system: Abdomen is nondistended, soft , mildly tender in epigastric area,. Normal bowel sounds heard.Foley catheter +.  Central nervous system: Alert and oriented only to self. No focal neurological deficits. Extremities: Symmetric 5 x 5 power.Couple of superficial abrasions on her legs.  Data Reviewed: Basic Metabolic Panel:  Recent Labs Lab 10/22/15 1622 10/23/15 1644 10/24/15 0343 10/25/15 0304 10/25/15 1428 10/26/15 0548  NA 133*  --  133* 135 135 136  K 3.7  --  4.8 5.7* 5.5* 5.1  CL 101  --  101 101 103 104  CO2 25  --  23 24 24 24   GLUCOSE 114*  --  107* 149* 94 76   BUN 38*  --  21* 25* 34* 36*  CREATININE 1.16  --  0.92 1.42* 1.62* 1.50*  CALCIUM 8.3*  --  8.5* 8.7* 8.5* 8.4*  MG  --  2.0  --   --   --   --    Liver Function Tests:  Recent Labs Lab 10/22/15 1622 10/25/15 0304  AST 31 35  ALT 10* 12*  ALKPHOS 53 57  BILITOT 0.4 0.4  PROT 8.2* 8.8*  ALBUMIN 2.8* 2.8*    Recent Labs Lab 10/22/15 1622  LIPASE 44   No results for input(s): AMMONIA in the last 168 hours. CBC:  Recent Labs Lab 10/22/15 1446 10/24/15 0343 10/25/15 0304 10/25/15 1428 10/26/15 0548  WBC 7.8 6.8 12.4*  --  9.6  NEUTROABS 6.8  --   --   --   --   HGB 9.5* 8.5* 9.6* 9.4* 8.8*  HCT 30.4* 27.7* 30.8* 31.6* 28.6*  MCV 94.1 95.5 96.6  --  95.7  PLT 243 244 261  --  233   Cardiac Enzymes:  Recent Labs Lab 10/22/15 1622 10/23/15 1024 10/23/15 1644 10/23/15 2259  TROPONINI 0.05* 0.05* 0.04* 0.05*   BNP (last 3 results) No results for input(s): PROBNP in the last 8760 hours. CBG:  Recent Labs Lab 10/22/15 1534  GLUCAP 112*    Recent Results (from the past  240 hour(s))  Urine culture     Status: None   Collection Time: 10/22/15  9:25 PM  Result Value Ref Range Status   Specimen Description URINE, RANDOM  Bag ped  Final   Special Requests NONE  Final   Culture   Final    >=100,000 COLONIES/mL ESCHERICHIA COLI Performed at [facility]    Report Status 10/26/2015 FINAL  Final   Organism ID, Bacteria ESCHERICHIA COLI  Final      Susceptibility   Escherichia coli - MIC*    AMPICILLIN >=32 RESISTANT Resistant     CEFAZOLIN >=64 RESISTANT Resistant     CEFTRIAXONE <=1 SENSITIVE Sensitive     CIPROFLOXACIN >=4 RESISTANT Resistant     GENTAMICIN <=1 SENSITIVE Sensitive     IMIPENEM <=0.25 SENSITIVE Sensitive     NITROFURANTOIN <=16 SENSITIVE Sensitive     TRIMETH/SULFA <=20 SENSITIVE Sensitive     AMPICILLIN/SULBACTAM >=32 RESISTANT Resistant     PIP/TAZO 64 INTERMEDIATE Intermediate     * >=100,000 COLONIES/mL ESCHERICHIA COLI    MRSA PCR Screening     Status: None   Collection Time: 10/25/15  4:35 AM  Result Value Ref Range Status   MRSA by PCR NEGATIVE NEGATIVE Final    Comment:        The GeneXpert MRSA Assay (FDA approved for NASAL specimens only), is one component of a comprehensive MRSA colonization surveillance program. It is not intended to diagnose MRSA infection nor to guide or monitor treatment for MRSA infections.          Studies: Dg Chest Port 1 View  10/25/2015  CLINICAL DATA:  Aspiration. EXAM: PORTABLE CHEST 1 VIEW COMPARISON:  10/22/2015 FINDINGS: Cardiac pacemaker. Shallow inspiration. Heart size and pulmonary vascularity are normal. Emphysematous changes in the lungs. Mild patchy infiltrates demonstrated in the right upper lung and both lung bases. This is compatible with aspiration in the appropriate clinical setting. No blunting of costophrenic angles. No pneumothorax. Old appearing left rib fractures. Tortuous aorta. IMPRESSION: Emphysematous changes with patchy infiltrates in both lungs. Changes are nonspecific but compatible with aspiration in the appropriate clinical setting. Electronically Signed   By: Burman Nieves M.D.   On: 10/25/2015 01:22   Dg Abd Portable 1v  10/25/2015  CLINICAL DATA:  70 year old male with vomiting. Reason cough and shortness of breath. Initial encounter. EXAM: PORTABLE ABDOMEN - 1 VIEW COMPARISON:  Chest radiographs from today and earlier. Most recent abdominal films 08/05/2013. FINDINGS: Portable AP supine view at 0825 hours. Gas-filled mildly to moderately dilated small bowel loops individually up to 38 mm diameter. There is colonic gas and retained stool in the descending colon and rectosigmoid colon. There is a stool ball in the rectum. No definite pneumoperitoneum on this supine view. Cardiac pacer/AICD leads. Reticulonodular opacity at the lung bases greater on the left. Osteopenia. Calcified atherosclerosis in the pelvis chronic right lower rib  fractures, especially the lateral ninth and tenth ribs. IMPRESSION: 1. Positive for acute small bowel obstruction, or less likely small bowel ileus. No definite pneumoperitoneum on this supine view. 2. Stool ball in the rectum, consider fecal impaction. Electronically Signed   By: Odessa Fleming M.D.   On: 10/25/2015 08:45        Scheduled Meds: . antiseptic oral rinse  7 mL Mouth Rinse BID  . atorvastatin  40 mg Oral Daily  . cefTRIAXone (ROCEPHIN)  IV  1 g Intravenous Q24H  . famotidine  20 mg Oral Daily  . FLUoxetine  20 mg Oral Daily  . metronidazole  500 mg Intravenous Q8H  . [START ON 10/28/2015] pantoprazole (PROTONIX) IV  40 mg Intravenous Q12H  . polyethylene glycol  17 g Oral BID  . saccharomyces boulardii  250 mg Oral BID  . sodium chloride  500 mL Intravenous Once  . sodium chloride flush  3 mL Intravenous Q12H  . valproic acid  250 mg Oral BID   Continuous Infusions: . sodium chloride 75 mL/hr at 10/26/15 1147  . 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    Huey Bienenstock, MD Pager [phone]  If 7PM-7AM, please contact night-coverage [email or web address] Password [email or web address] 10/26/2015, 4:06 PM    LOS: 4 days

## 2015-10-27 DIAGNOSIS — D62 Acute posthemorrhagic anemia: Secondary | ICD-10-CM

## 2015-10-27 DIAGNOSIS — I5023 Acute on chronic systolic (congestive) heart failure: Secondary | ICD-10-CM

## 2015-10-27 LAB — HEPARIN LEVEL (UNFRACTIONATED): HEPARIN UNFRACTIONATED: 0.44 [IU]/mL (ref 0.30–0.70)

## 2015-10-27 LAB — CBC
HCT: 22.4 % — ABNORMAL LOW (ref 39.0–52.0)
HEMOGLOBIN: 7.1 g/dL — AB (ref 13.0–17.0)
MCH: 30.2 pg (ref 26.0–34.0)
MCHC: 31.7 g/dL (ref 30.0–36.0)
MCV: 95.3 fL (ref 78.0–100.0)
Platelets: 196 10*3/uL (ref 150–400)
RBC: 2.35 MIL/uL — ABNORMAL LOW (ref 4.22–5.81)
RDW: 15.8 % — AB (ref 11.5–15.5)
WBC: 6.8 10*3/uL (ref 4.0–10.5)

## 2015-10-27 LAB — PREPARE RBC (CROSSMATCH)

## 2015-10-27 LAB — PROCALCITONIN: PROCALCITONIN: 0.74 ng/mL

## 2015-10-27 LAB — BASIC METABOLIC PANEL
Anion gap: 7 (ref 5–15)
BUN: 17 mg/dL (ref 6–20)
CO2: 25 mmol/L (ref 22–32)
CREATININE: 1.06 mg/dL (ref 0.61–1.24)
Calcium: 8.2 mg/dL — ABNORMAL LOW (ref 8.9–10.3)
Chloride: 108 mmol/L (ref 101–111)
GFR calc Af Amer: >60 mL/min (ref >60)
GLUCOSE: 73 mg/dL (ref 65–99)
POTASSIUM: 4.2 mmol/L (ref 3.5–5.1)
Sodium: 140 mmol/L (ref 135–145)

## 2015-10-27 LAB — ABO/RH: ABO/RH(D): O NEG

## 2015-10-27 MED ORDER — SODIUM CHLORIDE 0.9 % IV SOLN: Freq: Once | INTRAVENOUS | Status: AC

## 2015-10-27 NOTE — Progress Notes (Signed)
[facility] Progress Note  Subjective: Patient lying in bed. Appears in no distress. No obvious bleeding. Hemoglobin has dropped to 7.1.  Objective: Vital signs in last 24 hours: Temp:  [98.1 F (36.7 C)-98.4 F (36.9 C)] 98.2 F (36.8 C) (02/18 0611) Pulse Rate:  [65-68] 67 (02/18 0611) Resp:  [16-17] 17 (02/18 0611) BP: (92-116)/(57-71) 114/71 mmHg (02/18 0611) SpO2:  [95 %-96 %] 95 % (02/18 0611) Weight change:    PE: No acute distress  Heart regular rhythm  Lungs clear  Abdomen soft and nontender    Lab Results: Results for orders placed or performed during the hospital encounter of 10/22/15 (from the past 24 hour(s))  Heparin level (unfractionated)     Status: None   Collection Time: 10/26/15  5:04 PM  Result Value Ref Range   Heparin Unfractionated 0.34 0.30 - 0.70 IU/mL  Procalcitonin     Status: None   Collection Time: 10/27/15  6:18 AM  Result Value Ref Range   Procalcitonin 0.74 ng/mL  CBC     Status: Abnormal   Collection Time: 10/27/15  6:18 AM  Result Value Ref Range   WBC 6.8 4.0 - 10.5 K/uL   RBC 2.35 (L) 4.22 - 5.81 MIL/uL   Hemoglobin 7.1 (L) 13.0 - 17.0 g/dL   HCT 83.3 (L) 38.3 - 29.1 %   MCV 95.3 78.0 - 100.0 fL   MCH 30.2 26.0 - 34.0 pg   MCHC 31.7 30.0 - 36.0 g/dL   RDW 91.6 (H) 60.6 - 00.4 %   Platelets 196 150 - 400 K/uL  Heparin level (unfractionated)     Status: None   Collection Time: 10/27/15  6:18 AM  Result Value Ref Range   Heparin Unfractionated 0.44 0.30 - 0.70 IU/mL  Basic metabolic panel     Status: Abnormal   Collection Time: 10/27/15  6:18 AM  Result Value Ref Range   Sodium 140 135 - 145 mmol/L   Potassium 4.2 3.5 - 5.1 mmol/L   Chloride 108 101 - 111 mmol/L   CO2 25 22 - 32 mmol/L   Glucose, Bld 73 65 - 99 mg/dL   BUN 17 6 - 20 mg/dL   Creatinine, Ser 5.99 0.61 - 1.24 mg/dL   Calcium 8.2 (L) 8.9 - 10.3 mg/dL   GFR calc non Af Amer >60 >60 mL/min   GFR calc Af Amer >60 >60 mL/min   Anion gap 7 5 - 15   Prepare RBC     Status: None   Collection Time: 10/27/15  9:30 AM  Result Value Ref Range   Order Confirmation ORDER PROCESSED BY BLOOD BANK   Type and screen [facility]     Status: None (Preliminary result)   Collection Time: 10/27/15  9:30 AM  Result Value Ref Range   ABO/RH(D) O NEG    Antibody Screen PENDING    Sample Expiration 10/30/2015     Studies/Results: No results found.    Assessment: Anemia .recent coffee-ground emesis Cardiomyopathy with an ejection fraction of 20%per history  Plan: Transfuse blood as needed. Avoid nonessential EGD as this patient would be a very high-risk for endoscopy with sedation.      [name] 10/27/2015, 10:16 AM  Pager: [phone] If no answer or after 5 PM call [phone]

## 2015-10-27 NOTE — Progress Notes (Signed)
ANTICOAGULATION CONSULT NOTE - Follow up  Pharmacy Consult for Heparin Indication: apical thrombus  Allergies  Allergen Reactions  . Meperidine Hcl Other (See Comments)    unknown    Patient Measurements: Height: 5\' 9"  (175.3 cm) Weight: 107 lb 9.4 oz (48.8 kg) IBW/kg (Calculated) : 70.7  Vital Signs: Temp: 98.2 F (36.8 C) (02/18 0611) Temp Source: Oral (02/18 0611) BP: 114/71 mmHg (02/18 0611) Pulse Rate: 67 (02/18 0611)  Labs:  Recent Labs  10/25/15 0304 10/25/15 1428 10/26/15 0548 10/26/15 1704 10/27/15 0618  HGB 9.6* 9.4* 8.8*  --  7.1*  HCT 30.8* 31.6* 28.6*  --  22.4*  PLT 261  --  233  --  196  HEPARINUNFRC 0.38  --   --  0.34 0.44  CREATININE 1.42* 1.62* 1.50*  --   --     Estimated Creatinine Clearance: 32.5 mL/min (by C-G formula based on Cr of 1.5).   Assessment: 70 yo M admitted on 2/14 with chronic HF admitted with FTT and UTI.  He was found to have LV thrombus per ECHO. Pharmacy asked to initiate heparin infusion on 2/15.  Later that evening, patient feveloped hematemesis, so heparin gtt held.  Seen by GI - felt to be mucosal hemorrhage from stress gastritis or possibly reflux esophagitis; states reasonable to retry [facility].    2/17 AM: Pharmacy re-consulted to resume heparin without a bolus.    Today, 10/27/2015:  Second heparin level is in the target range on 600units/hr.  CBC: H/H and platelets trending down.  No bleeding reported/documented by AM nurse except urine is amber-colored. SCr remains elevated.   Goal of Therapy:  Heparin level 0.3-0.7 units/ml - will aim for low end given recent hematemesis Monitor platelets by anticoagulation protocol: Yes   Plan:   Cont heparin infusion at 600units/hr.   Check heparin level and CBC daily.  Charolotte Eke, PharmD, pager [phone]. 10/27/2015,7:47 AM.

## 2015-10-27 NOTE — Progress Notes (Signed)
PROGRESS NOTE    [name]  FBP:[id number]  DOB: [birth year]  DOA: 10/22/2015 PCP: Jeanine Luz, FNP Outpatient Specialists:   Hospital course: 70 year old male with history of CAD, status post PCI of LAD with BMS 04/03/06 at [facility], ischemic cardiomyopathy status post BiV ICD, chronic systolic CHF, HLD, HTN, CVA, multiple sclerosis, bipolar disorder and failure to thrive, presented to [facility] ED on 10/22/15 with history of progressive declining health over several months with weight loss and weakness and one-week history of abdominal pain, nausea, dyspnea on minimal exertion and weakness to a point where he was too weak to get out of bed. Per EDP, family no longer able to adequately care for him and requesting nursing home placement. In the ED, patient hypoxic in the mid 80s on room air, EKG showed paced rhythm and QTC 644 ms, chest x-ray demonstrated mild interstitial and pulmonary edema, urine microscopy suggestive of UTI. He was admitted for further evaluation and management of possible UTI and adult failure to thrive. Workup significant for UTI, urinary retention, elevated troponin, and echo suggestive of LV thrombus in the setting of severe ischemic cardiopathy, patient with an episode of coffee-ground emesis and aspiration pneumonia, required transfer to stepdown on 2/16.  Assessment & Plan:  UTI complicating urinary retention: -  Foley catheter placed in ED. Started empirically on IV Rocephin,transitioned to Zosyn 2/16 for aspiration pneumonia, urine culture growing Escherichia coli intermediate sensitivity to Zosyn, so switched back to Rocephin. - Started on Flomax - Voiding trial and a.m.  Acute hypoxic respiratory failure secondary to aspiration pneumonia - Patient with an episode of coffee-ground emesis 2/16, with evidence of aspiration. - Continue with oxygen as needed, continue with nebs, and pulmonary toilet -On IV Zosyn, transitioned to  Rocephin and Flagyl given urine sensitivities - SLP consultAppreciated, on dysphagia 3 diet  Coffee-ground emesis - Patient with one episode of coffee-ground emesis, hemoglobin remained stable, GI consult appreciated, resumed back on anticoagulation, unfortunately hemoglobin dropped significantly after anticoagulation, even though no evidence of overt GI bleed, so anticoagulation has been stopped, will try to avoid nonessential EGD in this patient who is very high risk, discussed with the son, he does not wish for any further workup at this point regarding GI bleed   Dehydration: -  Although chest x-ray was reported as mild interstitial and pulmonary edema, patient clinically appears dehydrated. Did receive a dose of Lasix in the ED. continue to hold diuresis, continue with gentle hydration especially in the setting of low blood pressure.  Chronic systolic CHF/ischemic cardiomyopathy/CAD/ICD: - 2-D echo 2014: LVEF 20% with diffuse hypokinesis. Patient asymptomatic with no chest pain or dyspnea. Minimally elevated troponin of unclear significance and may be from demand ischemia.  - Repeat 2-D echo suggestive of LV thrombus, cardiology consulted, limited study with use of Definity echo contrast suggestive of LV thrombus. - Continue to hold ischemic cardiomyopathy medication for hypertension  LV thrombus - Cardiology consult appreciated, anticoagulation has been discontinued given patient coffee-ground emesis, significant anemia on anticoagulation, not stable for workup including endoscopy, so no further anticoagulation, cost with the son agreeable with plan.  Essential hypertension:  - Blood pressure on the lower side, continue to hold ACEI, and beta blockers   Hyperlipidemia:   - Continue statins.  CVA:  - Hold Plavix secondary to coffee-ground emesis.  Bipolar disorder:  - Continue fluoxetine and valproate. Patient appears pleasantly confused-unclear baseline.  Anemia: - Anemia  secondary to blood loss, hemoglobin dropped to  7.1 this a.m., will transfuse 2 units PRBC, no further anticoagulation.  Prolonged QTC - : Keep potassium >4 and magnesium >2. Monitor on telemetry. Avoid QTC prolonging medications. Reviewing prior EKGs-always has had QTC prolongation-unsure if truly prolonged d/t BBB morphology  Adult failure to thrive:  - Probably multifactorial secondary to UTI, dehydration complicating multiple underlying severe comorbidities and age.  DVT prophylaxis: SCD Code Status: Full Family Communication: Discussed with son at bedside Disposition  Plan: SNF   Consultants:  Cardiology  Gastroenterology  Procedures:  Foley catheter 2/13 >  Antimicrobials:  IV Rocephin 2/13 > 2/16  IV Zosyn 2/16  Subjective: Pleasantly confused.Denies any complaints  Objective: Filed Vitals:   10/27/15 0611 10/27/15 1224 10/27/15 1253 10/27/15 1528  BP: 114/71 106/65 113/65 109/69  Pulse: 67 62 60 65  Temp: 98.2 F (36.8 C) 97.6 F (36.4 C) 97.4 F (36.3 C) 97.9 F (36.6 C)  TempSrc: Oral Oral Oral Oral  Resp: Height:      Weight:      SpO2: 95% 99% 99% 98%    Intake/Output Summary (Last 24 hours) at 10/27/15 1627 Last data filed at 10/27/15 1528  Gross per 24 hour  Intake 3058.38 ml  Output    600 ml  Net 2458.38 ml   Filed Weights   10/24/15 0505 10/25/15 0452 10/26/15 0451  Weight: 47.4 kg (104 lb 8 oz) 46.4 kg (102 lb 4.7 oz) 48.8 kg (107 lb 9.4 oz)    Exam:  General exam:chronically ill-appearing male, unkempt/disheveled, lying comfortably propped up in bed. mucosa dry.  Respiratory system: No respiratory distress, good air entry bilaterally, no wheezing. Cardiovascular system: S1 & S2 heard, RRR. No JVD, murmurs, gallops, clicks or pedal edema. Gastrointestinal system: Abdomen is nondistended, soft , mildly tender in epigastric area,. Normal bowel sounds heard.Foley catheter +.  Central nervous system: Alert and oriented only  to self. No focal neurological deficits. Extremities: Symmetric 5 x 5 power.Couple of superficial abrasions on her legs.  Data Reviewed: Basic Metabolic Panel:  Recent Labs Lab 10/23/15 1644 10/24/15 0343 10/25/15 0304 10/25/15 1428 10/26/15 0548 10/27/15 0618  NA  --  133* 135 135 136 140  K  --  4.8 5.7* 5.5* 5.1 4.2  CL  --  101 101 103 104 108  CO2  --  GLUCOSE  --  107* 149* 94 76 73  BUN  --  21* 25* 34* 36* 17  CREATININE  --  0.92 1.42* 1.62* 1.50* 1.06  CALCIUM  --  8.5* 8.7* 8.5* 8.4* 8.2*  MG 2.0  --   --   --   --   --    Liver Function Tests:  Recent Labs Lab 10/22/15 1622 10/25/15 0304  AST 31 35  ALT 10* 12*  ALKPHOS 53 57  BILITOT 0.4 0.4  PROT 8.2* 8.8*  ALBUMIN 2.8* 2.8*    Recent Labs Lab 10/22/15 1622  LIPASE 44   No results for input(s): AMMONIA in the last 168 hours. CBC:  Recent Labs Lab 10/22/15 1446 10/24/15 0343 10/25/15 0304 10/25/15 1428 10/26/15 0548 10/27/15 0618  WBC 7.8 6.8 12.4*  --  9.6 6.8  NEUTROABS 6.8  --   --   --   --   --   HGB 9.5* 8.5* 9.6* 9.4* 8.8* 7.1*  HCT 30.4* 27.7* 30.8* 31.6* 28.6* 22.4*  MCV 94.1 95.5 96.6  --  95.7 95.3  PLT 243 244  261  --  233 196   Cardiac Enzymes:  Recent Labs Lab 10/22/15 1622 10/23/15 1024 10/23/15 1644 10/23/15 2259  TROPONINI 0.05* 0.05* 0.04* 0.05*   BNP (last 3 results) No results for input(s): PROBNP in the last 8760 hours. CBG:  Recent Labs Lab 10/22/15 1534  GLUCAP 112*    Recent Results (from the past 240 hour(s))  Urine culture     Status: None   Collection Time: 10/22/15  9:25 PM  Result Value Ref Range Status   Specimen Description URINE, RANDOM  Bag ped  Final   Special Requests NONE  Final   Culture   Final    >=100,000 COLONIES/mL ESCHERICHIA COLI Performed at [facility]    Report Status 10/26/2015 FINAL  Final   Organism ID, Bacteria ESCHERICHIA COLI  Final      Susceptibility   Escherichia coli - MIC*     AMPICILLIN >=32 RESISTANT Resistant     CEFAZOLIN >=64 RESISTANT Resistant     CEFTRIAXONE <=1 SENSITIVE Sensitive     CIPROFLOXACIN >=4 RESISTANT Resistant     GENTAMICIN <=1 SENSITIVE Sensitive     IMIPENEM <=0.25 SENSITIVE Sensitive     NITROFURANTOIN <=16 SENSITIVE Sensitive     TRIMETH/SULFA <=20 SENSITIVE Sensitive     AMPICILLIN/SULBACTAM >=32 RESISTANT Resistant     PIP/TAZO 64 INTERMEDIATE Intermediate     * >=100,000 COLONIES/mL ESCHERICHIA COLI  MRSA PCR Screening     Status: None   Collection Time: 10/25/15  4:35 AM  Result Value Ref Range Status   MRSA by PCR NEGATIVE NEGATIVE Final    Comment:        The GeneXpert MRSA Assay (FDA approved for NASAL specimens only), is one component of a comprehensive MRSA colonization surveillance program. It is not intended to diagnose MRSA infection nor to guide or monitor treatment for MRSA infections.          Studies: No results found.      Scheduled Meds: . antiseptic oral rinse  7 mL Mouth Rinse BID  . atorvastatin  40 mg Oral Daily  . cefTRIAXone (ROCEPHIN)  IV  1 g Intravenous Q24H  . famotidine  20 mg Oral Daily  . FLUoxetine  20 mg Oral Daily  . metronidazole  500 mg Intravenous Q8H  . [START ON 10/28/2015] pantoprazole (PROTONIX) IV  40 mg Intravenous Q12H  . polyethylene glycol  17 g Oral BID  . saccharomyces boulardii  250 mg Oral BID  . sodium chloride  500 mL Intravenous Once  . sodium chloride flush  3 mL Intravenous Q12H  . tamsulosin  0.4 mg Oral QPC supper  . valproic acid  250 mg Oral BID   Continuous Infusions: . sodium chloride 75 mL/hr at 10/27/15 0541    Principal Problem:   Acute respiratory failure (HCC) Active Problems:   Essential hypertension   CAD S/P percutaneous coronary angioplasty   Cardiomyopathy, ischemic   Chronic combined systolic and diastolic CHF, NYHA class 3 (HCC)   Protein-calorie malnutrition, severe (HCC)   UTI (urinary tract infection)   Acute on chronic  systolic (congestive) heart failure (HCC)   Failure to thrive in adult   Complicated UTI (urinary tract infection)   Abnormal echocardiogram findings without diagnosis   LV (left ventricular) mural thrombus without MI (HCC)   Pressure ulcer   Adult failure to thrive   Aspiration into airway    Time spent: 30 minutes.    Huey Bienenstock, MD Pager [phone]  If 7PM-7AM, please contact night-coverage [email or web address] Password [email or web address] 10/27/2015, 4:27 PM    LOS: 5 days

## 2015-10-28 NOTE — Progress Notes (Signed)
PROGRESS NOTE    [name]  ZOX:[id number]  DOB: [birth year]  DOA: 10/22/2015 PCP: Jeanine Luz, FNP Outpatient Specialists:   Hospital course: 70 year old male with history of CAD, status post PCI of LAD with BMS 04/03/06 at [facility], ischemic cardiomyopathy status post BiV ICD, chronic systolic CHF, HLD, HTN, CVA, multiple sclerosis, bipolar disorder and failure to thrive, presented to [facility] ED on 10/22/15 with history of progressive declining health over several months with weight loss and weakness and one-week history of abdominal pain, nausea, dyspnea on minimal exertion and weakness to a point where he was too weak to get out of bed. Per EDP, family no longer able to adequately care for him and requesting nursing home placement. In the ED, patient hypoxic in the mid 80s on room air, EKG showed paced rhythm and QTC 644 ms, chest x-ray demonstrated mild interstitial and pulmonary edema, urine microscopy suggestive of UTI. He was admitted for further evaluation and management of possible UTI and adult failure to thrive. Workup significant for UTI, urinary retention, elevated troponin, and echo suggestive of LV thrombus in the setting of severe ischemic cardiopathy, patient with an episode of coffee-ground emesis and aspiration pneumonia, required transfer to stepdown on 2/16.  Assessment & Plan:  UTI complicating urinary retention: -  Foley catheter placed in ED. Started empirically on IV Rocephin,transitioned to Zosyn 2/16 for aspiration pneumonia, urine culture growing Escherichia coli intermediate sensitivity to Zosyn, so switched back to Rocephin. - Started on Flomax, foley catheter discontinued, no problems with voiding.  Acute hypoxic respiratory failure secondary to aspiration pneumonia - Patient with an episode of coffee-ground emesis 2/16, with evidence of aspiration. - Continue with oxygen as needed, continue with nebs, and pulmonary  toilet -On IV Zosyn, transitioned to Rocephin and Flagyl given urine sensitivities,would discontinue IV antibiotics in a.m. If continues to improve. - SLP consultAppreciated, on dysphagia 3 diet  Coffee-ground emesis - Patient with one episode of coffee-ground emesis, hemoglobin remained stable, GI consult appreciated, resumed back on anticoagulation, unfortunately hemoglobin dropped significantly after anticoagulation, even though no evidence of overt GI bleed, so anticoagulation has been stopped, will try to avoid nonessential EGD in this patient who is very high risk, discussed with the son, he does not wish for any further workup at this point regarding GI bleed   Dehydration: -  Although chest x-ray was reported as mild interstitial and pulmonary edema, patient clinically appears dehydrated. Did receive a dose of Lasix in the ED. continue to hold diuresis, continue with gentle hydration especially in the setting of low blood pressure.  Chronic systolic CHF/ischemic cardiomyopathy/CAD/ICD: - 2-D echo 2014: LVEF 20% with diffuse hypokinesis. Patient asymptomatic with no chest pain or dyspnea. Minimally elevated troponin of unclear significance and may be from demand ischemia.  - Repeat 2-D echo suggestive of LV thrombus, cardiology consulted, limited study with use of Definity echo contrast suggestive of LV thrombus. - Continue to hold ischemic cardiomyopathy medication for hypertension  LV thrombus - Cardiology consult appreciated, anticoagulation has been discontinued given patient coffee-ground emesis, significant anemia on anticoagulation, not stable for workup including endoscopy, so no further anticoagulation, cost with the son agreeable with plan.  Essential hypertension:  - Blood pressure on the lower side, continue to hold ACEI, and beta blockers   Hyperlipidemia:   - Continue statins.  CVA:  - Hold Plavix secondary to coffee-ground emesis.  Bipolar disorder:  - Continue  fluoxetine and valproate. Patient appears pleasantly confused-unclear baseline.  Anemia: - Anemia secondary to blood loss,transfused 2 units PRBC on 2/18 giving significant drop in hemoglobin to 7.1, glycohemoglobin improved today at 9.9, no evidence of active bleeding.  Prolonged QTC - : Keep potassium >4 and magnesium >2. Monitor on telemetry. Avoid QTC prolonging medications. Reviewing prior EKGs-always has had QTC prolongation-unsure if truly prolonged d/t BBB morphology  Adult failure to thrive:  - Probably multifactorial secondary to UTI, dehydration complicating multiple underlying severe comorbidities and age.  DVT prophylaxis: SCD Code Status: Full Family Communication: Discussed with son at bedside Disposition  Plan: SNFin 24 hours of hemoglobin in a.m. Remained stable   Consultants:  Cardiology  Gastroenterology  Procedures:  Foley catheter 2/13 >   Subjective: Complains of bilateral heel pain, otherwise denies chest pain or shortness of breath , nausea or vomiting Objective: Filed Vitals:   10/27/15 1932 10/27/15 2121 10/28/15 0633 10/28/15 1354  BP: 121/70 133/79 118/71 123/67  Pulse: 59 63 69 76  Temp: 97.4 F (36.3 C) 97.9 F (36.6 C) 98.7 F (37.1 C) 98 F (36.7 C)  TempSrc: Axillary Oral Oral Oral  Resp: 16 17 18 18   Height:      Weight:   52 kg (114 lb 10.2 oz)   SpO2: 98% 96% 96% 93%    Intake/Output Summary (Last 24 hours) at 10/28/15 1621 Last data filed at 10/28/15 1300  Gross per 24 hour  Intake   2629 ml  Output   1200 ml  Net   1429 ml   Filed Weights   10/25/15 0452 10/26/15 0451 10/28/15 0633  Weight: 46.4 kg (102 lb 4.7 oz) 48.8 kg (107 lb 9.4 oz) 52 kg (114 lb 10.2 oz)    Exam:  General exam:chronically ill-appearing male, unkempt/disheveled, lying comfortably propped up in bed. mucosa dry.  Respiratory system: No respiratory distress, good air entry bilaterally, no wheezing. Cardiovascular system: S1 & S2 heard, RRR. No JVD,  murmurs, gallops, clicks or pedal edema. Gastrointestinal system: Abdomen is nondistended, soft , mildly tender in epigastric area,. Normal bowel sounds heard.Foley catheter +.  Central nervous system: Alert and oriented only to self. No focal neurological deficits. Extremities: Symmetric 5 x 5 power.Couple of superficial abrasions on her legs.  Data Reviewed: Basic Metabolic Panel:  Recent Labs Lab 10/23/15 1644 10/24/15 0343 10/25/15 0304 10/25/15 1428 10/26/15 0548 10/27/15 0618  NA  --  133* 135 135 136 140  K  --  4.8 5.7* 5.5* 5.1 4.2  CL  --  101 101 103 104 108  CO2  --  23 24 24 24 25   GLUCOSE  --  107* 149* 94 76 73  BUN  --  21* 25* 34* 36* 17  CREATININE  --  0.92 1.42* 1.62* 1.50* 1.06  CALCIUM  --  8.5* 8.7* 8.5* 8.4* 8.2*  MG 2.0  --   --   --   --   --    Liver Function Tests:  Recent Labs Lab 10/22/15 1622 10/25/15 0304  AST 31 35  ALT 10* 12*  ALKPHOS 53 57  BILITOT 0.4 0.4  PROT 8.2* 8.8*  ALBUMIN 2.8* 2.8*    Recent Labs Lab 10/22/15 1622  LIPASE 44   No results for input(s): AMMONIA in the last 168 hours. CBC:  Recent Labs Lab 10/22/15 1446 10/24/15 0343 10/25/15 0304 10/25/15 1428 10/26/15 0548 10/27/15 0618 10/28/15 0636  WBC 7.8 6.8 12.4*  --  9.6 6.8 4.9  NEUTROABS 6.8  --   --   --   --   --   --  HGB 9.5* 8.5* 9.6* 9.4* 8.8* 7.1* 9.9*  HCT 30.4* 27.7* 30.8* 31.6* 28.6* 22.4* 31.4*  MCV 94.1 95.5 96.6  --  95.7 95.3 93.2  PLT 243 244 261  --  233 196 211   Cardiac Enzymes:  Recent Labs Lab 10/22/15 1622 10/23/15 1024 10/23/15 1644 10/23/15 2259  TROPONINI 0.05* 0.05* 0.04* 0.05*   BNP (last 3 results) No results for input(s): PROBNP in the last 8760 hours. CBG:  Recent Labs Lab 10/22/15 1534  GLUCAP 112*    Recent Results (from the past 240 hour(s))  Urine culture     Status: None   Collection Time: 10/22/15  9:25 PM  Result Value Ref Range Status   Specimen Description URINE, RANDOM  Bag ped  Final    Special Requests NONE  Final   Culture   Final    >=100,000 COLONIES/mL ESCHERICHIA COLI Performed at [facility]    Report Status 10/26/2015 FINAL  Final   Organism ID, Bacteria ESCHERICHIA COLI  Final      Susceptibility   Escherichia coli - MIC*    AMPICILLIN >=32 RESISTANT Resistant     CEFAZOLIN >=64 RESISTANT Resistant     CEFTRIAXONE <=1 SENSITIVE Sensitive     CIPROFLOXACIN >=4 RESISTANT Resistant     GENTAMICIN <=1 SENSITIVE Sensitive     IMIPENEM <=0.25 SENSITIVE Sensitive     NITROFURANTOIN <=16 SENSITIVE Sensitive     TRIMETH/SULFA <=20 SENSITIVE Sensitive     AMPICILLIN/SULBACTAM >=32 RESISTANT Resistant     PIP/TAZO 64 INTERMEDIATE Intermediate     * >=100,000 COLONIES/mL ESCHERICHIA COLI  MRSA PCR Screening     Status: None   Collection Time: 10/25/15  4:35 AM  Result Value Ref Range Status   MRSA by PCR NEGATIVE NEGATIVE Final    Comment:        The GeneXpert MRSA Assay (FDA approved for NASAL specimens only), is one component of a comprehensive MRSA colonization surveillance program. It is not intended to diagnose MRSA infection nor to guide or monitor treatment for MRSA infections.          Studies: No results found.      Scheduled Meds: . antiseptic oral rinse  7 mL Mouth Rinse BID  . atorvastatin  40 mg Oral Daily  . cefTRIAXone (ROCEPHIN)  IV  1 g Intravenous Q24H  . famotidine  20 mg Oral Daily  . FLUoxetine  20 mg Oral Daily  . metronidazole  500 mg Intravenous Q8H  . pantoprazole (PROTONIX) IV  40 mg Intravenous Q12H  . polyethylene glycol  17 g Oral BID  . saccharomyces boulardii  250 mg Oral BID  . sodium chloride  500 mL Intravenous Once  . sodium chloride flush  3 mL Intravenous Q12H  . tamsulosin  0.4 mg Oral QPC supper  . valproic acid  250 mg Oral BID   Continuous Infusions: . sodium chloride 75 mL/hr at 10/27/15 0541    Principal Problem:   Acute respiratory failure (HCC) Active Problems:   Essential  hypertension   CAD S/P percutaneous coronary angioplasty   Cardiomyopathy, ischemic   Chronic combined systolic and diastolic CHF, NYHA class 3 (HCC)   Protein-calorie malnutrition, severe (HCC)   UTI (urinary tract infection)   Acute on chronic systolic (congestive) heart failure (HCC)   Failure to thrive in adult   Complicated UTI (urinary tract infection)   Abnormal echocardiogram findings without diagnosis   LV (left ventricular) mural thrombus without MI (HCC)  Pressure ulcer   Adult failure to thrive   Aspiration into airway    Time spent: 25 minutes.    Huey Bienenstock, MD Pager [phone]  If 7PM-7AM, please contact night-coverage [email or web address] Password [email or web address] 10/28/2015, 4:21 PM    LOS: 6 days

## 2015-10-29 LAB — TYPE AND SCREEN
ABO/RH(D): O NEG
ANTIBODY SCREEN: NEGATIVE
UNIT DIVISION: 0
Unit division: 0

## 2015-10-29 LAB — CBC
HCT: 37 % — ABNORMAL LOW (ref 39.0–52.0)
HEMATOCRIT: 31.4 % — AB (ref 39.0–52.0)
HEMOGLOBIN: 9.9 g/dL — AB (ref 13.0–17.0)
Hemoglobin: 11.5 g/dL — ABNORMAL LOW (ref 13.0–17.0)
MCH: 29.4 pg (ref 26.0–34.0)
MCH: 29.1 pg (ref 26.0–34.0)
MCHC: 31.5 g/dL (ref 30.0–36.0)
MCHC: 31.1 g/dL (ref 30.0–36.0)
MCV: 93.2 fL (ref 78.0–100.0)
MCV: 93.7 fL (ref 78.0–100.0)
PLATELETS: 224 10*3/uL (ref 150–400)
Platelets: 211 10*3/uL (ref 150–400)
RBC: 3.37 MIL/uL — ABNORMAL LOW (ref 4.22–5.81)
RBC: 3.95 MIL/uL — ABNORMAL LOW (ref 4.22–5.81)
RDW: 16.8 % — AB (ref 11.5–15.5)
RDW: 16.2 % — AB (ref 11.5–15.5)
WBC: 4.9 10*3/uL (ref 4.0–10.5)
WBC: 6.2 10*3/uL (ref 4.0–10.5)

## 2015-10-29 MED ORDER — ENSURE PLUS PO LIQD: 237.0000 mL | Freq: Three times a day (TID) | ORAL | Status: DC

## 2015-10-29 MED ORDER — SACCHAROMYCES BOULARDII 250 MG PO CAPS: 250.0000 mg | ORAL_CAPSULE | Freq: Two times a day (BID) | ORAL | Status: DC

## 2015-10-29 MED ORDER — PANTOPRAZOLE SODIUM 40 MG PO TBEC: 40.0000 mg | DELAYED_RELEASE_TABLET | Freq: Every day | ORAL | Status: DC

## 2015-10-29 MED ORDER — POLYETHYLENE GLYCOL 3350 17 G PO PACK: 17.0000 g | PACK | Freq: Every day | ORAL | Status: DC | PRN

## 2015-10-29 MED ORDER — CETYLPYRIDINIUM CHLORIDE 0.05 % MT LIQD: 7.0000 mL | Freq: Two times a day (BID) | OROMUCOSAL | Status: AC

## 2015-10-29 MED ORDER — ACETAMINOPHEN 325 MG PO TABS: 650.0000 mg | ORAL_TABLET | ORAL | Status: AC | PRN

## 2015-10-29 MED ORDER — TAMSULOSIN HCL 0.4 MG PO CAPS: 0.4000 mg | ORAL_CAPSULE | Freq: Every day | ORAL | Status: DC

## 2015-10-29 NOTE — Clinical Social Work Placement (Signed)
   CLINICAL SOCIAL WORK PLACEMENT  NOTE  Date:  10/29/2015  Patient Details  Name: [name] MRN: [id number] Date of Birth: 1946-02-03  Clinical Social Work is seeking post-discharge placement for this patient at the Skilled  Nursing Facility level of care (*CSW will initial, date and re-position this form in  chart as items are completed):  Yes   Patient/family provided with [facility] Clinical Social Work Department's list of facilities offering this level of care within the geographic area requested by the patient (or if unable, by the patient's family).  Yes   Patient/family informed of their freedom to choose among providers that offer the needed level of care, that participate in Medicare, Medicaid or managed care program needed by the patient, have an available bed and are willing to accept the patient.  Yes   Patient/family informed of [facility]'s ownership interest in [facility] and [facility], as well as of the fact that they are under no obligation to receive care at these facilities.  PASRR submitted to EDS on       PASRR number received on       Existing PASRR number confirmed on 10/24/15     FL2 transmitted to all facilities in geographic area requested by pt/family on 10/24/15     FL2 transmitted to all facilities within larger geographic area on       Patient informed that his/her managed care company has contracts with or will negotiate with certain facilities, including the following:        Yes   Patient/family informed of bed offers received.  Patient chooses bed at [facility]     Physician recommends and patient chooses bed at      Patient to be transferred to [facility] on 10/29/15.  Patient to be transferred to facility by [facility]     Patient family notified on 10/29/15 of transfer.  Name of family member notified:  FRIEND     PHYSICIAN Please sign FL2     Additional Comment: Pt / friend are in  agreement with d/c to [facility] today. [facility] transport required. Medical necessity form completed. D/C Summary sent to SNF for review prior to d/c. Scripts included in  D/c packet. # for report provided to nsg.  _______________________________________________ Royetta Asal, LCSW  [phone] 10/29/2015, 3:16 PM

## 2015-10-29 NOTE — Progress Notes (Signed)
Gave report to Lillia Abed, Charity fundraiser at [facility]. Left number in case she had additional questions.

## 2015-10-29 NOTE — Discharge Instructions (Signed)
To be seen by SNF physician in 2-3 days  Get CBC, CMP, checked  by Primary MD next visit.    Activity: As tolerated with Full fall precautions use walker/cane & assistance as needed   Disposition SNF   Diet: Heart Healthy , dysphagia 3 with thin liquids, 1500 mL fluid restrictions, with feeding assistance and aspiration precautions.  For Heart failure patients - Check your Weight same time everyday, if you gain over 2 pounds, or you develop in leg swelling, experience more shortness of breath or chest pain, call your Primary MD immediately. Follow Cardiac Low Salt Diet and 1.5 lit/day fluid restriction.   On your next visit with your primary care physician please Get Medicines reviewed and adjusted.   Please request your Prim.MD to go over all Hospital Tests and Procedure/Radiological results at the follow up, please get all Hospital records sent to your Prim MD by signing hospital release before you go home.   If you experience worsening of your admission symptoms, develop shortness of breath, life threatening emergency, suicidal or homicidal thoughts you must seek medical attention immediately by calling 911 or calling your MD immediately  if symptoms less severe.  You Must read complete instructions/literature along with all the possible adverse reactions/side effects for all the Medicines you take and that have been prescribed to you. Take any new Medicines after you have completely understood and accpet all the possible adverse reactions/side effects.   Do not drive, operating heavy machinery, perform activities at heights, swimming or participation in water activities or provide baby sitting services if your were admitted for syncope or siezures until you have seen by Primary MD or a Neurologist and advised to do so again.  Do not drive when taking Pain medications.    Do not take more than prescribed Pain, Sleep and Anxiety Medications  Special Instructions: If you have smoked  or chewed Tobacco  in the last 2 yrs please stop smoking, stop any regular Alcohol  and or any Recreational drug use.  Wear Seat belts while driving.   Please note  You were cared for by a hospitalist during your hospital stay. If you have any questions about your discharge medications or the care you received while you were in the hospital after you are discharged, you can call the unit and asked to speak with the hospitalist on call if the hospitalist that took care of you is not available. Once you are discharged, your primary care physician will handle any further medical issues. Please note that NO REFILLS for any discharge medications will be authorized once you are discharged, as it is imperative that you return to your primary care physician (or establish a relationship with a primary care physician if you do not have one) for your aftercare needs so that they can reassess your need for medications and monitor your lab values.

## 2015-10-29 NOTE — Discharge Summary (Addendum)
[name], is a 70 y.o. male  DOB May 09, 1946  MRN [id number].  Admission date:  10/22/2015  Admitting Physician  Briscoe Deutscher, MD  Discharge Date:  10/29/2015   Primary MD  Jeanine Luz, FNP  Recommendations for primary care physician for things to follow:  - To be seen by is and a physician in 2 days - Patient to be seen by palliative medicine upon admission to SNF for goals of care discussion. - Please monitor CBC, if hemoglobin remains stable in one week, resume back on home dose Plavix ,hemoglobin baseline around 10. - Please monitor blood pressure and renal function, if remains stable resume on benazepril 20 mg oral daily.  CODE STATUS DO NOT RESUSCITATE/DO NOT INTUBATE    Admission Diagnosis  Adult failure to thrive [R62.7] UTI (lower urinary tract infection) [N39.0]   Discharge Diagnosis  Adult failure to thrive [R62.7] UTI (lower urinary tract infection) [N39.0]    Principal Problem:   Acute respiratory failure (HCC) Active Problems:   Essential hypertension   CAD S/P percutaneous coronary angioplasty   Cardiomyopathy, ischemic   Chronic combined systolic and diastolic CHF, NYHA class 3 (HCC)   Protein-calorie malnutrition, severe (HCC)   UTI (urinary tract infection)   Acute on chronic systolic (congestive) heart failure (HCC)   Failure to thrive in adult   Complicated UTI (urinary tract infection)   Abnormal echocardiogram findings without diagnosis   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 Coronary artery disease     s/p PCI of LAD with BMS 04/03/06 at [facility]  . Hyperlipidemia   . Hypertension   . Cerebrovascular accident [facility])     history of cerebrovascular accident with residual left hemiparesis in 2002, seen at [place]  . Multiple sclerosis (HCC)   . Ischemic cardiomyopathy     s/p BiV ICD implanted 2007 at [facility]  . Chronic combined systolic and diastolic congestive heart failure, NYHA class 3 (HCC)   . Bipolar disorder (HCC)   . Failure to thrive (0-17)     Past Surgical History  Procedure Laterality Date  . Tonsillectomy    . Vasectomy    . Implant of automatic cardioverter defibrillator      December 26, 2004 MDT BiV ICD implanted by Dr Chales Abrahams in [facility]  . Ptca      PCI LAD with BMS 04/03/06  . Ep implantable device N/A 01/18/2015    Procedure: BIV PPM Downgrade;  Surgeon: Hillis Range, MD;  Location: [facility] INVASIVE CV LAB;  Service: Cardiovascular;  Laterality: N/A;       History of present illness and  Hospital Course:     Kindly see H&P for history of present illness and admission details, please review complete Labs, Consult reports and Test reports for all details in brief  HPI  from the history and physical done on the day of admission on 10/23/2015  HPI: [name] is a 70 y.o. male with PMH of ischemic cardiomyopathy with stents and pacer placement in 2007, hypertension, and remote CVA who presents to the ED with 1 week of abdominal pain, nausea, generalized weakness, and shortness of breath. Patient has apparently been declining in health over several months with weight loss and progressive weakness. Approximately one week ago, patient developed abdominal pain with nausea, and also dyspnea with minimal exertion around the same time. Over the past week, symptoms have continued to progress until the day of admission with the patient was too weak to get out of bed. Per report of the ED physician, patient's family is no longer able to take adequate care of Mr. Chalk and they are therefore seeking placement in a nursing facility.  In ED, patient was found to be afebrile, desaturating to the mid 80s on room air, and with remaining vital signs stable. EKG featured a paced  rhythm with QTc prolonged to 644 ms. Chest x-ray demonstrates mild interstitial and pulmonary edema. CMP features a mild hyponatremia, elevated BUN, and albumin of 2.8. CBC is notable for a normocytic anemia and remaining blood work remarkable for BNP of 226 and troponin of 0.05. Urinalysis features many bacteria, moderate leukocytes, and positive nitrite. Urine culture was obtained in the emergency department, a 250 cc NS bolus was administered, and an empiric dose of Rocephin was given. Patient remained hemodynamically stable in the emergency department and will be admitted to the hospital for ongoing evaluation and management of UTI with acute on chronic systolic CHF on a background of adult failure to thrive.   Hospital Course  70 year old male with presents to ED with progressive declining over last few month, family unable to take care of patient, . He was admitted for further evaluation and management of  UTI and adult failure to thrive. Workup significant for UTI, urinary retention, elevated troponin, and echo suggestive of LV thrombus in the setting of severe ischemic cardiopathy, patient with an episode of coffee-ground emesis and aspiration pneumonia with anticoagulation, high risk for GI workup, changed again with anticoagulation, with significant hemoglobin drop, required 2 units PRBC transfusion, at this point discussed with patient and family, will hold on anticoagulation.  UTI complicating urinary retention: - Foley catheter placed in ED. Started empirically on IV Rocephin,transitioned to Zosyn 2/16 for aspiration pneumonia, urine culture growing Escherichia coli intermediate sensitivity to Zosyn, so switched back to Rocephin, need for oral antibiotics on discharge. - Started on Flomax, foley catheter discontinued, no problems with voiding.  Acute hypoxic respiratory failure secondary to aspiration pneumonia - Patient with an episode of coffee-ground emesis 2/16, with evidence of  aspiration. -On IV Zosyn, transitioned to Rocephin and Flagyl given urine sensitivities, treated, no need for further by mouth antibiotics on discharge - SLP consultAppreciated, on dysphagia 3 diet - Resolved, back on room air.  Coffee-ground emesis - Patient with one episode of coffee-ground emesis, hemoglobin remained stable, GI consult appreciated, resumed back on anticoagulation, unfortunately hemoglobin dropped significantly after anticoagulation so anticoagulation has been stopped, discussed with GI, will try to avoid nonessential EGD in this patient who is very high risk. - Patient transfused 2 units PRBC 2/18 - discussed with the son, and patient he does not wish for any further workup at this point regarding GI bleed   Dehydration: -Treated with IV fluids during hospital stay, resume back on home dose Lasix and Aldactone on  discharge  Chronic systolic CHF/ischemic cardiomyopathy/CAD/ICD: - 2-D echo 2014: LVEF 20% with diffuse hypokinesis. Patient asymptomatic with no chest pain or dyspnea. Minimally elevated troponin of unclear significance and may be from demand ischemia.  - Repeat 2-D echo suggestive of LV thrombus, cardiology consulted, limited study with use of Definity echo contrast suggestive of LV thrombus. - Benazepril, Lasix, Aldactone, Coreg been held during hospital stay given her renal failure, low blood pressure, blood pressure and renal function normalized on discharge, resume back on Lasix, Aldactone, Coreg, if remains stable in one week resume back on benazepril.  LV thrombus - Cardiology consult appreciated, patient with coffee-ground emesis on anticoagulation, as well significant drop in hemoglobin when resumed again on heparin GTT where he required 2 units PRBC transfusion , discussed risks and benefits with the family, they do not wish for aggressive workup , so no anticoagulation at this point, they understand poor life quality and overall poor  prognosis,.  Essential hypertension:  - Blood pressure on the lower side, normal at time of discharge, resume back on beta blockers, resume benazepril as an outpatient if remains stable  Hyperlipidemia:  - Continue statins.  CVA:  - Plavix on hold at time of discharge secondary to coffee-ground emesis and anemia, resume back on Plavix in one week if hemoglobin remains stable.  Bipolar disorder:  - Continue fluoxetine and valproate.   Anemia: - Anemia secondary to blood loss,transfused 2 units PRBC on 2/18 giving significant drop in hemoglobin to 7.1.  Prolonged QTC - : Keep potassium >4 and magnesium >2. Monitor on telemetry. Avoid QTC prolonging medications. Reviewing prior EKGs-always has had QTC prolongation-unsure if truly prolonged d/t BBB morphology  Adult failure to thrive:  - Probably multifactorial secondary to UTI, dehydration complicating multiple underlying severe comorbidities and age. - Continue with supplements  Goals of care: - At length discussion with patient and son, patient with overall very poor quality, they do request palliative medicine consult upon admission to a facility to address goals of care, as they do not wish for him to be admitted back to hospital if he becomes acutely ill again, and they wish for this to be discussed with palliative medicine on outpatient basis.    Discharge Condition: Stable   Follow UP  Follow-up Information    Follow up with Hillis Range, MD On 01/28/2016.   Specialty:  Cardiology   Why:  11:15 AM   Contact information:   [address] [place] [address] [phone]       Schedule an appointment as soon as possible for a visit with Jeanine Luz, FNP.   Specialty:  Family Medicine   Why:  After discharge from SNF   Contact information:   [address] [place] [address] [phone]         Discharge Instructions  and  Discharge Medications         Discharge Instructions     Discharge instructions    Complete by:  As directed   To be seen by SNF physician in 2-3 days  Get CBC, CMP, checked  by Primary MD next visit.    Activity: As tolerated with Full fall precautions use walker/cane & assistance as needed   Disposition SNF   Diet: Heart Healthy , dysphagia 3 with thin liquids, 1500 mL fluid restrictions, with feeding assistance and aspiration precautions.  For Heart failure patients - Check your Weight same time everyday, if you gain over 2 pounds, or you develop  in leg swelling, experience more shortness of breath or chest pain, call your Primary MD immediately. Follow Cardiac Low Salt Diet and 1.5 lit/day fluid restriction.   On your next visit with your primary care physician please Get Medicines reviewed and adjusted.   Please request your Prim.MD to go over all Hospital Tests and Procedure/Radiological results at the follow up, please get all Hospital records sent to your Prim MD by signing hospital release before you go home.   If you experience worsening of your admission symptoms, develop shortness of breath, life threatening emergency, suicidal or homicidal thoughts you must seek medical attention immediately by calling 911 or calling your MD immediately  if symptoms less severe.  You Must read complete instructions/literature along with all the possible adverse reactions/side effects for all the Medicines you take and that have been prescribed to you. Take any new Medicines after you have completely understood and accpet all the possible adverse reactions/side effects.   Do not drive, operating heavy machinery, perform activities at heights, swimming or participation in water activities or provide baby sitting services if your were admitted for syncope or siezures until you have seen by Primary MD or a Neurologist and advised to do so again.  Do not drive when taking Pain medications.    Do not take more than prescribed Pain, Sleep and Anxiety  Medications  Special Instructions: If you have smoked or chewed Tobacco  in the last 2 yrs please stop smoking, stop any regular Alcohol  and or any Recreational drug use.  Wear Seat belts while driving.   Please note  You were cared for by a hospitalist during your hospital stay. If you have any questions about your discharge medications or the care you received while you were in the hospital after you are discharged, you can call the unit and asked to speak with the hospitalist on call if the hospitalist that took care of you is not available. Once you are discharged, your primary care physician will handle any further medical issues. Please note that NO REFILLS for any discharge medications will be authorized once you are discharged, as it is imperative that you return to your primary care physician (or establish a relationship with a primary care physician if you do not have one) for your aftercare needs so that they can reassess your need for medications and monitor your lab values.     Increase activity slowly    Complete by:  As directed             Medication List    STOP taking these medications        benazepril 20 MG tablet  Commonly known as:  LOTENSIN     clopidogrel 75 MG tablet  Commonly known as:  PLAVIX      TAKE these medications        acetaminophen 325 MG tablet  Commonly known as:  TYLENOL  Take 2 tablets (650 mg total) by mouth every 4 (four) hours as needed for mild pain, moderate pain, fever or headache.     antiseptic oral rinse 0.05 % Liqd solution  Commonly known as:  CPC / CETYLPYRIDINIUM CHLORIDE 0.05%  7 mLs by Mouth Rinse route 2 (two) times daily.     atorvastatin 40 MG tablet  Commonly known as:  LIPITOR  TAKE ONE TABLET BY MOUTH ONCE DAILY     carvedilol 25 MG tablet  Commonly known as:  COREG  TAKE ONE TABLET BY MOUTH TWICE  DAILY WITH MEALS     ENSURE PLUS Liqd  Take 237 mLs by mouth 3 (three) times daily between meals. Or formulary  alternative at facility     erythromycin ophthalmic ointment  Place 1 application into the right eye 4 (four) times daily.     famotidine 20 MG tablet  Commonly known as:  PEPCID  TAKE ONE TABLET BY MOUTH ONCE DAILY     FLUoxetine 20 MG capsule  Commonly known as:  PROZAC  TAKE ONE CAPSULE BY MOUTH ONCE DAILY     furosemide 20 MG tablet  Commonly known as:  LASIX  TAKE ONE TABLET BY MOUTH ONCE DAILY     pantoprazole 40 MG tablet  Commonly known as:  PROTONIX  Take 1 tablet (40 mg total) by mouth daily.     polyethylene glycol packet  Commonly known as:  MIRALAX / GLYCOLAX  Take 17 g by mouth daily as needed for moderate constipation.     saccharomyces boulardii 250 MG capsule  Commonly known as:  FLORASTOR  Take 1 capsule (250 mg total) by mouth 2 (two) times daily.     spironolactone 25 MG tablet  Commonly known as:  ALDACTONE  TAKE ONE TABLET BY MOUTH ONCE DAILY     tamsulosin 0.4 MG Caps capsule  Commonly known as:  FLOMAX  Take 1 capsule (0.4 mg total) by mouth daily after supper.     valproic acid 250 MG capsule  Commonly known as:  DEPAKENE  TAKE ONE CAPSULE BY MOUTH TWICE DAILY          Diet and Activity recommendation: See Discharge Instructions above   Consults obtained - Cardiology GI   Major procedures and Radiology Reports - PLEASE review detailed and final reports for all details, in brief -      Dg Chest Port 1 View  10/25/2015  CLINICAL DATA:  Aspiration. EXAM: PORTABLE CHEST 1 VIEW COMPARISON:  10/22/2015 FINDINGS: Cardiac pacemaker. Shallow inspiration. Heart size and pulmonary vascularity are normal. Emphysematous changes in the lungs. Mild patchy infiltrates demonstrated in the right upper lung and both lung bases. This is compatible with aspiration in the appropriate clinical setting. No blunting of costophrenic angles. No pneumothorax. Old appearing left rib fractures. Tortuous aorta. IMPRESSION: Emphysematous changes with patchy  infiltrates in both lungs. Changes are nonspecific but compatible with aspiration in the appropriate clinical setting. Electronically Signed   By: Burman Nieves M.D.   On: 10/25/2015 01:22   Dg Chest Portable 1 View  10/22/2015  CLINICAL DATA:  Cough, short of breath EXAM: PORTABLE CHEST 1 VIEW COMPARISON:  Radiograph 05/21/1949 FINDINGS: LEFT-sided pacemaker overlies normal cardiac silhouette. There is chronic bronchitic markings centrally. There is fine airspace disease in the LEFT lower lobe and to a lesser degree the RIGHT lower lobe. Mild linear interstitial markings peripherally. IMPRESSION: Mild interstitial edema and pulmonary edema. Electronically Signed   By: Genevive Bi M.D.   On: 10/22/2015 15:53   Dg Abd Portable 1v  10/25/2015  CLINICAL DATA:  70 year old male with vomiting. Reason cough and shortness of breath. Initial encounter. EXAM: PORTABLE ABDOMEN - 1 VIEW COMPARISON:  Chest radiographs from today and earlier. Most recent abdominal films 08/05/2013. FINDINGS: Portable AP supine view at 0825 hours. Gas-filled mildly to moderately dilated small bowel loops individually up to 38 mm diameter. There is colonic gas and retained stool in the descending colon and rectosigmoid colon. There is a stool ball in the rectum. No definite pneumoperitoneum on this supine  view. Cardiac pacer/AICD leads. Reticulonodular opacity at the lung bases greater on the left. Osteopenia. Calcified atherosclerosis in the pelvis chronic right lower rib fractures, especially the lateral ninth and tenth ribs. IMPRESSION: 1. Positive for acute small bowel obstruction, or less likely small bowel ileus. No definite pneumoperitoneum on this supine view. 2. Stool ball in the rectum, consider fecal impaction. Electronically Signed   By: Odessa Fleming M.D.   On: 10/25/2015 08:45    Micro Results    Recent Results (from the past 240 hour(s))  Urine culture     Status: None   Collection Time: 10/22/15  9:25 PM  Result  Value Ref Range Status   Specimen Description URINE, RANDOM  Bag ped  Final   Special Requests NONE  Final   Culture   Final    >=100,000 COLONIES/mL ESCHERICHIA COLI Performed at [facility]    Report Status 10/26/2015 FINAL  Final   Organism ID, Bacteria ESCHERICHIA COLI  Final      Susceptibility   Escherichia coli - MIC*    AMPICILLIN >=32 RESISTANT Resistant     CEFAZOLIN >=64 RESISTANT Resistant     CEFTRIAXONE <=1 SENSITIVE Sensitive     CIPROFLOXACIN >=4 RESISTANT Resistant     GENTAMICIN <=1 SENSITIVE Sensitive     IMIPENEM <=0.25 SENSITIVE Sensitive     NITROFURANTOIN <=16 SENSITIVE Sensitive     TRIMETH/SULFA <=20 SENSITIVE Sensitive     AMPICILLIN/SULBACTAM >=32 RESISTANT Resistant     PIP/TAZO 64 INTERMEDIATE Intermediate     * >=100,000 COLONIES/mL ESCHERICHIA COLI  MRSA PCR Screening     Status: None   Collection Time: 10/25/15  4:35 AM  Result Value Ref Range Status   MRSA by PCR NEGATIVE NEGATIVE Final    Comment:        The GeneXpert MRSA Assay (FDA approved for NASAL specimens only), is one component of a comprehensive MRSA colonization surveillance program. It is not intended to diagnose MRSA infection nor to guide or monitor treatment for MRSA infections.        Today   Subjective:   Zamere Pasternak today has no headache,no chest or abdominal pain,, feels  Better today.   Objective:   Blood pressure 128/79, pulse 77, temperature 97.7 F (36.5 C), temperature source Oral, resp. rate 18, height 5\' 9"  (1.753 m), weight 53.3 kg (117 lb 8.1 oz), SpO2 92 %.   Intake/Output Summary (Last 24 hours) at 10/29/15 1116 Last data filed at 10/29/15 0934  Gross per 24 hour  Intake   1798 ml  Output   1200 ml  Net    598 ml    Exam General exam:chronically ill-appearing male, cachectic unkempt/disheveled, lying comfortably bed.  Respiratory system: No respiratory distress, good air entry bilaterally, no wheezing. Cardiovascular system: S1 & S2  heard, RRR. No JVD, murmurs, gallops, clicks or pedal edema. Gastrointestinal system: Abdomen is nondistended, soft , mildly tender in epigastric area,. Normal bowel sounds heard  Central nervous system: Alert and oriented , woman negative, mildly confused, No focal neurological deficits. Extremities: Symmetric 5 x 5 power.Couple of superficial abrasions on her legs. Data Review   CBC w Diff:  Lab Results  Component Value Date   WBC 6.2 10/29/2015   HGB 11.5* 10/29/2015   HCT 37.0* 10/29/2015   PLT 224 10/29/2015   LYMPHOPCT 9 10/22/2015   MONOPCT 5 10/22/2015   EOSPCT 0 10/22/2015   BASOPCT 0 10/22/2015    CMP:  Lab Results  Component  Value Date   NA 140 10/27/2015   K 4.2 10/27/2015   CL 108 10/27/2015   CO2 25 10/27/2015   BUN 17 10/27/2015   CREATININE 1.06 10/27/2015   CREATININE 1.10 05/26/2014   PROT 8.8* 10/25/2015   ALBUMIN 2.8* 10/25/2015   BILITOT 0.4 10/25/2015   ALKPHOS 57 10/25/2015   AST 35 10/25/2015   ALT 12* 10/25/2015  .   Total Time in preparing paper work, data evaluation and todays exam - 35 minutes  Deatra Mcmahen M.D on 10/29/2015 at 11:16 AM  [facility]   Office  [phone]

## 2015-10-30 ENCOUNTER — Telehealth: Payer: Self-pay | Admitting: *Deleted

## 2015-10-30 NOTE — Telephone Encounter (Signed)
Pt was on TCM list admitted for adult failure to thrive & UTI d/c 10/29/15 sent to SNF...Raechel Chute

## 2015-11-01 ENCOUNTER — Encounter: Payer: Self-pay | Admitting: Internal Medicine

## 2015-11-01 ENCOUNTER — Non-Acute Institutional Stay (SKILLED_NURSING_FACILITY): Payer: Medicare Other | Admitting: Internal Medicine

## 2015-11-01 DIAGNOSIS — E43 Unspecified severe protein-calorie malnutrition: Secondary | ICD-10-CM

## 2015-11-01 DIAGNOSIS — F1021 Alcohol dependence, in remission: Secondary | ICD-10-CM | POA: Diagnosis not present

## 2015-11-01 DIAGNOSIS — S51002A Unspecified open wound of left elbow, initial encounter: Secondary | ICD-10-CM | POA: Diagnosis not present

## 2015-11-01 DIAGNOSIS — E785 Hyperlipidemia, unspecified: Secondary | ICD-10-CM | POA: Diagnosis not present

## 2015-11-01 DIAGNOSIS — I10 Essential (primary) hypertension: Secondary | ICD-10-CM

## 2015-11-01 DIAGNOSIS — F3170 Bipolar disorder, currently in remission, most recent episode unspecified: Secondary | ICD-10-CM

## 2015-11-01 DIAGNOSIS — G35 Multiple sclerosis: Secondary | ICD-10-CM | POA: Diagnosis not present

## 2015-11-01 DIAGNOSIS — I255 Ischemic cardiomyopathy: Secondary | ICD-10-CM | POA: Diagnosis not present

## 2015-11-01 DIAGNOSIS — Z8673 Personal history of transient ischemic attack (TIA), and cerebral infarction without residual deficits: Secondary | ICD-10-CM

## 2015-11-01 DIAGNOSIS — I5042 Chronic combined systolic (congestive) and diastolic (congestive) heart failure: Secondary | ICD-10-CM | POA: Diagnosis not present

## 2015-11-01 DIAGNOSIS — D638 Anemia in other chronic diseases classified elsewhere: Secondary | ICD-10-CM | POA: Diagnosis not present

## 2015-11-01 DIAGNOSIS — R627 Adult failure to thrive: Secondary | ICD-10-CM

## 2015-11-01 DIAGNOSIS — S51012A Laceration without foreign body of left elbow, initial encounter: Secondary | ICD-10-CM

## 2015-11-01 NOTE — Progress Notes (Signed)
Patient ID: [name], male   DOB: 11/08/1945, 70 y.o.   MRN: [id number]    HISTORY AND PHYSICAL   DATE: 11/01/15  Location:  [facility]    Place of Service: SNF (31)   Extended Emergency Contact Information Primary Emergency Contact: Brown,Lavonne Address: [address]          [place], [place] [address] [place] Home Phone: [phone] Mobile Phone: [phone] Relation: Friend Secondary Emergency Contact: Foye [place] Home Phone: [phone] Mobile Phone: [phone] Relation: Son  Advanced Directive information  DNR  Chief Complaint  Patient presents with  . New Admit To SNF    HPI:  70 yo male seen today as a new admission into SNF following hospital stay for adult FTT, complicated UTI, acute respiratory failure,  A/C systolic HF, severe protein calorie malnutrition, CAD w hx PCI, pressure ulcer, ICM with hx AICD. ECG in ED revealed prolonged QTc , CXR mild interstitial and pulmonary edema. Albumin 2.8. UA abnormal sediment changes and cx (+) E coli. Foley was placed due to urinary retention but was removed prior to d/c. flomax started. UTI tx with IV zosyn ---> rocephin and fagyl as he was also dx with aspiration pneumonia. all abx completed prior tod/c.  speech tx consulted and recommended dysphagia 3 diet. He had an episode of coffee ground emesis. GI consulted. Hgb dropped to 7.1 and he was transfused 2 units PRBCs on 2/18th. No EGD performed due to pt being high risk. Hgb 11.5 at d/c. He was given IVF for dehydration. 2d echo s/o LV thrombus. Cardiology consulted. Family declined aggressive w/u. No anticoagulation due to he did not tolerate heparin gtt and had coffee ground emesis on anticoagulation. ACEI, lasix, aldactone and coreg held due to AKI (1.62) and low BPs. Prior to d/c, BP and renal fxn improved (1.06) and lasix, aldactone and coreg resumed.   He reports having bowel incontinence and needs to  be cleaned. He is a poor historian due to dementia. Hx obtained from chart. No falls. Appetite poor.   HTN/CAD/ICM/HF - has had PCI in past. HF is NYHA class 3. EF @ 20-25%. He has an AICD. Takes coreg, lasix, aldactone. ACEI (benazepril  daily) on hold until Cr stable. Followed by cardio  Hx CVA/MS - stable. plavix on hold due to recent GI bleed. MS treatment status unknown.  Hyperlipidemia - takes lipitor  Anemia - Hgb 11.5 at d/c. He rec'd 2 Units PRBCs due to acute blood loss  GERD - no N/V on pepcid and protonix. He has a past hx Etoh abuse  Bipolar d/o - stable on depakote, prozac  FTT - takes nutritional supplements and vitamins. He is taking florasater  He is using e-mycin eye ointment to OD QID Urinary retention - stable on flomax  Past Medical History  Diagnosis Date  . Coronary artery disease     s/p PCI of LAD with BMS 04/03/06 at [facility]  . Hyperlipidemia   . Hypertension   . Cerebrovascular accident [facility])     history of cerebrovascular accident with residual left hemiparesis in 2002, seen at [facility]  . Multiple sclerosis (HCC)   . Ischemic cardiomyopathy     s/p BiV ICD implanted 2007 at [facility]  . Chronic combined systolic and diastolic congestive heart failure, NYHA class 3 (HCC)   . Bipolar disorder (HCC)   . Failure to thrive (0-17)     Past Surgical  History  Procedure Laterality Date  . Tonsillectomy    . Vasectomy    . Implant of automatic cardioverter defibrillator      December 26, 2004 MDT BiV ICD implanted by Dr Chales Abrahams in [facility]  . Ptca      PCI LAD with BMS 04/03/06  . Ep implantable device N/A 01/18/2015    Procedure: BIV PPM Downgrade;  Surgeon: Hillis Range, MD;  Location: [facility] INVASIVE CV LAB;  Service: Cardiovascular;  Laterality: N/A;    Patient Care Team: Veryl Speak, FNP as PCP - General (Family Medicine)  Social History   Social History  . Marital Status: Divorced    Spouse Name: N/A  .  Number of Children: N/A  . Years of Education: N/A   Occupational History  . Not on file.   Social History Main Topics  . Smoking status: Former Smoker -- 0.50 packs/day for 40 years    Types: Cigarettes    Quit date: 10/13/2011  . Smokeless tobacco: Never Used     Comment: has tried to quit "many times" will continue to try to quit  . Alcohol Use: Yes     Comment: remote heavy ETOH, "years ago"  . Drug Use: No  . Sexual Activity: Not on file   Other Topics Concern  . Not on file   Social History Narrative   HSG. [employer] - 09 months. Married '70's - 52yrs/divorce. Married '80's - 10 yrs/divorced. 1 son - '71, 1 drtr '73. Has grandchildren. Lives alone. On disability.  Lives in [place].     reports that he quit smoking about 4 years ago. His smoking use included Cigarettes. He has a 20 pack-year smoking history. He has never used smokeless tobacco. He reports that he drinks alcohol. He reports that he does not use illicit drugs.  Family History  Problem Relation Age of Onset  . Heart disease Mother   . Kidney disease Father   . COPD Sister   . Cancer Neg Hx   . Diabetes Neg Hx    Family Status  Relation Status Death Age  . Mother Deceased 63  . Father Deceased 24  . Sister Alive   . Brother Alive   . Brother Biomedical engineer History  Administered Date(s) Administered  . Influenza,inj,Quad PF,36+ Mos 06/16/2014  . Pneumococcal Polysaccharide-23 06/16/2014    Allergies  Allergen Reactions  . Meperidine Hcl Other (See Comments)    unknown    Medications: Patient's Medications  New Prescriptions   No medications on file  Previous Medications   ACETAMINOPHEN (TYLENOL) 325 MG TABLET    Take 2 tablets (650 mg total) by mouth every 4 (four) hours as needed for mild pain, moderate pain, fever or headache.   ANTISEPTIC ORAL RINSE (CPC / CETYLPYRIDINIUM CHLORIDE 0.05%) 0.05 % LIQD SOLUTION    7 mLs by Mouth Rinse route 2 (two) times daily.   ATORVASTATIN  (LIPITOR) 40 MG TABLET    TAKE ONE TABLET BY MOUTH ONCE DAILY   CARVEDILOL (COREG) 25 MG TABLET    TAKE ONE TABLET BY MOUTH TWICE DAILY WITH MEALS   ENSURE PLUS (ENSURE PLUS) LIQD    Take 237 mLs by mouth 3 (three) times daily between meals. Or formulary alternative at facility   ERYTHROMYCIN OPHTHALMIC OINTMENT    Place 1 application into the right eye 4 (four) times daily.   FAMOTIDINE (PEPCID) 20 MG TABLET    TAKE ONE TABLET BY MOUTH ONCE DAILY  FLUOXETINE (PROZAC) 20 MG CAPSULE    TAKE ONE CAPSULE BY MOUTH ONCE DAILY   FUROSEMIDE (LASIX) 20 MG TABLET    TAKE ONE TABLET BY MOUTH ONCE DAILY   PANTOPRAZOLE (PROTONIX) 40 MG TABLET    Take 1 tablet (40 mg total) by mouth daily.   POLYETHYLENE GLYCOL (MIRALAX / GLYCOLAX) PACKET    Take 17 g by mouth daily as needed for moderate constipation.   SACCHAROMYCES BOULARDII (FLORASTOR) 250 MG CAPSULE    Take 1 capsule (250 mg total) by mouth 2 (two) times daily.   SPIRONOLACTONE (ALDACTONE) 25 MG TABLET    TAKE ONE TABLET BY MOUTH ONCE DAILY   TAMSULOSIN (FLOMAX) 0.4 MG CAPS CAPSULE    Take 1 capsule (0.4 mg total) by mouth daily after supper.   VALPROIC ACID (DEPAKENE) 250 MG CAPSULE    TAKE ONE CAPSULE BY MOUTH TWICE DAILY  Modified Medications   No medications on file  Discontinued Medications   No medications on file    Review of Systems  Unable to perform ROS: Dementia    Filed Vitals:   11/01/15 1742  BP: 128/84  Pulse: 90  Temp: 98.7 F (37.1 C)  SpO2: 90%   There is no weight on file to calculate BMI.  Physical Exam  Constitutional:  cachexic appearing, lying in bed, confused. HOB at 90 degrees but most of pt's body at foot of bed  HENT:  Mouth/Throat: Oropharynx is clear and moist.  Poor dentition  Eyes: Pupils are equal, round, and reactive to light. No scleral icterus.  Neck: Neck supple. Carotid bruit is not present. No thyromegaly present.  Cardiovascular: Normal rate and intact distal pulses.  An irregular rhythm  present. Exam reveals no gallop and no friction rub.   Murmur heard.  Systolic murmur is present with a grade of 1/6  No LE edema b/l. No calf TTP  Pulmonary/Chest: Effort normal and breath sounds normal. He has no wheezes. He has no rales. He exhibits no tenderness.  Abdominal: Soft. Bowel sounds are normal. He exhibits no distension, no abdominal bruit, no pulsatile midline mass and no mass. There is tenderness (RLQ). There is no rebound and no guarding.  Genitourinary:  Brown stool on sheets  Musculoskeletal: He exhibits edema and tenderness.  Small and large joint swelling  Lymphadenopathy:    He has no cervical adenopathy.  Neurological: He is alert.  Skin: Skin is warm and dry. No rash noted.  Left elbow dressing c/d/i  Psychiatric: He has a normal mood and affect. His speech is slurred. He is agitated.     Labs reviewed: Admission on 10/22/2015, Discharged on 10/29/2015  No results displayed because visit has over 200 results.  CBC Latest Ref Rng 10/29/2015 10/28/2015 10/27/2015  WBC 4.0 - 10.5 K/uL 6.2 4.9 6.8  Hemoglobin 13.0 - 17.0 g/dL 11.5(L) 9.9(L) 7.1(L)  Hematocrit 39.0 - 52.0 % 37.0(L) 31.4(L) 22.4(L)  Platelets 150 - 400 K/uL 224 211 196    CMP Latest Ref Rng 10/27/2015 10/26/2015 10/25/2015  Glucose 65 - 99 mg/dL 73 76 94  BUN 6 - 20 mg/dL 17 38(U) 82(C)  Creatinine 0.61 - 1.24 mg/dL 0.03 4.91(P) 9.15(A)  Sodium 135 - 145 mmol/L 140 136 135  Potassium 3.5 - 5.1 mmol/L 4.2 5.1 5.5(H)  Chloride 101 - 111 mmol/L 108 104 103  CO2 22 - 32 mmol/L 25 24 24   Calcium 8.9 - 10.3 mg/dL 8.2(L) 8.4(L) 8.5(L)  Total Protein 6.5 - 8.1 g/dL - - -  Total Bilirubin 0.3 - 1.2 mg/dL - - -  Alkaline Phos 38 - 126 U/L - - -  AST 15 - 41 U/L - - -  ALT 17 - 63 U/L - - -        Dg Chest Port 1 View  10/25/2015  CLINICAL DATA:  Aspiration. EXAM: PORTABLE CHEST 1 VIEW COMPARISON:  10/22/2015 FINDINGS: Cardiac pacemaker. Shallow inspiration. Heart size and pulmonary vascularity are  normal. Emphysematous changes in the lungs. Mild patchy infiltrates demonstrated in the right upper lung and both lung bases. This is compatible with aspiration in the appropriate clinical setting. No blunting of costophrenic angles. No pneumothorax. Old appearing left rib fractures. Tortuous aorta. IMPRESSION: Emphysematous changes with patchy infiltrates in both lungs. Changes are nonspecific but compatible with aspiration in the appropriate clinical setting. Electronically Signed   By: Burman Nieves M.D.   On: 10/25/2015 01:22   Dg Chest Portable 1 View  10/22/2015  CLINICAL DATA:  Cough, short of breath EXAM: PORTABLE CHEST 1 VIEW COMPARISON:  Radiograph 05/21/1949 FINDINGS: LEFT-sided pacemaker overlies normal cardiac silhouette. There is chronic bronchitic markings centrally. There is fine airspace disease in the LEFT lower lobe and to a lesser degree the RIGHT lower lobe. Mild linear interstitial markings peripherally. IMPRESSION: Mild interstitial edema and pulmonary edema. Electronically Signed   By: Genevive Bi M.D.   On: 10/22/2015 15:53   Dg Abd Portable 1v  10/25/2015  CLINICAL DATA:  70 year old male with vomiting. Reason cough and shortness of breath. Initial encounter. EXAM: PORTABLE ABDOMEN - 1 VIEW COMPARISON:  Chest radiographs from today and earlier. Most recent abdominal films 08/05/2013. FINDINGS: Portable AP supine view at 0825 hours. Gas-filled mildly to moderately dilated small bowel loops individually up to 38 mm diameter. There is colonic gas and retained stool in the descending colon and rectosigmoid colon. There is a stool ball in the rectum. No definite pneumoperitoneum on this supine view. Cardiac pacer/AICD leads. Reticulonodular opacity at the lung bases greater on the left. Osteopenia. Calcified atherosclerosis in the pelvis chronic right lower rib fractures, especially the lateral ninth and tenth ribs. IMPRESSION: 1. Positive for acute small bowel obstruction, or less  likely small bowel ileus. No definite pneumoperitoneum on this supine view. 2. Stool ball in the rectum, consider fecal impaction. Electronically Signed   By: Odessa Fleming M.D.   On: 10/25/2015 08:45     Assessment/Plan   ICD-9-CM ICD-10-CM   1. Adult failure to thrive 783.7 R62.7   2. Protein-calorie malnutrition, severe (HCC) 262 E43   3. ALCOHOL ABUSE, HX OF V11.3 F10.21   4. Cardiomyopathy, ischemic 414.8 I25.5   5. Chronic combined systolic and diastolic CHF, NYHA class 3 (HCC) 428.42 I50.42    428.0    6. Bipolar disorder in partial remission, most recent episode unspecified type (HCC) 296.80 F31.70   7. Essential hypertension 401.9 I10   8. History of stroke with deficits V12.54 Z86.73   9. Multiple sclerosis (HCC) - tx status unknown 340 G35   10. Hyperlipidemia 272.4 E78.5   11. Anemia of chronic disease - probably related to #1-3 285.29 D63.8   12. Skin tear of left elbow without complication, initial encounter 881.01 S51.002A     Check CBC with diff, CMP. If Cr remains stable, will resume benazepril. If hgb remains stable, will resume plavix  Palliative care consult  PT/OT/ST as ordered  Aspiration precautions  Fall precautions  Wound care as ordered for left elbow  Cont current meds as  ordered  Avoid QTc prolonging meds  GOAL: short term rehab with potential for long term care. Per hospital records, family unable to care for him at home. Pt has an overall poor prognosis. Palliative care consult. Communicated with pt and nursing.  Will follow  Hayden Kihara S. Ancil Linsey  [facility] [address] [place], [place] [address] [phone] Cell (Monday-Friday 8 AM - 5 PM) [phone] After 5 PM and follow prompts

## 2015-11-02 LAB — HEPATIC FUNCTION PANEL
ALK PHOS: 61 U/L (ref 25–125)
ALT: 17 U/L (ref 10–40)
ALT: 17 U/L (ref 10–40)
AST: 37 U/L (ref 14–40)
AST: 37 U/L (ref 14–40)
Alkaline Phosphatase: 61 U/L (ref 25–125)
Bilirubin, Total: 0.1 mg/dL
Bilirubin, Total: 0.2 mg/dL

## 2015-11-02 LAB — BASIC METABOLIC PANEL
BUN: 12 mg/dL (ref 4–21)
BUN: 12 mg/dL (ref 4–21)
Creatinine: 0.7 mg/dL (ref 0.6–1.3)
Creatinine: 0.7 mg/dL (ref <=1.3)
GLUCOSE: 108 mg/dL
GLUCOSE: 108 mg/dL
POTASSIUM: 4.6 mmol/L (ref 3.4–5.3)
POTASSIUM: 4.6 mmol/L (ref 3.4–5.3)
SODIUM: 135 mmol/L — AB (ref 137–147)
Sodium: 135 mmol/L — AB (ref 137–147)

## 2015-11-02 LAB — CBC AND DIFFERENTIAL
HCT: 38 % — AB (ref 41–53)
HEMATOCRIT: 38 % — AB (ref 41–53)
HEMOGLOBIN: 12.4 g/dL — AB (ref 13.5–17.5)
HEMOGLOBIN: 12.4 g/dL — AB (ref 13.5–17.5)
PLATELETS: 266 10*3/uL (ref 150–399)
Platelets: 266 10*3/uL (ref 150–399)
WBC: 10.7 10*3/mL
WBC: 10.7 10^3/mL

## 2015-11-08 ENCOUNTER — Non-Acute Institutional Stay (SKILLED_NURSING_FACILITY): Payer: Medicare Other | Admitting: Internal Medicine

## 2015-11-08 ENCOUNTER — Encounter: Payer: Self-pay | Admitting: Internal Medicine

## 2015-11-08 DIAGNOSIS — G35 Multiple sclerosis: Secondary | ICD-10-CM | POA: Diagnosis not present

## 2015-11-08 DIAGNOSIS — I959 Hypotension, unspecified: Secondary | ICD-10-CM | POA: Diagnosis not present

## 2015-11-08 DIAGNOSIS — I5042 Chronic combined systolic (congestive) and diastolic (congestive) heart failure: Secondary | ICD-10-CM | POA: Diagnosis not present

## 2015-11-08 DIAGNOSIS — Z8673 Personal history of transient ischemic attack (TIA), and cerebral infarction without residual deficits: Secondary | ICD-10-CM

## 2015-11-08 DIAGNOSIS — R627 Adult failure to thrive: Secondary | ICD-10-CM | POA: Diagnosis not present

## 2015-11-08 DIAGNOSIS — D638 Anemia in other chronic diseases classified elsewhere: Secondary | ICD-10-CM | POA: Diagnosis not present

## 2015-11-08 DIAGNOSIS — F1021 Alcohol dependence, in remission: Secondary | ICD-10-CM

## 2015-11-08 DIAGNOSIS — F3170 Bipolar disorder, currently in remission, most recent episode unspecified: Secondary | ICD-10-CM | POA: Diagnosis not present

## 2015-11-08 DIAGNOSIS — R1013 Epigastric pain: Secondary | ICD-10-CM

## 2015-11-08 DIAGNOSIS — K920 Hematemesis: Secondary | ICD-10-CM

## 2015-11-08 DIAGNOSIS — E43 Unspecified severe protein-calorie malnutrition: Secondary | ICD-10-CM

## 2015-11-08 DIAGNOSIS — R110 Nausea: Secondary | ICD-10-CM | POA: Diagnosis not present

## 2015-11-08 NOTE — Progress Notes (Signed)
Patient ID: [name], male   DOB: 04-Nov-1945, 70 y.o.   MRN: [id number]    DATE: 11/08/15  Location:  [facility]    Place of Service: SNF (31)   Extended Emergency Contact Information Primary Emergency Contact: Brown,Lavonne Address: [address]          [place], [place] [address] [place] Home Phone: [phone] Mobile Phone: [phone] Relation: Friend Secondary Emergency Contact: Foye [place] Home Phone: [phone] Mobile Phone: [phone] Relation: Son  Advanced Directive information Does patient have an advance directive?: Yes, Type of Advance Directive: Out of facility DNR (pink MOST or yellow form), Does patient want to make changes to advanced directive?: No - Patient declined  Chief Complaint  Patient presents with  . Acute Visit    HPI:  70 yo male seen today for hematemesis. Nursing reports x 3 episodes of coffee ground emesis since yesterday. Pt reports midepigastric pain prior to emesis along with nausea. He has not eaten since yesterday but has been drinking water and juice. No f/c. No change in BM. No change in urinary habits. He was started on zofran yesterday.  Past hx Etoh abuse. While in hospital,  he had an episode of coffee ground emesis. GI was consulted. Hgb dropped to 7.1 and he was transfused 2 units PRBCs on 2/18th. No EGD performed due to pt being high risk. Hgb 11.5 at d/c. He was given IVF for dehydration.  HTN/CAD/ICM/HF - has had PCI in past. HF is NYHA class 3. EF @ 20-25%. He has an AICD. Takes coreg, lasix, aldactone. ACEI (benazepril 20mg  daily) on hold until Cr stable. Followed by cardio  Hx CVA/MS - stable. plavix on hold due to recent GI bleed. MS treatment status unknown.  Hyperlipidemia - takes lipitor  GERD - no N/V on pepcid and protonix. He has a past hx Etoh abuse  Bipolar d/o - stable on depakote, prozac  FTT - takes nutritional supplements and vitamins. He is  taking florasater    Past Medical History  Diagnosis Date  . Coronary artery disease     s/p PCI of LAD with BMS 04/03/06 at [facility]  . Hyperlipidemia   . Hypertension   . Cerebrovascular accident [facility])     history of cerebrovascular accident with residual left hemiparesis in 2002, seen at [facility]  . Multiple sclerosis (HCC)   . Ischemic cardiomyopathy     s/p BiV ICD implanted 2007 at [facility]  . Chronic combined systolic and diastolic congestive heart failure, NYHA class 3 (HCC)   . Bipolar disorder (HCC)   . Failure to thrive (0-17)     Past Surgical History  Procedure Laterality Date  . Tonsillectomy    . Vasectomy    . Implant of automatic cardioverter defibrillator      December 26, 2004 MDT BiV ICD implanted by Dr Chales Abrahams in [facility]  . Ptca      PCI LAD with BMS 04/03/06  . Ep implantable device N/A 01/18/2015    Procedure: BIV PPM Downgrade;  Surgeon: Hillis Range, MD;  [facility];  Service: Cardiovascular;  Laterality: N/A;    Patient Care Team: Veryl Speak, FNP as PCP - General (Family Medicine)  Social History   Social History  . Marital Status: Divorced    Spouse Name: N/A  . Number of Children: N/A  . Years of Education: N/A   Occupational History  .  Not on file.   Social History Main Topics  . Smoking status: Former Smoker -- 0.50 packs/day for 40 years    Types: Cigarettes    Quit date: 10/13/2011  . Smokeless tobacco: Never Used     Comment: has tried to quit "many times" will continue to try to quit  . Alcohol Use: Yes     Comment: remote heavy ETOH, "years ago"  . Drug Use: No  . Sexual Activity: Not on file   Other Topics Concern  . Not on file   Social History Narrative   HSG. [employer] - 09 months. Married '70's - 87yrs/divorce. Married '80's - 10 yrs/divorced. 1 son - '71, 1 drtr '73. Has grandchildren. Lives alone. On disability.  Lives in [place].     reports that he quit smoking  about 4 years ago. His smoking use included Cigarettes. He has a 20 pack-year smoking history. He has never used smokeless tobacco. He reports that he drinks alcohol. He reports that he does not use illicit drugs.  Immunization History  Administered Date(s) Administered  . Influenza,inj,Quad PF,36+ Mos 06/16/2014  . Pneumococcal Polysaccharide-23 06/16/2014    Allergies  Allergen Reactions  . Meperidine Hcl Other (See Comments)    unknown    Medications: Patient's Medications  New Prescriptions   No medications on file  Previous Medications   ACETAMINOPHEN (TYLENOL) 325 MG TABLET    Take 2 tablets (650 mg total) by mouth every 4 (four) hours as needed for mild pain, moderate pain, fever or headache.   ANTISEPTIC ORAL RINSE (CPC / CETYLPYRIDINIUM CHLORIDE 0.05%) 0.05 % LIQD SOLUTION    7 mLs by Mouth Rinse route 2 (two) times daily.   ATORVASTATIN (LIPITOR) 40 MG TABLET    TAKE ONE TABLET BY MOUTH ONCE DAILY   CARVEDILOL (COREG) 25 MG TABLET    TAKE ONE TABLET BY MOUTH TWICE DAILY WITH MEALS   ENSURE PLUS (ENSURE PLUS) LIQD    Take 237 mLs by mouth 3 (three) times daily between meals. Or formulary alternative at facility   ERYTHROMYCIN OPHTHALMIC OINTMENT    Place 1 application into the right eye 4 (four) times daily.   FAMOTIDINE (PEPCID) 20 MG TABLET    TAKE ONE TABLET BY MOUTH ONCE DAILY   FLUOXETINE (PROZAC) 20 MG CAPSULE    TAKE ONE CAPSULE BY MOUTH ONCE DAILY   FUROSEMIDE (LASIX) 20 MG TABLET    TAKE ONE TABLET BY MOUTH ONCE DAILY   PANTOPRAZOLE (PROTONIX) 40 MG TABLET    Take 1 tablet (40 mg total) by mouth daily.   POLYETHYLENE GLYCOL (MIRALAX / GLYCOLAX) PACKET    Take 17 g by mouth daily as needed for moderate constipation.   SACCHAROMYCES BOULARDII (FLORASTOR) 250 MG CAPSULE    Take 1 capsule (250 mg total) by mouth 2 (two) times daily.   SPIRONOLACTONE (ALDACTONE) 25 MG TABLET    TAKE ONE TABLET BY MOUTH ONCE DAILY   TAMSULOSIN (FLOMAX) 0.4 MG CAPS CAPSULE    Take 1 capsule  (0.4 mg total) by mouth daily after supper.   VALPROIC ACID (DEPAKENE) 250 MG CAPSULE    TAKE ONE CAPSULE BY MOUTH TWICE DAILY  Modified Medications   No medications on file  Discontinued Medications   No medications on file    Review of Systems  Unable to perform ROS: Psychiatric disorder    Filed Vitals:   11/08/15 1532  BP: 82/55  Pulse: 78  Temp: 97.9 F (36.6 C)  TempSrc: Oral  Resp: 18  SpO2: 82%   There is no weight on file to calculate BMI.  Physical Exam  Constitutional:  Cachexic, Frail appearing in NAD, looks pale. Significant other present  HENT:  Mouth/Throat: Oropharynx is clear and moist.  Eyes: Pupils are equal, round, and reactive to light. No scleral icterus.  Neck: Neck supple. Carotid bruit is not present. No thyromegaly present.  Cardiovascular: Normal rate, regular rhythm and intact distal pulses.  Exam reveals no gallop and no friction rub.   Murmur (1/6 SEM) heard. no distal LE swelling. No calf TTP  Pulmonary/Chest: Effort normal and breath sounds normal. He has no wheezes. He has no rales. He exhibits no tenderness.  Abdominal: Soft. Bowel sounds are normal. He exhibits no distension, no abdominal bruit, no pulsatile midline mass and no mass. There is no tenderness. There is no rebound and no guarding.  No palpable mass  Lymphadenopathy:    He has no cervical adenopathy.  Neurological: He is alert.  Skin: Skin is warm and dry. No rash noted.  Psychiatric: He has a normal mood and affect. His behavior is normal.     Labs reviewed: Nursing Home on 11/08/2015  Component Date Value Ref Range Status  . Hemoglobin 11/02/2015 12.4* 13.5 - 17.5 g/dL Final  . HCT 16/06/9603 38* 41 - 53 % Final  . Platelets 11/02/2015 266  150 - 399 K/L Final  . WBC 11/02/2015 10.7   Final  . Glucose 11/02/2015 108   Final  . BUN 11/02/2015 12  4 - 21 mg/dL Final  . Creatinine 54/05/8118 0.7  .6 - 1.3 mg/dL Final  . Potassium 14/78/2956 4.6  3.4 - 5.3 mmol/L  Final  . Sodium 11/02/2015 135* 137 - 147 mmol/L Final  . Alkaline Phosphatase 11/02/2015 61  25 - 125 U/L Final  . ALT 11/02/2015 17  10 - 40 U/L Final  . AST 11/02/2015 37  14 - 40 U/L Final  . Bilirubin, Total 11/02/2015 0.2   Final  Admission on 10/22/2015, Discharged on 10/29/2015  No results displayed because visit has over 200 results.      Dg Chest Port 1 View  10/25/2015  CLINICAL DATA:  Aspiration. EXAM: PORTABLE CHEST 1 VIEW COMPARISON:  10/22/2015 FINDINGS: Cardiac pacemaker. Shallow inspiration. Heart size and pulmonary vascularity are normal. Emphysematous changes in the lungs. Mild patchy infiltrates demonstrated in the right upper lung and both lung bases. This is compatible with aspiration in the appropriate clinical setting. No blunting of costophrenic angles. No pneumothorax. Old appearing left rib fractures. Tortuous aorta. IMPRESSION: Emphysematous changes with patchy infiltrates in both lungs. Changes are nonspecific but compatible with aspiration in the appropriate clinical setting. Electronically Signed   By: Burman Nieves M.D.   On: 10/25/2015 01:22   Dg Chest Portable 1 View  10/22/2015  CLINICAL DATA:  Cough, short of breath EXAM: PORTABLE CHEST 1 VIEW COMPARISON:  Radiograph 05/21/1949 FINDINGS: LEFT-sided pacemaker overlies normal cardiac silhouette. There is chronic bronchitic markings centrally. There is fine airspace disease in the LEFT lower lobe and to a lesser degree the RIGHT lower lobe. Mild linear interstitial markings peripherally. IMPRESSION: Mild interstitial edema and pulmonary edema. Electronically Signed   By: Genevive Bi M.D.   On: 10/22/2015 15:53   Dg Abd Portable 1v  10/25/2015  CLINICAL DATA:  69 year old male with vomiting. Reason cough and shortness of breath. Initial encounter. EXAM: PORTABLE ABDOMEN - 1 VIEW COMPARISON:  Chest radiographs from today and earlier. Most recent abdominal films  08/05/2013. FINDINGS: Portable AP supine view at  0825 hours. Gas-filled mildly to moderately dilated small bowel loops individually up to 38 mm diameter. There is colonic gas and retained stool in the descending colon and rectosigmoid colon. There is a stool ball in the rectum. No definite pneumoperitoneum on this supine view. Cardiac pacer/AICD leads. Reticulonodular opacity at the lung bases greater on the left. Osteopenia. Calcified atherosclerosis in the pelvis chronic right lower rib fractures, especially the lateral ninth and tenth ribs. IMPRESSION: 1. Positive for acute small bowel obstruction, or less likely small bowel ileus. No definite pneumoperitoneum on this supine view. 2. Stool ball in the rectum, consider fecal impaction. Electronically Signed   By: Odessa Fleming M.D.   On: 10/25/2015 08:45     Assessment/Plan    ICD-9-CM ICD-10-CM   1. Hematemesis with nausea 578.0 K92.0    787.02 R11.0   2. Midepigastric pain 789.06 R10.13   3. ALCOHOL ABUSE, HX OF V11.3 F10.21   4. Hypotension, unspecified hypotension type 458.9 I95.9   5. Adult failure to thrive 783.7 R62.7   6. Protein-calorie malnutrition, severe (HCC) 262 E43   7. Chronic combined systolic and diastolic CHF, NYHA class 3 (HCC) 428.42 I50.42    428.0    8. Bipolar disorder in partial remission, most recent episode unspecified type (HCC) 296.80 F31.70   9. History of stroke V12.54 Z86.73   10. Multiple sclerosis (HCC) 340 G35   11. Anemia of chronic disease 285.29 D63.8     Check CBC stat. T/c PRBCs if hgb worse and/or symptomatic. T/c more labs including BMP  May need to tx to ED if condition worsens or does not improve  Start NSS IV ay 60cc/hr x 1 L  Palliative care consult for GI Bleed, FTT. Nursing s/w son. He does not aggressive w/u regarding GI bleed but is amenable to PRBCs and IVF  Cont current meds as ordered  Will follow  Catherina Pates S. Ancil Linsey  [facility] [address] [place], [place]  [address] [phone] Cell (Monday-Friday 8 AM - 5 PM) [phone] After 5 PM and follow prompts

## 2015-11-09 ENCOUNTER — Non-Acute Institutional Stay (SKILLED_NURSING_FACILITY): Payer: Medicare Other | Admitting: Nurse Practitioner

## 2015-11-09 ENCOUNTER — Encounter: Payer: Self-pay | Admitting: Nurse Practitioner

## 2015-11-09 DIAGNOSIS — K920 Hematemesis: Secondary | ICD-10-CM | POA: Diagnosis not present

## 2015-11-09 DIAGNOSIS — D72829 Elevated white blood cell count, unspecified: Secondary | ICD-10-CM | POA: Diagnosis not present

## 2015-11-09 DIAGNOSIS — R110 Nausea: Secondary | ICD-10-CM

## 2015-11-09 LAB — CBC AND DIFFERENTIAL
HCT: 34 % — AB (ref 41–53)
HEMOGLOBIN: 11.1 g/dL — AB (ref 13.5–17.5)
Platelets: 314 10*3/uL (ref 150–399)
WBC: 16.1 10*3/mL

## 2015-11-09 NOTE — Progress Notes (Signed)
Patient ID: [name], male   DOB: 02-14-1946, 70 y.o.   MRN: [id number]    Nursing Home Location:  [facility]   Place of Service: SNF (31)  PCP: Jeanine Luz, FNP  Allergies  Allergen Reactions  . Meperidine Hcl Other (See Comments)    unknown    Chief Complaint  Patient presents with  . Acute Visit    HPI:  Patient is a 70 y.o. male seen today at [facility] at the request of nursing. Pt with hx of CVA, MS, hyperlipidemia, bipolar, GERD, HTN, CAD, HF and more. pt was seen yesterday by Dr Montez Morita due to hematemesis. Which he has not had any more of. Past hx Etoh abuse. While in hospital he had an episode of coffee ground emesis. GI was consulted. Hgb dropped to 7.1 and he was transfused 2 units PRBCs on 2/18th. No EGD performed due to pt being high risk. Hgb 11.5 at d/c. He was given IVF for dehydration. hgb has remained stable. Pt has been on IVF in facility since yesterday. WBC count elevated. Pt without fever and has no complaints today. No cough or congestion, no dysuria, no changes in bowel habits. No abdominal pain or discomfort.  Review of Systems:  Review of Systems  Unable to perform ROS: Other  pt mumbles, HOH  Past Medical History  Diagnosis Date  . Coronary artery disease     s/p PCI of LAD with BMS 04/03/06 at [facility]  . Hyperlipidemia   . Hypertension   . Cerebrovascular accident [facility])     history of cerebrovascular accident with residual left hemiparesis in 2002, seen at [facility]  . Multiple sclerosis (HCC)   . Ischemic cardiomyopathy     s/p BiV ICD implanted 2007 at [facility]  . Chronic combined systolic and diastolic congestive heart failure, NYHA class 3 (HCC)   . Bipolar disorder (HCC)   . Failure to thrive (0-17)    Past Surgical History  Procedure Laterality Date  . Tonsillectomy    . Vasectomy    . Implant of automatic cardioverter defibrillator      December 26, 2004 MDT BiV ICD  implanted by Dr Chales Abrahams in [facility]  . Ptca      PCI LAD with BMS 04/03/06  . Ep implantable device N/A 01/18/2015    Procedure: BIV PPM Downgrade;  Surgeon: Hillis Range, MD;  [facility]: [facility];  Service: Cardiovascular;  Laterality: N/A;   Social History:   reports that he quit smoking about 4 years ago. His smoking use included Cigarettes. He has a 20 pack-year smoking history. He has never used smokeless tobacco. He reports that he drinks alcohol. He reports that he does not use illicit drugs.  Family History  Problem Relation Age of Onset  . Heart disease Mother   . Kidney disease Father   . COPD Sister   . Cancer Neg Hx   . Diabetes Neg Hx     Medications: Patient's Medications  New Prescriptions   No medications on file  Previous Medications   ACETAMINOPHEN (TYLENOL) 325 MG TABLET    Take 2 tablets (650 mg total) by mouth every 4 (four) hours as needed for mild pain, moderate pain, fever or headache.   ANTISEPTIC ORAL RINSE (CPC / CETYLPYRIDINIUM CHLORIDE 0.05%) 0.05 % LIQD SOLUTION    7 mLs by Mouth Rinse route 2 (two) times daily.   ATORVASTATIN (LIPITOR) 40 MG TABLET  TAKE ONE TABLET BY MOUTH ONCE DAILY   CARVEDILOL (COREG) 25 MG TABLET    TAKE ONE TABLET BY MOUTH TWICE DAILY WITH MEALS   ENSURE PLUS (ENSURE PLUS) LIQD    Take 237 mLs by mouth 3 (three) times daily between meals. Or formulary alternative at facility   ERYTHROMYCIN OPHTHALMIC OINTMENT    Place 1 application into the right eye 4 (four) times daily.   FAMOTIDINE (PEPCID) 20 MG TABLET    TAKE ONE TABLET BY MOUTH ONCE DAILY   FLUOXETINE (PROZAC) 20 MG CAPSULE    TAKE ONE CAPSULE BY MOUTH ONCE DAILY   FUROSEMIDE (LASIX) 20 MG TABLET    TAKE ONE TABLET BY MOUTH ONCE DAILY   ONDANSETRON (ZOFRAN) 4 MG TABLET    Take 4 mg by mouth every 6 (six) hours as needed for vomiting.   PANTOPRAZOLE (PROTONIX) 40 MG TABLET    Take 1 tablet (40 mg total) by mouth daily.   POLYETHYLENE GLYCOL (MIRALAX / GLYCOLAX)  PACKET    Take 17 g by mouth daily as needed for moderate constipation.   SACCHAROMYCES BOULARDII (FLORASTOR) 250 MG CAPSULE    Take 1 capsule (250 mg total) by mouth 2 (two) times daily.   SPIRONOLACTONE (ALDACTONE) 25 MG TABLET    TAKE ONE TABLET BY MOUTH ONCE DAILY   TAMSULOSIN (FLOMAX) 0.4 MG CAPS CAPSULE    Take 1 capsule (0.4 mg total) by mouth daily after supper.   VALPROIC ACID (DEPAKENE) 250 MG CAPSULE    TAKE ONE CAPSULE BY MOUTH TWICE DAILY  Modified Medications   No medications on file  Discontinued Medications   No medications on file     Physical Exam: Filed Vitals:   11/09/15 1449  BP: 95/63  Pulse: 100  Temp: 96.8 F (36 C)  TempSrc: Oral  Resp: 18  Height: 5\' 9"  (1.753 m)  Weight: 108 lb 9.6 oz (49.261 kg)    Physical Exam  Constitutional:  Frail appearing in NAD  HENT:  Mouth/Throat: Oropharynx is clear and moist.  Eyes: Pupils are equal, round, and reactive to light. No scleral icterus.  Neck: Neck supple. Carotid bruit is not present. No thyromegaly present.  Cardiovascular: Normal rate, regular rhythm and intact distal pulses.  Exam reveals no gallop and no friction rub.   Murmur (1/6 SEM) heard. no distal LE swelling. No calf TTP  Pulmonary/Chest: Effort normal and breath sounds normal.  Abdominal: Soft. Bowel sounds are normal. He exhibits no distension, no abdominal bruit, no pulsatile midline mass and no mass. There is no tenderness. There is no rebound and no guarding.  Musculoskeletal: He exhibits no edema or tenderness.  Lymphadenopathy:    He has no cervical adenopathy.  Neurological: He is alert.  Skin: Skin is warm and dry. No rash noted.  Psychiatric: He has a normal mood and affect. His behavior is normal.    Labs reviewed: Basic Metabolic Panel:  Recent Labs  81/85/90 1644  10/25/15 1428 10/26/15 0548 10/27/15 0618 11/02/15  NA  --   < > 135 136 140 135*  135*  K  --   < > 5.5* 5.1 4.2 4.6  4.6  CL  --   < > 103 104 108  --     CO2  --   < > 24 24 25   --   GLUCOSE  --   < > 94 76 73  --   BUN  --   < > 34* 36* 17 12  12  CREATININE  --   < > 1.62* 1.50* 1.06 0.7  0.7  CALCIUM  --   < > 8.5* 8.4* 8.2*  --   MG 2.0  --   --   --   --   --   < > = values in this interval not displayed. Liver Function Tests:  Recent Labs  10/22/15 1622 10/25/15 0304 11/02/15  AST 31 35 37  37  ALT 10* 12* 17  17  ALKPHOS 53 57 61  61  BILITOT 0.4 0.4  --   PROT 8.2* 8.8*  --   ALBUMIN 2.8* 2.8*  --     Recent Labs  10/22/15 1622  LIPASE 44   No results for input(s): AMMONIA in the last 8760 hours. CBC:  Recent Labs  10/22/15 1446  10/27/15 0618 10/28/15 0636 10/29/15 0557 11/02/15 11/09/15  WBC 7.8  < > 6.8 4.9 6.2 10.7  10.7 16.1  NEUTROABS 6.8  --   --   --   --   --   --   HGB 9.5*  < > 7.1* 9.9* 11.5* 12.4*  12.4* 11.1*  HCT 30.4*  < > 22.4* 31.4* 37.0* 38*  38* 34*  MCV 94.1  < > 95.3 93.2 93.7  --   --   PLT 243  < > 196 211 224 266  266 314  < > = values in this interval not displayed. TSH: No results for input(s): TSH in the last 8760 hours. A1C: Lab Results  Component Value Date   HGBA1C 5.6 08/07/2013   Lipid Panel: No results for input(s): CHOL, HDL, LDLCALC, TRIG, CHOLHDL, LDLDIRECT in the last 8760 hours.  Radiological Exams: Dg Chest Portable 1 View  10/22/2015  CLINICAL DATA:  Cough, short of breath EXAM: PORTABLE CHEST 1 VIEW COMPARISON:  Radiograph 05/21/1949 FINDINGS: LEFT-sided pacemaker overlies normal cardiac silhouette. There is chronic bronchitic markings centrally. There is fine airspace disease in the LEFT lower lobe and to a lesser degree the RIGHT lower lobe. Mild linear interstitial markings peripherally. IMPRESSION: Mild interstitial edema and pulmonary edema. Electronically Signed   By: Genevive Bi M.D.   On: 10/22/2015 15:53    Assessment/Plan 1. Hematemesis with nausea No further episodes, hgb stable at this time. Pt completed IVFs  2.  Leukocytosis Elevation in WBCs. Will get UA C&S, KUB, Chest xray to rule out infectious cause. Also will get CBC with diff and BMP at this time to follow up wbc, hgb, renal function and electrolytes  -VS q shift per staff and to notify with any changes.   Janene Harvey. Biagio Borg  [facility] [phone] 8 am - 5 pm) [phone] (after hours)

## 2015-11-10 LAB — CBC AND DIFFERENTIAL
HEMATOCRIT: 28 % — AB (ref 41–53)
Hemoglobin: 9.1 g/dL — AB (ref 13.5–17.5)
Platelets: 233 10*3/uL (ref 150–399)
WBC: 8.4 10^3/mL

## 2015-11-10 LAB — BASIC METABOLIC PANEL
BUN: 33 mg/dL — AB (ref 4–21)
CREATININE: 0.8 mg/dL (ref 0.6–1.3)
GLUCOSE: 108 mg/dL
POTASSIUM: 4.6 mmol/L (ref 3.4–5.3)
SODIUM: 135 mmol/L — AB (ref 137–147)

## 2015-12-12 ENCOUNTER — Non-Acute Institutional Stay (SKILLED_NURSING_FACILITY): Payer: Medicare Other | Admitting: Adult Health

## 2015-12-12 ENCOUNTER — Encounter: Payer: Self-pay | Admitting: Adult Health

## 2015-12-12 DIAGNOSIS — I10 Essential (primary) hypertension: Secondary | ICD-10-CM

## 2015-12-12 DIAGNOSIS — F3170 Bipolar disorder, currently in remission, most recent episode unspecified: Secondary | ICD-10-CM

## 2015-12-12 DIAGNOSIS — D638 Anemia in other chronic diseases classified elsewhere: Secondary | ICD-10-CM

## 2015-12-12 DIAGNOSIS — I513 Intracardiac thrombosis, not elsewhere classified: Secondary | ICD-10-CM

## 2015-12-12 DIAGNOSIS — R627 Adult failure to thrive: Secondary | ICD-10-CM

## 2015-12-12 DIAGNOSIS — L8990 Pressure ulcer of unspecified site, unspecified stage: Secondary | ICD-10-CM

## 2015-12-12 DIAGNOSIS — I240 Acute coronary thrombosis not resulting in myocardial infarction: Secondary | ICD-10-CM

## 2015-12-12 DIAGNOSIS — I5042 Chronic combined systolic (congestive) and diastolic (congestive) heart failure: Secondary | ICD-10-CM | POA: Diagnosis not present

## 2015-12-12 NOTE — Progress Notes (Signed)
Patient ID: [name], male   DOB: April 03, 1946, 70 y.o.   MRN: [id number]  Location:  [facility] Room Number: 109 A Place of Service:  SNF (31) Provider:   Peggye Ley, ANP [facility] [phone]   Kirt Boys, DO  Patient Care Team: Kirt Boys, DO as PCP - General (Internal Medicine)  Extended Emergency Contact Information Primary Emergency Contact: [facility] Address: [address]          [place], [place] [address] [place] Home Phone: [phone] Mobile Phone: [phone] Relation: Friend Secondary Emergency Contact: Foye [place] Home Phone: [phone] Mobile Phone: [phone] Relation: Son  Code Status: DNR Goals of care: Advanced Directive information Advanced Directives 12/12/2015  Does patient have an advance directive? Yes  Type of Advance Directive Out of facility DNR (pink MOST or yellow form)  Does patient want to make changes to advanced directive? No - Patient declined  Copy of advanced directive(s) in chart? Yes     Chief Complaint  Patient presents with  . Medical Management of Chronic Issues    Routine Visit    HPI:  Pt is a 70 y.o. male seen today for medical management of chronic diseases.  He has a hx of MS, CHF (EF 20%), CVA, bipolar, FTT, htn, CAD, and AICD. He has lost 10 lbs in the past two months. His overall intake is poor and he is on a fluid restriction of 1700.  This was increased from 1500cc two weeks ago and he seems to have tolerated this well. He was seen last month for hematemesis and elevated WBC. His urine, CXR, and KUB were normal. He has not had any further episodes of vomiting. His WBC returned to 8.4 on 11/09/15, from 16.  He denies any pain today.  He was in the hospital in feb due to bloody vomit, UTI,  and was transfused.  No further workup was completed due to his debility. He has a hx of ETOH use. During his hospital stay he was  found to have an LV thrombus but he can no longer take anticoagulation so he was evaluated by palliative care.  Palliative care recommended hospice. The notes indicate that they are waiting to hear back from his family about this issue.   Functionally he is non ambulatory and requires assistance for all ADL's.      Past Medical History  Diagnosis Date  . Coronary artery disease     s/p PCI of LAD with BMS 04/03/06 at [facility]  . Hyperlipidemia   . Hypertension   . Cerebrovascular accident [facility])     history of cerebrovascular accident with residual left hemiparesis in 2002, seen at [facility]  . Multiple sclerosis (HCC)   . Ischemic cardiomyopathy     s/p BiV ICD implanted 2007 at [facility]  . Chronic combined systolic and diastolic congestive heart failure, NYHA class 3 (HCC)   . Bipolar disorder (HCC)   . Failure to thrive (0-17)    Past Surgical History  Procedure Laterality Date  . Tonsillectomy    . Vasectomy    . Implant of automatic cardioverter defibrillator      December 26, 2004 MDT BiV ICD implanted by Dr Chales Abrahams in [facility]  . Ptca      PCI LAD with BMS 04/03/06  . Ep implantable device N/A 01/18/2015    Procedure: BIV PPM Downgrade;  Surgeon: Hillis Range, MD;  Location: [facility] INVASIVE CV LAB;  Service: Cardiovascular;  Laterality: N/A;    Allergies  Allergen Reactions  . Meperidine Hcl Other (See Comments)    unknown      Medication List       This list is accurate as of: 12/12/15 11:56 AM.  Always use your most recent med list.               acetaminophen 325 MG tablet  Commonly known as:  TYLENOL  Take 2 tablets (650 mg total) by mouth every 4 (four) hours as needed for mild pain, moderate pain, fever or headache.     antiseptic oral rinse 0.05 % Liqd solution  Commonly known as:  CPC / CETYLPYRIDINIUM CHLORIDE 0.05%  7 mLs by Mouth Rinse route 2 (two) times daily.     atorvastatin 40 MG tablet  Commonly known as:  LIPITOR    TAKE ONE TABLET BY MOUTH ONCE DAILY     carvedilol 25 MG tablet  Commonly known as:  COREG  TAKE ONE TABLET BY MOUTH TWICE DAILY WITH MEALS     ENSURE CLEAR Liqd  Take 1 Can by mouth daily.     erythromycin ophthalmic ointment  Place 1 application into the right eye 4 (four) times daily.     famotidine 20 MG tablet  Commonly known as:  PEPCID  TAKE ONE TABLET BY MOUTH ONCE DAILY     FLUoxetine 20 MG capsule  Commonly known as:  PROZAC  TAKE ONE CAPSULE BY MOUTH ONCE DAILY     furosemide 20 MG tablet  Commonly known as:  LASIX  TAKE ONE TABLET BY MOUTH ONCE DAILY     ondansetron 4 MG tablet  Commonly known as:  ZOFRAN  Take 4 mg by mouth every 6 (six) hours as needed for vomiting.     pantoprazole 40 MG tablet  Commonly known as:  PROTONIX  Take 1 tablet (40 mg total) by mouth daily.     polyethylene glycol packet  Commonly known as:  MIRALAX / GLYCOLAX  Take 17 g by mouth daily as needed for moderate constipation.     saccharomyces boulardii 250 MG capsule  Commonly known as:  FLORASTOR  Take 1 capsule (250 mg total) by mouth 2 (two) times daily.     spironolactone 25 MG tablet  Commonly known as:  ALDACTONE  TAKE ONE TABLET BY MOUTH ONCE DAILY     tamsulosin 0.4 MG Caps capsule  Commonly known as:  FLOMAX  Take 1 capsule (0.4 mg total) by mouth daily after supper.     valproic acid 250 MG capsule  Commonly known as:  DEPAKENE  TAKE ONE CAPSULE BY MOUTH TWICE DAILY        Review of Systems  Constitutional: Positive for activity change, appetite change and unexpected weight change. Negative for fever, chills, diaphoresis and fatigue.  HENT: Negative for congestion.   Respiratory: Positive for cough. Negative for shortness of breath and wheezing.   Cardiovascular: Negative for chest pain, palpitations and leg swelling.  Genitourinary: Negative for dysuria.  Neurological: Positive for speech difficulty. Negative for dizziness, seizures, facial asymmetry,  light-headedness, numbness and headaches.  Psychiatric/Behavioral: Positive for confusion. Negative for behavioral problems and agitation.    Immunization History  Administered Date(s) Administered  . Influenza,inj,Quad PF,36+ Mos 06/16/2014  . PPD Test 10/29/2015  . Pneumococcal Polysaccharide-23 06/16/2014   Pertinent  Health Maintenance Due  Topic Date Due  . COLONOSCOPY  07/09/1997  . INFLUENZA VACCINE  12/01/2016 (Originally 04/08/2016)  . PNA vac Low Risk Adult (2 of 2 - PCV13) 12/01/2016 (Originally 06/17/2015)   No flowsheet data found. Functional Status Survey:   Wt Readings from Last 3 Encounters:  12/12/15 107 lb 9.6 oz (48.807 kg)  11/09/15 108 lb 9.6 oz (49.261 kg)  10/29/15 117 lb 8.1 oz (53.3 kg)   Filed Vitals:   12/12/15 1145  BP: 145/76  Pulse: 79  Temp: 97.6 F (36.4 C)  TempSrc: Oral  Resp: 18  Height: 5\' 9"  (1.753 m)  Weight: 107 lb 9.6 oz (48.807 kg)   Body mass index is 15.88 kg/(m^2). Physical Exam  Constitutional: No distress.  Fail thin  HENT:  Head: Normocephalic and atraumatic.  Poor dentition  Eyes: Pupils are equal, round, and reactive to light.  Neck: Normal range of motion. Neck supple. No JVD present.  Cardiovascular: Normal rate and regular rhythm.   Pulmonary/Chest: Effort normal and breath sounds normal. No respiratory distress.  Abdominal: Soft. Bowel sounds are normal. He exhibits no distension.  Neurological: He is alert.  Oriented to self, situation, place, not time. Able to f/c  Skin: Skin is warm and dry. He is not diaphoretic.  Heels and buttocks pink but blanch  Psychiatric:  flat    Labs reviewed:  Recent Labs  10/23/15 1644  10/25/15 1428 10/26/15 0548 10/27/15 0618 11/02/15 11/10/15  NA  --   < > 135 136 140 135*  135* 135*  K  --   < > 5.5* 5.1 4.2 4.6  4.6 4.6  CL  --   < > 103 104 108  --   --   CO2  --   < > 24 24 25   --   --   GLUCOSE  --   < > 94 76 73  --   --   BUN  --   < > 34* 36* 17 12  12   33*  CREATININE  --   < > 1.62* 1.50* 1.06 0.7  0.7 0.8  CALCIUM  --   < > 8.5* 8.4* 8.2*  --   --   MG 2.0  --   --   --   --   --   --   < > = values in this interval not displayed.  Recent Labs  10/22/15 1622 10/25/15 0304 11/02/15  AST 31 35 37  37  ALT 10* 12* 17  17  ALKPHOS 53 57 61  61  BILITOT 0.4 0.4  --   PROT 8.2* 8.8*  --   ALBUMIN 2.8* 2.8*  --     Recent Labs  10/22/15 1446  10/27/15 0618 10/28/15 0636 10/29/15 0557 11/02/15 11/09/15 11/10/15  WBC 7.8  < > 6.8 4.9 6.2 10.7  10.7 16.1 8.4  NEUTROABS 6.8  --   --   --   --   --   --   --   HGB 9.5*  < > 7.1* 9.9* 11.5* 12.4*  12.4* 11.1* 9.1*  HCT 30.4*  < > 22.4* 31.4* 37.0* 38*  38* 34* 28*  MCV 94.1  < > 95.3 93.2 93.7  --   --   --   PLT 243  < > 196 211 224 266  266 314 233  < > = values in this interval not displayed. Lab Results  Component Value Date   TSH 1.340 05/26/2014   Lab Results  Component Value Date   HGBA1C 5.6 08/07/2013   Lab Results  Component Value Date   CHOL 155 08/08/2013   HDL 64 08/08/2013   LDLCALC 74 08/08/2013   TRIG 86 08/08/2013   CHOLHDL 2.4 08/08/2013    Significant Diagnostic Results in last 30 days:  No results found.  Assessment/Plan  1. Essential hypertension -controlled -continue to monitor BMP  2. Chronic combined systolic and diastolic CHF, NYHA class 3 (HCC) -stable with 10 lb weight loss in the past two months -resident is palliative care and is requesting to have fluid restriction lifted -I will allow this has he is tolerating 1700 cc well with weight loss and overall poor p.o. Intake -continue lasix and aldactone -continue to monitor weights and report of gains >3 lbs in 3-5 days  3. Pressure ulcer -resolved -continue heel protectors  4. Failure to thrive in adult -noted low albumin at 2.8, weight loss, overall poor intake -continue to monitor weight and provide nutritional supplements (ensure)  5. Bipolar disorder in partial  remission, most recent episode unspecified type (HCC) -stable on depakote -lfts ok  6. Anemia of chronic disease/acute bleeding -complicated but acute bleeding episode last month with has resolved -no aggressive work up per family wishes -check CBC  7. LV (left ventricular) mural thrombus without MI (HCC) -noted, not on anticoagulation due to bleeding and family wishes to avoid aggressive care   Family/ staff Communication: discussed with nurse  Labs/tests ordered:  CBC

## 2015-12-31 ENCOUNTER — Inpatient Hospital Stay (HOSPITAL_COMMUNITY)
Admission: EM | Admit: 2015-12-31 | Discharge: 2016-01-03 | DRG: 871 | Disposition: A | Discharge status: Hospice | Payer: Medicare Other | Attending: Internal Medicine | Admitting: Internal Medicine

## 2015-12-31 ENCOUNTER — Encounter (HOSPITAL_COMMUNITY): Payer: Self-pay | Admitting: Emergency Medicine

## 2015-12-31 ENCOUNTER — Emergency Department (HOSPITAL_COMMUNITY): Payer: Medicare Other

## 2015-12-31 DIAGNOSIS — E875 Hyperkalemia: Secondary | ICD-10-CM

## 2015-12-31 DIAGNOSIS — Z7189 Other specified counseling: Secondary | ICD-10-CM

## 2015-12-31 DIAGNOSIS — Z9861 Coronary angioplasty status: Secondary | ICD-10-CM | POA: Diagnosis not present

## 2015-12-31 DIAGNOSIS — Z825 Family history of asthma and other chronic lower respiratory diseases: Secondary | ICD-10-CM | POA: Diagnosis not present

## 2015-12-31 DIAGNOSIS — Z515 Encounter for palliative care: Secondary | ICD-10-CM | POA: Insufficient documentation

## 2015-12-31 DIAGNOSIS — D649 Anemia, unspecified: Secondary | ICD-10-CM | POA: Diagnosis present

## 2015-12-31 DIAGNOSIS — E872 Acidosis: Secondary | ICD-10-CM | POA: Diagnosis present

## 2015-12-31 DIAGNOSIS — I959 Hypotension, unspecified: Secondary | ICD-10-CM | POA: Diagnosis present

## 2015-12-31 DIAGNOSIS — Z87891 Personal history of nicotine dependence: Secondary | ICD-10-CM

## 2015-12-31 DIAGNOSIS — Z8249 Family history of ischemic heart disease and other diseases of the circulatory system: Secondary | ICD-10-CM

## 2015-12-31 DIAGNOSIS — Z720 Tobacco use: Secondary | ICD-10-CM | POA: Diagnosis present

## 2015-12-31 DIAGNOSIS — R109 Unspecified abdominal pain: Secondary | ICD-10-CM | POA: Diagnosis not present

## 2015-12-31 DIAGNOSIS — R627 Adult failure to thrive: Secondary | ICD-10-CM | POA: Diagnosis present

## 2015-12-31 DIAGNOSIS — I2510 Atherosclerotic heart disease of native coronary artery without angina pectoris: Secondary | ICD-10-CM | POA: Diagnosis present

## 2015-12-31 DIAGNOSIS — Z95810 Presence of automatic (implantable) cardiac defibrillator: Secondary | ICD-10-CM | POA: Diagnosis not present

## 2015-12-31 DIAGNOSIS — Z681 Body mass index (BMI) 19 or less, adult: Secondary | ICD-10-CM

## 2015-12-31 DIAGNOSIS — I5042 Chronic combined systolic (congestive) and diastolic (congestive) heart failure: Secondary | ICD-10-CM | POA: Diagnosis present

## 2015-12-31 DIAGNOSIS — A419 Sepsis, unspecified organism: Secondary | ICD-10-CM | POA: Diagnosis not present

## 2015-12-31 DIAGNOSIS — R1084 Generalized abdominal pain: Secondary | ICD-10-CM

## 2015-12-31 DIAGNOSIS — I255 Ischemic cardiomyopathy: Secondary | ICD-10-CM | POA: Diagnosis present

## 2015-12-31 DIAGNOSIS — Z79899 Other long term (current) drug therapy: Secondary | ICD-10-CM | POA: Diagnosis not present

## 2015-12-31 DIAGNOSIS — G35 Multiple sclerosis: Secondary | ICD-10-CM | POA: Diagnosis present

## 2015-12-31 DIAGNOSIS — Z7982 Long term (current) use of aspirin: Secondary | ICD-10-CM

## 2015-12-31 DIAGNOSIS — I10 Essential (primary) hypertension: Secondary | ICD-10-CM

## 2015-12-31 DIAGNOSIS — J9601 Acute respiratory failure with hypoxia: Secondary | ICD-10-CM | POA: Diagnosis present

## 2015-12-31 DIAGNOSIS — I110 Hypertensive heart disease with heart failure: Secondary | ICD-10-CM | POA: Diagnosis present

## 2015-12-31 DIAGNOSIS — F319 Bipolar disorder, unspecified: Secondary | ICD-10-CM | POA: Diagnosis present

## 2015-12-31 DIAGNOSIS — R0602 Shortness of breath: Secondary | ICD-10-CM | POA: Diagnosis present

## 2015-12-31 DIAGNOSIS — R0600 Dyspnea, unspecified: Secondary | ICD-10-CM | POA: Diagnosis not present

## 2015-12-31 DIAGNOSIS — Z841 Family history of disorders of kidney and ureter: Secondary | ICD-10-CM | POA: Diagnosis not present

## 2015-12-31 DIAGNOSIS — E785 Hyperlipidemia, unspecified: Secondary | ICD-10-CM

## 2015-12-31 DIAGNOSIS — R1310 Dysphagia, unspecified: Secondary | ICD-10-CM | POA: Insufficient documentation

## 2015-12-31 DIAGNOSIS — E43 Unspecified severe protein-calorie malnutrition: Secondary | ICD-10-CM | POA: Diagnosis present

## 2015-12-31 DIAGNOSIS — Z66 Do not resuscitate: Secondary | ICD-10-CM | POA: Diagnosis present

## 2015-12-31 DIAGNOSIS — K920 Hematemesis: Secondary | ICD-10-CM | POA: Diagnosis present

## 2015-12-31 DIAGNOSIS — K559 Vascular disorder of intestine, unspecified: Secondary | ICD-10-CM | POA: Diagnosis present

## 2015-12-31 DIAGNOSIS — J690 Pneumonitis due to inhalation of food and vomit: Secondary | ICD-10-CM | POA: Diagnosis present

## 2015-12-31 DIAGNOSIS — I240 Acute coronary thrombosis not resulting in myocardial infarction: Secondary | ICD-10-CM | POA: Diagnosis present

## 2015-12-31 DIAGNOSIS — N179 Acute kidney failure, unspecified: Secondary | ICD-10-CM | POA: Diagnosis present

## 2015-12-31 DIAGNOSIS — Z8673 Personal history of transient ischemic attack (TIA), and cerebral infarction without residual deficits: Secondary | ICD-10-CM

## 2015-12-31 DIAGNOSIS — I513 Intracardiac thrombosis, not elsewhere classified: Secondary | ICD-10-CM

## 2015-12-31 DIAGNOSIS — K922 Gastrointestinal hemorrhage, unspecified: Secondary | ICD-10-CM | POA: Diagnosis not present

## 2015-12-31 DIAGNOSIS — Z950 Presence of cardiac pacemaker: Secondary | ICD-10-CM

## 2015-12-31 DIAGNOSIS — J9600 Acute respiratory failure, unspecified whether with hypoxia or hypercapnia: Secondary | ICD-10-CM | POA: Diagnosis present

## 2015-12-31 LAB — TYPE AND SCREEN
ABO/RH(D): O NEG
ANTIBODY SCREEN: NEGATIVE

## 2015-12-31 LAB — COMPREHENSIVE METABOLIC PANEL
ALBUMIN: 2.3 g/dL — AB (ref 3.5–5.0)
ALT: 12 U/L — ABNORMAL LOW (ref 17–63)
ANION GAP: 13 (ref 5–15)
AST: 36 U/L (ref 15–41)
Alkaline Phosphatase: 57 U/L (ref 38–126)
BILIRUBIN TOTAL: 0.7 mg/dL (ref 0.3–1.2)
BUN: 38 mg/dL — ABNORMAL HIGH (ref 6–20)
CO2: 21 mmol/L — ABNORMAL LOW (ref 22–32)
Calcium: 8.7 mg/dL — ABNORMAL LOW (ref 8.9–10.3)
Chloride: 103 mmol/L (ref 101–111)
Creatinine, Ser: 1.26 mg/dL — ABNORMAL HIGH (ref 0.61–1.24)
GFR calc Af Amer: >60 mL/min (ref >60)
GFR calc non Af Amer: 57 mL/min — ABNORMAL LOW (ref >60)
GLUCOSE: 146 mg/dL — AB (ref 65–99)
POTASSIUM: 5.6 mmol/L — AB (ref 3.5–5.1)
Sodium: 137 mmol/L (ref 135–145)
TOTAL PROTEIN: 9 g/dL — AB (ref 6.5–8.1)

## 2015-12-31 LAB — I-STAT CG4 LACTIC ACID, ED: LACTIC ACID, VENOUS: 4.38 mmol/L — AB (ref 0.5–2.0)

## 2015-12-31 LAB — CBC WITH DIFFERENTIAL/PLATELET
BASOS ABS: 0 10*3/uL (ref 0.0–0.1)
Basophils Relative: 0 %
EOS PCT: 0 %
Eosinophils Absolute: 0 10*3/uL (ref 0.0–0.7)
HEMATOCRIT: 40.4 % (ref 39.0–52.0)
Hemoglobin: 12.4 g/dL — ABNORMAL LOW (ref 13.0–17.0)
LYMPHS ABS: 1.4 10*3/uL (ref 0.7–4.0)
Lymphocytes Relative: 6 %
MCH: 28.4 pg (ref 26.0–34.0)
MCHC: 30.7 g/dL (ref 30.0–36.0)
MCV: 92.4 fL (ref 78.0–100.0)
MONO ABS: 1.6 10*3/uL — AB (ref 0.1–1.0)
Monocytes Relative: 7 %
Neutro Abs: 20 10*3/uL — ABNORMAL HIGH (ref 1.7–7.7)
Neutrophils Relative %: 87 %
Platelets: 333 10*3/uL (ref 150–400)
RBC: 4.37 MIL/uL (ref 4.22–5.81)
RDW: 16.1 % — AB (ref 11.5–15.5)
WBC Morphology: INCREASED
WBC: 23 10*3/uL — AB (ref 4.0–10.5)

## 2015-12-31 LAB — I-STAT CHEM 8, ED
BUN: 49 mg/dL — ABNORMAL HIGH (ref 6–20)
CALCIUM ION: 1.02 mmol/L — AB (ref 1.13–1.30)
CREATININE: 1.2 mg/dL (ref 0.61–1.24)
Chloride: 103 mmol/L (ref 101–111)
GLUCOSE: 149 mg/dL — AB (ref 65–99)
HCT: 45 % (ref 39.0–52.0)
HEMOGLOBIN: 15.3 g/dL (ref 13.0–17.0)
Potassium: 5.5 mmol/L — ABNORMAL HIGH (ref 3.5–5.1)
Sodium: 138 mmol/L (ref 135–145)
TCO2: 23 mmol/L (ref 0–100)

## 2015-12-31 LAB — I-STAT TROPONIN, ED: Troponin i, poc: 0.01 ng/mL (ref 0.00–0.08)

## 2015-12-31 LAB — ABO/RH: ABO/RH(D): O NEG

## 2015-12-31 LAB — PROTIME-INR
INR: 1.21 (ref 0.00–1.49)
PROTHROMBIN TIME: 15.5 s — AB (ref 11.6–15.2)

## 2015-12-31 MED ORDER — PANTOPRAZOLE SODIUM 40 MG IV SOLR
40.0000 mg | Freq: Two times a day (BID) | INTRAVENOUS | Status: DC
  Administered 2016-01-01 – 2016-01-02 (×3): 40 mg via INTRAVENOUS
  Filled 2015-12-31 (×4): qty 40

## 2015-12-31 MED ORDER — SODIUM CHLORIDE 0.9 % IV BOLUS (SEPSIS): 500.0000 mL | Freq: Once | INTRAVENOUS | Status: DC

## 2015-12-31 MED ORDER — VANCOMYCIN HCL IN DEXTROSE 1-5 GM/200ML-% IV SOLN
1000.0000 mg | Freq: Once | INTRAVENOUS | Status: AC
  Administered 2015-12-31: 1000 mg via INTRAVENOUS
  Filled 2015-12-31: qty 200

## 2015-12-31 MED ORDER — ATORVASTATIN CALCIUM 40 MG PO TABS
40.0000 mg | ORAL_TABLET | Freq: Every day | ORAL | Status: DC
  Administered 2016-01-01: 40 mg via ORAL
  Filled 2015-12-31: qty 1

## 2015-12-31 MED ORDER — CARVEDILOL 25 MG PO TABS
25.0000 mg | ORAL_TABLET | Freq: Two times a day (BID) | ORAL | Status: DC
  Filled 2015-12-31 (×2): qty 1

## 2015-12-31 MED ORDER — HYDROXYZINE HCL 50 MG/ML IM SOLN
25.0000 mg | Freq: Four times a day (QID) | INTRAMUSCULAR | Status: DC | PRN
  Filled 2015-12-31: qty 0.5

## 2015-12-31 MED ORDER — VANCOMYCIN HCL IN DEXTROSE 750-5 MG/150ML-% IV SOLN
750.0000 mg | INTRAVENOUS | Status: DC
  Administered 2016-01-01: 750 mg via INTRAVENOUS
  Filled 2015-12-31 (×3): qty 150

## 2015-12-31 MED ORDER — FAMOTIDINE 20 MG PO TABS
20.0000 mg | ORAL_TABLET | Freq: Every day | ORAL | Status: DC
  Administered 2016-01-01: 20 mg via ORAL
  Filled 2015-12-31: qty 1

## 2015-12-31 MED ORDER — SODIUM CHLORIDE 0.9 % IV BOLUS (SEPSIS)
500.0000 mL | Freq: Once | INTRAVENOUS | Status: AC
  Administered 2015-12-31: 500 mL via INTRAVENOUS

## 2015-12-31 MED ORDER — FLUOXETINE HCL 20 MG PO CAPS
20.0000 mg | ORAL_CAPSULE | Freq: Every day | ORAL | Status: DC
  Administered 2016-01-01: 20 mg via ORAL
  Filled 2015-12-31: qty 1

## 2015-12-31 MED ORDER — AQUAPHOR EX OINT
1.0000 "application " | TOPICAL_OINTMENT | Freq: Every day | CUTANEOUS | Status: DC
  Administered 2016-01-01 (×2): 1 via TOPICAL
  Filled 2015-12-31 (×2): qty 50

## 2015-12-31 MED ORDER — PIPERACILLIN-TAZOBACTAM 3.375 G IVPB
3.3750 g | Freq: Three times a day (TID) | INTRAVENOUS | Status: DC
  Administered 2016-01-01 – 2016-01-03 (×7): 3.375 g via INTRAVENOUS
  Filled 2015-12-31 (×11): qty 50

## 2015-12-31 MED ORDER — SODIUM CHLORIDE 0.9 % IV SOLN
INTRAVENOUS | Status: DC
  Administered 2016-01-01 (×2): via INTRAVENOUS

## 2015-12-31 MED ORDER — PIPERACILLIN-TAZOBACTAM 3.375 G IVPB 30 MIN
3.3750 g | Freq: Once | INTRAVENOUS | Status: AC
  Administered 2015-12-31: 3.375 g via INTRAVENOUS
  Filled 2015-12-31: qty 50

## 2015-12-31 MED ORDER — PANTOPRAZOLE SODIUM 40 MG IV SOLR
40.0000 mg | Freq: Once | INTRAVENOUS | Status: AC
  Administered 2015-12-31: 40 mg via INTRAVENOUS
  Filled 2015-12-31: qty 40

## 2015-12-31 MED ORDER — TAMSULOSIN HCL 0.4 MG PO CAPS
0.4000 mg | ORAL_CAPSULE | Freq: Every day | ORAL | Status: DC
  Administered 2016-01-01: 0.4 mg via ORAL
  Filled 2015-12-31: qty 1

## 2015-12-31 MED ORDER — ERYTHROMYCIN 5 MG/GM OP OINT
1.0000 | TOPICAL_OINTMENT | Freq: Four times a day (QID) | OPHTHALMIC | Status: DC
Start: 2015-12-31 — End: 2016-01-03
  Administered 2016-01-01 (×4): 1 via OPHTHALMIC
  Filled 2015-12-31 (×4): qty 3.5

## 2015-12-31 MED ORDER — SODIUM CHLORIDE 0.9 % IV SOLN
1.0000 g | Freq: Once | INTRAVENOUS | Status: AC
  Administered 2015-12-31: 1 g via INTRAVENOUS
  Filled 2015-12-31: qty 10

## 2015-12-31 MED ORDER — DM-GUAIFENESIN ER 30-600 MG PO TB12
1.0000 | ORAL_TABLET | Freq: Two times a day (BID) | ORAL | Status: DC
  Administered 2016-01-01 (×2): 1 via ORAL
  Filled 2015-12-31 (×3): qty 1

## 2015-12-31 MED ORDER — ACETAMINOPHEN 325 MG PO TABS: 650.0000 mg | ORAL_TABLET | ORAL | Status: DC | PRN

## 2015-12-31 MED ORDER — LEVALBUTEROL HCL 1.25 MG/0.5ML IN NEBU
1.2500 mg | INHALATION_SOLUTION | Freq: Four times a day (QID) | RESPIRATORY_TRACT | Status: DC
  Administered 2016-01-01 (×2): 1.25 mg via RESPIRATORY_TRACT
  Filled 2015-12-31 (×8): qty 0.5

## 2015-12-31 MED ORDER — ONDANSETRON HCL 4 MG/2ML IJ SOLN
4.0000 mg | Freq: Once | INTRAMUSCULAR | Status: AC | PRN
  Administered 2015-12-31: 4 mg via INTRAVENOUS
  Filled 2015-12-31: qty 2

## 2015-12-31 MED ORDER — SODIUM CHLORIDE 0.9 % IV BOLUS (SEPSIS)
250.0000 mL | Freq: Once | INTRAVENOUS | Status: AC
  Administered 2016-01-01: 250 mL via INTRAVENOUS

## 2015-12-31 MED ORDER — VALPROIC ACID 250 MG PO CAPS
250.0000 mg | ORAL_CAPSULE | Freq: Two times a day (BID) | ORAL | Status: DC
  Administered 2016-01-01 (×2): 250 mg via ORAL
  Filled 2015-12-31 (×8): qty 1

## 2015-12-31 MED ORDER — DEXTROSE 50 % IV SOLN
50.0000 mL | Freq: Once | INTRAVENOUS | Status: AC
  Administered 2016-01-01: 50 mL via INTRAVENOUS
  Filled 2015-12-31: qty 50

## 2015-12-31 MED ORDER — INSULIN ASPART 100 UNIT/ML ~~LOC~~ SOLN
5.0000 [IU] | Freq: Once | SUBCUTANEOUS | Status: AC
  Administered 2016-01-01: 5 [IU] via SUBCUTANEOUS
  Filled 2015-12-31: qty 1

## 2015-12-31 MED ORDER — SODIUM POLYSTYRENE SULFONATE 15 GM/60ML PO SUSP
30.0000 g | Freq: Once | ORAL | Status: AC
  Administered 2016-01-01: 30 g via ORAL
  Filled 2015-12-31: qty 120

## 2015-12-31 MED ORDER — ONDANSETRON HCL 4 MG/2ML IJ SOLN: 4.0000 mg | Freq: Three times a day (TID) | INTRAMUSCULAR | Status: DC | PRN

## 2015-12-31 NOTE — ED Notes (Signed)
Pt in EMS from [place]. Per facility N/V and SOB starting today. Had coffee ground like emesis. 86% on 4L [place] with fine crackles in bases. Last BP 100/70. Denies taking thinners

## 2015-12-31 NOTE — H&P (Addendum)
History and Physical    [name]:[id number] DOB: 19-Feb-1946 DOA: 12/31/2015  Referring MD/NP/PA:   PCP: Kirt Boys, DO   Outpatient Specialists: None Patient coming from:  SNF   Chief Complaint: Hematemesis, SOB and abdominal pain  HPI: [name] is a 70 y.o. 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   Patient was recently hospitalized from 2/13-2/20 because of hematemesis, UTI and aspiration pneumonia. Patient refused further GI workup. He was transfused with 2 units of blood, hemoglobin back to 9.1 on 11/10/15. Pt reports that he had 4 episode of hematemesis with coffee ground materials in the past 2 days. He has generalized weakness. He has mild diffuse abdominal pain. He reports that he has diarrhea, but could not give detailed information. He is on several laxatives including MiraLAX, magnesium hydroxide and Dulcolax. Pt has cough, SOB, but no CP. He denies symptoms of UTI. No fever or chills. He moves all extremities.  ED Course: pt was found to have oxygen desaturation to 86% on room air, INR1.21, WBC 23, temperature normal, tachycardia, tachypnea, potassium of 5.6, negative troponin, worsening renal function. Hemoglobin 9.1 on 11/10/15-->12.4 today. Lactate is 4.38-->3.9. Chest x-ray showed right basilar atelectasis. Patient is admitted to inpatient for further events and treatment.  # CT angiogram showed Acute appearing embolic/thrombotic occlusion of the distal SMA and jejunal branches. Associated small bowel  ischemic wall thickening. Right inguinal hernia containing thickened small bowel. Bibasilar pneumonia or aspiration. Congenital non rotation of bowel.  Can patient participate in ADLs? None  Review of Systems:   General: no fevers, chills, no changes in body weight, has poor appetite, has fatigue HEENT: no blurry vision, hearing changes or sore throat Pulm: has dyspnea, coughing, no heezing CV: no chest pain, no palpitations Abd: has nausea, vomiting, abdominal pain, diarrhea, and hematemesis GU: no dysuria, burning on urination, increased urinary frequency, hematuria  Ext: no leg edema Neuro: no unilateral weakness, numbness, or tingling, no vision change or hearing loss Skin: no rash MSK: No muscle spasm, no deformity, no limitation of range of movement in spin Heme: No easy bruising.  Travel history: No recent long distant travel.  Allergy:  Allergies  Allergen Reactions  . Meperidine Hcl Other (See Comments)    unknown    Past Medical History  Diagnosis Date  . Coronary artery disease     s/p PCI of LAD with BMS 04/03/06 at [facility]  . Hyperlipidemia   . Hypertension   . Cerebrovascular accident [facility])     history of cerebrovascular accident with residual left hemiparesis in 2002, seen at [facility]  . Multiple sclerosis (HCC)   . Ischemic cardiomyopathy     s/p BiV ICD implanted 2007 at [facility]  . Chronic combined systolic and diastolic congestive heart failure, NYHA class 3 (HCC)   . Bipolar disorder (HCC)   . Failure to thrive (0-17)     Past Surgical History  Procedure Laterality Date  . Tonsillectomy    . Vasectomy    . Implant of automatic cardioverter defibrillator      December 26, 2004 MDT BiV ICD implanted by Dr  Chales Abrahams in [place]  . Ptca      PCI LAD with BMS 04/03/06  . Ep implantable device N/A 01/18/2015    Procedure: BIV PPM Downgrade;  Surgeon: Hillis Range, MD;  Location: [facility] INVASIVE CV LAB;  Service: Cardiovascular;   Laterality: N/A;    Social History:  reports that he quit smoking about 4 years ago. His smoking use included Cigarettes. He has a 20 pack-year smoking history. He has never used smokeless tobacco. He reports that he drinks alcohol. He reports that he does not use illicit drugs.  Family History:  Family History  Problem Relation Age of Onset  . Heart disease Mother   . Kidney disease Father   . COPD Sister   . Cancer Neg Hx   . Diabetes Neg Hx      Prior to Admission medications   Medication Sig Start Date End Date Taking? Authorizing Provider  acetaminophen (TYLENOL) 325 MG tablet Take 2 tablets (650 mg total) by mouth every 4 (four) hours as needed for mild pain, moderate pain, fever or headache. 10/29/15  Yes Starleen Arms, MD  atorvastatin (LIPITOR) 40 MG tablet TAKE ONE TABLET BY MOUTH ONCE DAILY 08/17/15  Yes Veryl Speak, FNP  bisacodyl (DULCOLAX) 10 MG suppository Place 10 mg rectally as needed for moderate constipation.   Yes Historical Provider, MD  carvedilol (COREG) 25 MG tablet TAKE ONE TABLET BY MOUTH TWICE DAILY WITH MEALS 05/10/15  Yes Veryl Speak, FNP  erythromycin ophthalmic ointment Place 1 application into the right eye 4 (four) times daily. 08/07/14  Yes Veryl Speak, FNP  famotidine (PEPCID) 20 MG tablet TAKE ONE TABLET BY MOUTH ONCE DAILY 08/06/15  Yes Veryl Speak, FNP  FLUoxetine (PROZAC) 20 MG capsule TAKE ONE CAPSULE BY MOUTH ONCE DAILY 08/06/15  Yes Veryl Speak, FNP  furosemide (LASIX) 20 MG tablet TAKE ONE TABLET BY MOUTH ONCE DAILY 08/17/15  Yes Veryl Speak, FNP  magnesium hydroxide (MILK OF MAGNESIA) 400 MG/5ML suspension Take 30 mLs by mouth daily as needed for mild constipation.   Yes Historical Provider, MD  mineral oil-hydrophilic petrolatum (AQUAPHOR) ointment Apply 1 application topically at bedtime.   Yes Historical Provider, MD  ondansetron (ZOFRAN) 4 MG tablet Take 4 mg by mouth every 6 (six) hours as needed for vomiting.    Yes Historical Provider, MD  pantoprazole (PROTONIX) 40 MG tablet Take 1 tablet (40 mg total) by mouth daily. 10/29/15  Yes Starleen Arms, MD  polyethylene glycol (MIRALAX / GLYCOLAX) packet Take 17 g by mouth daily as needed for moderate constipation. 10/29/15  Yes Starleen Arms, MD  saccharomyces boulardii (FLORASTOR) 250 MG capsule Take 1 capsule (250 mg total) by mouth 2 (two) times daily. 10/29/15  Yes Starleen Arms, MD  Sodium Phosphates (RA SALINE ENEMA) 19-7 GM/118ML ENEM Place 1 each rectally as needed (for constipation).   Yes Historical Provider, MD  spironolactone (ALDACTONE) 25 MG tablet TAKE ONE TABLET BY MOUTH ONCE DAILY 08/06/15  Yes Veryl Speak, FNP  tamsulosin (FLOMAX) 0.4 MG CAPS capsule Take 1 capsule (0.4 mg total) by mouth daily after supper. 10/29/15  Yes Starleen Arms, MD  valproic acid (DEPAKENE) 250 MG capsule TAKE ONE CAPSULE BY MOUTH TWICE DAILY 01/22/15  Yes Veryl Speak, FNP  antiseptic oral rinse (CPC / CETYLPYRIDINIUM CHLORIDE 0.05%) 0.05 % LIQD solution 7 mLs by Mouth Rinse route 2 (two) times daily. Patient not taking: Reported on 12/31/2015 10/29/15  Starleen Arms, MD    Physical Exam: Filed Vitals:   01/01/16 0030 01/01/16 0115 01/01/16 0130 01/01/16 0200  BP: 108/74 100/72 112/76 106/67  Pulse: 93 83 95 95  Temp:      TempSrc:      Resp: 22 19 22 20   Height:      Weight:      SpO2: 91% 94% 94% 93%   General: Not in acute distress. Cachectic looking HEENT:       Eyes: PERRL, EOMI, no scleral icterus.       ENT: No discharge from the ears and nose, no pharynx injection, no tonsillar enlargement.        Neck: No JVD, no bruit, no mass felt. Heme: No neck lymph node enlargement. Cardiac: S1/S2, RRR, No murmurs, No gallops or rubs. Pulm: has diffuse crackles bilaterally. No wheezing or rubs. Abd: Soft, nondistended, mild tenderness diffusely, no rebound pain, no organomegaly, BS present. GU: No hematuria Ext: No pitting leg  edema bilaterally. 2+DP/PT pulse bilaterally. Musculoskeletal: No joint deformities, No joint redness or warmth, no limitation of ROM in spin. Skin: No rashes.  Neuro: Alert, oriented X3, cranial nerves II-XII grossly intact, moves all extremities normally. Psych: Patient is not psychotic, no suicidal or hemocidal ideation.  Labs on Admission: I have personally reviewed following labs and imaging studies  CBC:  Recent Labs Lab 12/31/15 2136 12/31/15 2145  WBC 23.0*  --   NEUTROABS 20.0*  --   HGB 12.4* 15.3  HCT 40.4 45.0  MCV 92.4  --   PLT 333  --    Basic Metabolic Panel:  Recent Labs Lab 12/31/15 2136 12/31/15 2145  NA 137 138  K 5.6* 5.5*  CL 103 103  CO2 21*  --   GLUCOSE 146* 149*  BUN 38* 49*  CREATININE 1.26* 1.20  CALCIUM 8.7*  --    GFR: Estimated Creatinine Clearance: 37.8 mL/min (by C-G formula based on Cr of 1.2). Liver Function Tests:  Recent Labs Lab 12/31/15 2136  AST 36  ALT 12*  ALKPHOS 57  BILITOT 0.7  PROT 9.0*  ALBUMIN 2.3*    Recent Labs Lab 01/01/16 0113  LIPASE 16   No results for input(s): AMMONIA in the last 168 hours. Coagulation Profile:  Recent Labs Lab 12/31/15 2136  INR 1.21   Cardiac Enzymes: No results for input(s): CKTOTAL, CKMB, CKMBINDEX, TROPONINI in the last 168 hours. BNP (last 3 results) No results for input(s): PROBNP in the last 8760 hours. HbA1C: No results for input(s): HGBA1C in the last 72 hours. CBG: No results for input(s): GLUCAP in the last 168 hours. Lipid Profile: No results for input(s): CHOL, HDL, LDLCALC, TRIG, CHOLHDL, LDLDIRECT in the last 72 hours. Thyroid Function Tests: No results for input(s): TSH, T4TOTAL, FREET4, T3FREE, THYROIDAB in the last 72 hours. Anemia Panel: No results for input(s): VITAMINB12, FOLATE, FERRITIN, TIBC, IRON, RETICCTPCT in the last 72 hours. Urine analysis:    Component Value Date/Time   COLORURINE RED* 10/22/2015 2125   APPEARANCEUR TURBID*  10/22/2015 2125   LABSPEC 1.024 10/22/2015 2125   PHURINE 6.0 10/22/2015 2125   GLUCOSEU NEGATIVE 10/22/2015 2125   HGBUR LARGE* 10/22/2015 2125   BILIRUBINUR MODERATE* 10/22/2015 2125   KETONESUR 15* 10/22/2015 2125   PROTEINUR 100* 10/22/2015 2125   UROBILINOGEN 1.0 05/21/2014 1844   NITRITE POSITIVE* 10/22/2015 2125   LEUKOCYTESUR MODERATE* 10/22/2015 2125   Sepsis Labs: @LABRCNTIP (procalcitonin:4,lacticidven:4) )No results found for this or any previous visit (from the  past 240 hour(s)).   Radiological Exams on Admission: Dg Chest Portable 1 View  12/31/2015  CLINICAL DATA:  Acute onset of hypoxia.  Initial encounter. EXAM: PORTABLE CHEST 1 VIEW COMPARISON:  Chest radiograph from 10/25/2015 FINDINGS: The lungs are well-aerated. Minimal right basilar atelectasis is noted. There is no evidence of pleural effusion or pneumothorax. The cardiomediastinal silhouette is normal in size. A pacemaker/AICD is noted overlying the left chest wall, with leads ending overlying the right atrium, right ventricle and coronary sinus. No acute osseous abnormalities are seen. IMPRESSION: Minimal right basilar atelectasis noted.  Lungs otherwise clear. Electronically Signed   By: Roanna Raider M.D.   On: 12/31/2015 21:42   Ct Angio Abd/pel W/ And/or W/o  01/01/2016  CLINICAL DATA:  Abdominal pain, generalized. EXAM: CTA ABDOMEN AND PELVIS wITHOUT AND WITH CONTRAST TECHNIQUE: Multidetector CT imaging of the abdomen and pelvis was performed using the standard protocol during bolus administration of intravenous contrast. Multiplanar reconstructed images and MIPs were obtained and reviewed to evaluate the vascular anatomy. CONTRAST:  80 cc Isovue 370 intravenous COMPARISON:  08/29/2010 FINDINGS: Lower chest and abdominal wall: Right middle and right more than left lower lobe airspace disease consistent with pneumonia or aspiration. No embolic source seen in the heart. Right inguinal hernia containing  congested/thickened small bowel. Biventricular ICD/ pacer. Hepatobiliary: No focal liver abnormality.No evidence of biliary obstruction or stone. Pancreas: Chronically dilated main pancreatic duct at 5 mm without visible stone or mass. Spleen: Unremarkable. Adrenals/Urinary Tract: Negative adrenals. Bilateral renal cortical scarring. No hydronephrosis or stone. Unremarkable bladder. Reproductive:No acute findings. Stomach/Bowel: Pelvic small bowel loops are circumferentially thickened with prominent mucosal enhancement and submucosal edema. This also includes bowel loops in the right inguinal hernia sac. No perforation or nonenhancing segment of bowel. Prominent rectal stool volume. 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 Probable appendectomy. Vascular/Lymphatic: There is abrupt occlusion at the level of the distal SMA, branching into jejunal branches. Reconstitution at the ileocolic level. This is likely embolic. No other major branch occlusion is seen. Atherosclerosis. No mass or adenopathy. Peritoneal: No ascites or pneumoperitoneum. Musculoskeletal: No acute abnormalities. Remote L3 compression fracture. Critical Value/emergent results were called by telephone at the time of interpretation on 01/01/2016 at 1:41 am to Dr. Rhunette Croft, who verbally acknowledged these results. Review of the MIP images confirms the above findings. IMPRESSION: 1. Acute appearing embolic/thrombotic occlusion of the distal SMA and jejunal branches. Associated small bowel ischemic wall thickening. 2. Right inguinal hernia containing thickened small bowel. 3. Bibasilar pneumonia or aspiration. 4. Congenital non rotation of bowel. Electronically Signed   By: Marnee Spring M.D.   On: 01/01/2016 01:47     EKG: not done in ED yet, will get one.   Assessment/Plan Principal Problem:   Acute respiratory failure (HCC) Active Problems:   HLD (hyperlipidemia)   Bipolar disorder  (HCC)   Multiple sclerosis (HCC)   Essential hypertension   CAD S/P percutaneous coronary angioplasty   Chronic combined systolic and diastolic CHF, NYHA class 3 (HCC)   Tobacco abuse   History of stroke   Biventricular ICD (implantable cardioverter-defibrillator) in place   Protein-calorie malnutrition, severe (HCC)   LV (left ventricular) mural thrombus without MI (HCC)   Aspiration pneumonia (HCC)   Sepsis (HCC)   Hyperkalemia   Hematemesis   Abdominal pain   Ischemic bowel disease (HCC)  Addendum: Pt son called back.-->per his son, no any invasive procedure should be done  from now. Agreed to continue Abx and IVF now. Agreed to get palliative consult.  Acute hypoxic respiratory failure and sepsis: This likely due to possible  aspiration pneumonia, but can not r/u HCAP. He meets criteria for sepsis, with leukocytosis, tachycardia, tachypnea. Lactate is 4.38-->3.9. Currently hemodynamically stable.  - Will admit to SDU - IV Vancomycin and Zosyn - Mucinex for cough  - Xopenex Neb prn for SOB - Urine legionella and S. pneumococcal antigen - Follow up blood culture x2, sputum culture and respiratory virus panel, plus Flu pcr - will get Procalcitonin and trend lactic acid level per sepsis protocol - IVF: 500 x 2 of NS bolus in ED, followed by 50  mL per hour of NS (patient has EF 20-25%, limiting aggressive IV fluids treatment) - SLP   Coffee-ground emesis and anemia: had this issue in previous admission. GI consulted. patient he did not wish for any further workup regarding GI bleed. His AC is on held. Patient was transfused 2 units PRBC 2/18. Hemoglobin 9.1 on 11/10/15-->12.4 today -IV protonix bid -cbc q6h  Ischemic bowel disease: CT angiogram showed embolic/thrombotic occlusion of the distal SMA and jejunal branches, associated small bowel ischemic wall thickening.Pt states that he will not do any surgery or procedure until we discuss this issue with his friend, [name]  [phone]). Ms Annetta Maw did not pick up the phone. We asked pt again what he would like to do, he states that we need to wait to tomorrow morning to discuss with [name]. We obtained his son's number from SNF, and tried to reach his son, Roberta Kelly [phone]), but he did no pick up the phone. I left message to his son with my cell number. Pt is hemodynamically stable. Lactate is 4.38-->3.9. His EF is 20 to 25%. I do not think that he is a surgical candidate.   -IVF: received NS 500 cc x 2, will increased his NS rate from 50 to 75 cc/h now. -trend lactate. -he is not candidate for IV Heparin due to GIB  Chronic combined systolic and diastolic CHF/ischemic cardiomyopathy/CAD/ICD: 2-D echo 2014 showed LVEF 20-25% with diffuse hypokinesis. No leg edema or JVD on admisison. CHF is compensated -Hold Lasix and spironolactone due to sepsis -Continue Coreg -Check BNP  Diarrhea: on laxatives. -will hold all laxatives -If still have diarrhea, to check  C. difficile PCR -IVF  Hx of LV thrombus: AC is on held. Cardiology consulted in previous admission. Discussed risks and benefits with the family, they did not wish for aggressive workup per discharge summary. -Hold AC due to active   Essential hypertension: bp 101/79 -observe bp closely -hold diuretic  Hyperlipidemia:  - Continue statins.  CVA:  - Plavix on hold  -continue lipitor  Bipolar disorder:  - Continue fluoxetine and valproate.   Protein calorie malnutrition and dult failure to thrive:  -consult to nutrition  Goals of care:  Had discussion with patient, agree for talk to palliative care. -palliative care consulte  DVT ppx: SCD Code Status: DNR Family Communication: Yes, patient's friend at bed side Disposition Plan:  Anticipate discharge back to previous SNF Consults called: none Admission status:  (inpatient/SDU)   Date of Service 01/01/2016    Lorretta Harp [facility] Pager [phone]  If  7PM-7AM, please contact night-coverage [email or web address] Password [email or web address] 01/01/2016, 2:20 AM

## 2015-12-31 NOTE — Progress Notes (Signed)
Pharmacy Antibiotic Note  [name] is a 70 y.o. male admitted on 12/31/2015 with pneumonia.  Pharmacy has been consulted for vancomycin and zosyn dosing. Pt is afebrile and WBC is elevated at 23. SCr is 1.2.   Plan: - Vancomycin 1gm IV x 1 then 750mg  IV Q24H - Zosyn 3.375gm IV Q8H (4 hr inf) - F/u renal fxn, C&S, clinical status and trough at SS  Height: 5\' 5"  (165.1 cm) Weight: 100 lb (45.36 kg) IBW/kg (Calculated) : 61.5  Temp (24hrs), Avg:97.6 F (36.4 C), Min:97.6 F (36.4 C), Max:97.6 F (36.4 C)   Recent Labs Lab 12/31/15 2136 12/31/15 2145  WBC 23.0*  --   CREATININE  --  1.20    Estimated Creatinine Clearance: 37.8 mL/min (by C-G formula based on Cr of 1.2).    Allergies  Allergen Reactions  . Meperidine Hcl Other (See Comments)    unknown    Antimicrobials this admission: Vanc 4/24>> Zosyn 4/24>>  Dose adjustments this admission: N/A  Microbiology results: Pending  Thank you for allowing pharmacy to be a part of this patient's care.  Oceanna Arruda, Drake Leach 12/31/2015 10:28 PM

## 2015-12-31 NOTE — ED Notes (Signed)
cbg is 146

## 2015-12-31 NOTE — ED Provider Notes (Signed)
CSN: [id number]     Arrival date & time 12/31/15  2106 History   None    Chief Complaint  Patient presents with  . GI Bleeding  . Shortness of Breath     (Consider location/radiation/quality/duration/timing/severity/associated sxs/prior Treatment) HPI   70 year old male with past medical history of hypertension, hyperlipidemia, coronary artery disease, and history of severe ischemic cardiomyopathy status post biventricular ICD placement, as well as history of recurrent upper GI bleeds who presents with coffee-ground emesis and general fatigue. The patient states that over the last 2 days, he has had 4-5 episodes of large-volume coffee-ground emesis. Denies any bright red blood. He has had mild epigastric pain during this time. He does take aspirin intermittently for his pain. Pain is made worse with eating and temporarily resolved with vomiting. Denies any fevers or chills. Denies any constipation, melena, or hematochezia. Per review of records, the patient does have a history of GI bleeds and has declined GI intervention in the past. Patient is DO NOT RESUSCITATE/DO NOT INTUBATE. He would be amenable, however, to antibiotics. Of note, he has a history of aspiration and friend is concerned that he may have aspirated earlier today during an episode of vomiting.  Past Medical History  Diagnosis Date  . Coronary artery disease     s/p PCI of LAD with BMS 04/03/06 at [facility]  . Hyperlipidemia   . Hypertension   . Cerebrovascular accident [facility])     history of cerebrovascular accident with residual left hemiparesis in 2002, seen at [facility]  . Multiple sclerosis (HCC)   . Ischemic cardiomyopathy     s/p BiV ICD implanted 2007 at [facility]  . Chronic combined systolic and diastolic congestive heart failure, NYHA class 3 (HCC)   . Bipolar disorder (HCC)   . Failure to thrive (0-17)    Past Surgical History  Procedure Laterality Date  . Tonsillectomy    .  Vasectomy    . Implant of automatic cardioverter defibrillator      December 26, 2004 MDT BiV ICD implanted by Dr Chales Abrahams in [facility]  . [facility]      PCI LAD with BMS 04/03/06  . Ep implantable device N/A 01/18/2015    Procedure: BIV PPM Downgrade;  Surgeon: Hillis Range, MD;  Location: [facility] INVASIVE CV LAB;  Service: Cardiovascular;  Laterality: N/A;   Family History  Problem Relation Age of Onset  . Heart disease Mother   . Kidney disease Father   . COPD Sister   . Cancer Neg Hx   . Diabetes Neg Hx    Social History  Substance Use Topics  . Smoking status: Former Smoker -- 0.50 packs/day for 40 years    Types: Cigarettes    Quit date: 10/13/2011  . Smokeless tobacco: Never Used     Comment: has tried to quit "many times" will continue to try to quit  . Alcohol Use: Yes     Comment: remote heavy ETOH, "years ago"    Review of Systems  Constitutional: Positive for fatigue. Negative for fever and chills.  HENT: Negative for congestion and rhinorrhea.   Eyes: Negative for visual disturbance.  Respiratory: Positive for cough and shortness of breath. Negative for wheezing.   Cardiovascular: Negative for chest pain and leg swelling.  Gastrointestinal: Positive for nausea, vomiting and abdominal pain. Negative for diarrhea.  Genitourinary: Negative for dysuria and flank pain.  Musculoskeletal: Negative for neck pain and neck stiffness.  Skin: Negative for rash.  Allergic/Immunologic: Negative for immunocompromised state.  Neurological: Negative for syncope, weakness and headaches.      Allergies  Meperidine hcl  Home Medications   Prior to Admission medications   Medication Sig Start Date End Date Taking? Authorizing Provider  acetaminophen (TYLENOL) 325 MG tablet Take 2 tablets (650 mg total) by mouth every 4 (four) hours as needed for mild pain, moderate pain, fever or headache. 10/29/15  Yes Starleen Arms, MD  atorvastatin (LIPITOR) 40 MG tablet TAKE ONE TABLET BY MOUTH ONCE  DAILY 08/17/15  Yes Veryl Speak, FNP  bisacodyl (DULCOLAX) 10 MG suppository Place 10 mg rectally as needed for moderate constipation.   Yes Historical Provider, MD  carvedilol (COREG) 25 MG tablet TAKE ONE TABLET BY MOUTH TWICE DAILY WITH MEALS 05/10/15  Yes Veryl Speak, FNP  erythromycin ophthalmic ointment Place 1 application into the right eye 4 (four) times daily. 08/07/14  Yes Veryl Speak, FNP  famotidine (PEPCID) 20 MG tablet TAKE ONE TABLET BY MOUTH ONCE DAILY 08/06/15  Yes Veryl Speak, FNP  FLUoxetine (PROZAC) 20 MG capsule TAKE ONE CAPSULE BY MOUTH ONCE DAILY 08/06/15  Yes Veryl Speak, FNP  furosemide (LASIX) 20 MG tablet TAKE ONE TABLET BY MOUTH ONCE DAILY 08/17/15  Yes Veryl Speak, FNP  magnesium hydroxide (MILK OF MAGNESIA) 400 MG/5ML suspension Take 30 mLs by mouth daily as needed for mild constipation.   Yes Historical Provider, MD  mineral oil-hydrophilic petrolatum (AQUAPHOR) ointment Apply 1 application topically at bedtime.   Yes Historical Provider, MD  ondansetron (ZOFRAN) 4 MG tablet Take 4 mg by mouth every 6 (six) hours as needed for vomiting.   Yes Historical Provider, MD  pantoprazole (PROTONIX) 40 MG tablet Take 1 tablet (40 mg total) by mouth daily. 10/29/15  Yes Starleen Arms, MD  polyethylene glycol (MIRALAX / GLYCOLAX) packet Take 17 g by mouth daily as needed for moderate constipation. 10/29/15  Yes Starleen Arms, MD  saccharomyces boulardii (FLORASTOR) 250 MG capsule Take 1 capsule (250 mg total) by mouth 2 (two) times daily. 10/29/15  Yes Starleen Arms, MD  Sodium Phosphates (RA SALINE ENEMA) 19-7 GM/118ML ENEM Place 1 each rectally as needed (for constipation).   Yes Historical Provider, MD  spironolactone (ALDACTONE) 25 MG tablet TAKE ONE TABLET BY MOUTH ONCE DAILY 08/06/15  Yes Veryl Speak, FNP  tamsulosin (FLOMAX) 0.4 MG CAPS capsule Take 1 capsule (0.4 mg total) by mouth daily after supper. 10/29/15  Yes Starleen Arms, MD  valproic acid (DEPAKENE) 250 MG capsule TAKE ONE CAPSULE BY MOUTH TWICE DAILY 01/22/15  Yes Veryl Speak, FNP  antiseptic oral rinse (CPC / CETYLPYRIDINIUM CHLORIDE 0.05%) 0.05 % LIQD solution 7 mLs by Mouth Rinse route 2 (two) times daily. Patient not taking: Reported on 12/31/2015 10/29/15   Leana Roe Elgergawy, MD   BP 115/84 mmHg  Pulse 93  Temp(Src) 99.2 F (37.3 C) (Rectal)  Resp 26  Ht 5\' 5"  (1.651 m)  Wt 45.36 kg  BMI 16.64 kg/m2  SpO2 93% Physical Exam  Constitutional: He is oriented to person, place, and time. He appears well-developed and well-nourished. No distress.  HENT:  Head: Normocephalic and atraumatic.  Mouth/Throat: No oropharyngeal exudate.  Eyes: Conjunctivae are normal. Pupils are equal, round, and reactive to light.  Neck: Normal range of motion. Neck supple.  Cardiovascular: Normal rate, regular rhythm, normal heart sounds and intact distal pulses.  Exam reveals no friction rub.   No  murmur heard. Pulmonary/Chest: Effort normal. No respiratory distress. He has no wheezes. He has rales.  Abdominal: Soft. He exhibits no distension. There is tenderness. There is no rebound and no guarding.  Musculoskeletal: He exhibits no edema.  Neurological: He is alert and oriented to person, place, and time. He exhibits normal muscle tone.  Skin: Skin is warm. No rash noted.  Nursing note and vitals reviewed.   ED Course  Procedures (including critical care time) Labs Review Labs Reviewed  CBC WITH DIFFERENTIAL/PLATELET - Abnormal; Notable for the following:    WBC 23.0 (*)    Hemoglobin 12.4 (*)    RDW 16.1 (*)    Neutro Abs 20.0 (*)    Monocytes Absolute 1.6 (*)    All other components within normal limits  COMPREHENSIVE METABOLIC PANEL - Abnormal; Notable for the following:    Potassium 5.6 (*)    CO2 21 (*)    Glucose, Bld 146 (*)    BUN 38 (*)    Creatinine, Ser 1.26 (*)    Calcium 8.7 (*)    Total Protein 9.0 (*)    Albumin 2.3 (*)     ALT 12 (*)    GFR calc non Af Amer 57 (*)    All other components within normal limits  PROTIME-INR - Abnormal; Notable for the following:    Prothrombin Time 15.5 (*)    All other components within normal limits  LACTIC ACID, PLASMA - Abnormal; Notable for the following:    Lactic Acid, Venous 3.9 (*)    All other components within normal limits  I-STAT CHEM 8, ED - Abnormal; Notable for the following:    Potassium 5.5 (*)    BUN 49 (*)    Glucose, Bld 149 (*)    Calcium, Ion 1.02 (*)    All other components within normal limits  I-STAT CG4 LACTIC ACID, ED - Abnormal; Notable for the following:    Lactic Acid, Venous 4.38 (*)    All other components within normal limits  CULTURE, BLOOD (ROUTINE X 2)  CULTURE, BLOOD (ROUTINE X 2)  URINE CULTURE  CULTURE, EXPECTORATED SPUTUM-ASSESSMENT  GRAM STAIN  RESPIRATORY VIRUS PANEL  LIPASE, BLOOD  PROCALCITONIN  APTT  URINALYSIS, ROUTINE W REFLEX MICROSCOPIC (NOT AT [facility])  CREATININE, URINE, RANDOM  UREA NITROGEN, URINE  CBC  CBC  BRAIN NATRIURETIC PEPTIDE  LACTIC ACID, PLASMA  STREP PNEUMONIAE URINARY ANTIGEN  LEGIONELLA PNEUMOPHILA SEROGP 1 UR AG  INFLUENZA PANEL BY PCR (TYPE A & B, H1N1)  CBC  CBC  I-STAT TROPOININ, ED  TYPE AND SCREEN  ABO/RH    Imaging Review Dg Chest Portable 1 View  12/31/2015  CLINICAL DATA:  Acute onset of hypoxia.  Initial encounter. EXAM: PORTABLE CHEST 1 VIEW COMPARISON:  Chest radiograph from 10/25/2015 FINDINGS: The lungs are well-aerated. Minimal right basilar atelectasis is noted. There is no evidence of pleural effusion or pneumothorax. The cardiomediastinal silhouette is normal in size. A pacemaker/AICD is noted overlying the left chest wall, with leads ending overlying the right atrium, right ventricle and coronary sinus. No acute osseous abnormalities are seen. IMPRESSION: Minimal right basilar atelectasis noted.  Lungs otherwise clear. Electronically Signed   By: Roanna Raider M.D.   On:  12/31/2015 21:42   I have personally reviewed and evaluated these images and lab results as part of my medical decision-making.   EKG Interpretation None      MDM   70 yo M with PMHx of HTN, HLD, CAD, ICCM  s/p BiV pacemaker placement, bipolar disorder, with h/o UGIB but declined further intervention, DNR/DNI, who p/w hypoxia in setting of several episodes of coffee-ground emesis. On arrival, pt hypoxic, with borderline hypotension but no SBP<100. CXR concerning for right basilar atelectasis, possibly 2/2 aspiration. Remainder as above.  Regarding his coffee-ground emesis, stool sample sent for heme-occult. CBC shows stable Hgb and he has no h/o cirrhosis or varices. No signs of hematemesis, hematochezia, tachycardia, or other sx to suggest brisk UGIB. WIll give PPI. Per review of records, pt has refused GI intervention in past so will hold on GI consult at this time.  Regarding pt's hypoxia, CXR findings - concern for aspiration in setting of recent emesis. Will treat empirically with broad-spectrum coverage. Otherwise, no signs of DVT/PE to suggest alternative etiology for hypoxia. DDx includes profound anemia though pt otherwise HDS at this time. Will check labs.  Labs, imaging reviewed as above. CBC shows WBC 23k with >20% bands. CMP with AKI and mild acidosis. LA elevated at 4.4. Pt has only minimal pain at this time. Given hypoxia, this is possibly 2/2 aspiration PNA and will continue ABX, admit to SDU. However, given degree of lactic acidosis with possible GIB, will obtain CT Angio for further assessment. 1.25L given as bolus with additional 200cc as IVF with Vancomycin, completing 30 cc/kg. Will be cautious with further fluids as pt has h/o EF 20-25%. Discussed with Dr. Clyde Lundborg, admitting Hospitalist, who is aware of CT and will follow-up. Abdomen is otherwise soft at this time and pt denies any abdominal pain. At this time, will continue plan to admit to Hospitalist as per review of records, pt  declines any surgical or interventional intervention. However, would be amenable to medical management of possible intra-abdominal findings as well as discussion of prognosis based on results.  Clinical Impression: 1. Aspiration pneumonia, unspecified aspiration pneumonia type, unspecified laterality, unspecified part of lung (HCC)   2. Abdominal pain, generalized   3. Sepsis, due to unspecified organism (HCC)   4. Hyperkalemia   5. Coffee ground emesis   6. AKI (acute kidney injury) (HCC)   7. DNR (do not resuscitate) discussion     Disposition: Admit  Condition: Fair  Pt seen in conjunction with Dr. Hilarie Fredrickson, MD 01/01/16 [id number]  Richardean Canal, MD 01/03/16 [phone]

## 2015-12-31 NOTE — ED Notes (Signed)
Discussed plan of care with Dr. Erma Heritage, patient is currently being treated for possible sepsis, discussed fluids and antibiotics, MD acknowledges, verbal order for another of normal saline.

## 2016-01-01 ENCOUNTER — Inpatient Hospital Stay (HOSPITAL_COMMUNITY): Payer: Medicare Other

## 2016-01-01 ENCOUNTER — Encounter (HOSPITAL_COMMUNITY): Payer: Self-pay | Admitting: Radiology

## 2016-01-01 DIAGNOSIS — K559 Vascular disorder of intestine, unspecified: Secondary | ICD-10-CM | POA: Diagnosis present

## 2016-01-01 DIAGNOSIS — G35 Multiple sclerosis: Secondary | ICD-10-CM

## 2016-01-01 DIAGNOSIS — K922 Gastrointestinal hemorrhage, unspecified: Secondary | ICD-10-CM | POA: Insufficient documentation

## 2016-01-01 DIAGNOSIS — K920 Hematemesis: Secondary | ICD-10-CM | POA: Insufficient documentation

## 2016-01-01 DIAGNOSIS — Z720 Tobacco use: Secondary | ICD-10-CM

## 2016-01-01 LAB — URINALYSIS, ROUTINE W REFLEX MICROSCOPIC
Bilirubin Urine: NEGATIVE
GLUCOSE, UA: 100 mg/dL — AB
HGB URINE DIPSTICK: NEGATIVE
Ketones, ur: NEGATIVE mg/dL
Leukocytes, UA: NEGATIVE
Nitrite: NEGATIVE
Protein, ur: NEGATIVE mg/dL
SPECIFIC GRAVITY, URINE: 1.015 (ref 1.005–1.030)
pH: 5.5 (ref 5.0–8.0)

## 2016-01-01 LAB — CBC
HCT: 33.8 % — ABNORMAL LOW (ref 39.0–52.0)
HCT: 33 % — ABNORMAL LOW (ref 39.0–52.0)
HCT: 33.9 % — ABNORMAL LOW (ref 39.0–52.0)
HEMATOCRIT: 32 % — AB (ref 39.0–52.0)
HEMOGLOBIN: 10.6 g/dL — AB (ref 13.0–17.0)
HEMOGLOBIN: 9.7 g/dL — AB (ref 13.0–17.0)
Hemoglobin: 10.2 g/dL — ABNORMAL LOW (ref 13.0–17.0)
Hemoglobin: 10.3 g/dL — ABNORMAL LOW (ref 13.0–17.0)
MCH: 29 pg (ref 26.0–34.0)
MCH: 28.1 pg (ref 26.0–34.0)
MCH: 28.2 pg (ref 26.0–34.0)
MCH: 28.1 pg (ref 26.0–34.0)
MCHC: 31.4 g/dL (ref 30.0–36.0)
MCHC: 30.3 g/dL (ref 30.0–36.0)
MCHC: 30.9 g/dL (ref 30.0–36.0)
MCHC: 30.4 g/dL (ref 30.0–36.0)
MCV: 92.3 fL (ref 78.0–100.0)
MCV: 92.8 fL (ref 78.0–100.0)
MCV: 91.2 fL (ref 78.0–100.0)
MCV: 92.6 fL (ref 78.0–100.0)
PLATELETS: 289 10*3/uL (ref 150–400)
Platelets: 299 10*3/uL (ref 150–400)
Platelets: 260 10*3/uL (ref 150–400)
Platelets: 286 10*3/uL (ref 150–400)
RBC: 3.66 MIL/uL — AB (ref 4.22–5.81)
RBC: 3.45 MIL/uL — ABNORMAL LOW (ref 4.22–5.81)
RBC: 3.62 MIL/uL — AB (ref 4.22–5.81)
RBC: 3.66 MIL/uL — AB (ref 4.22–5.81)
RDW: 16.3 % — ABNORMAL HIGH (ref 11.5–15.5)
RDW: 16.2 % — AB (ref 11.5–15.5)
RDW: 16.3 % — ABNORMAL HIGH (ref 11.5–15.5)
RDW: 16.4 % — AB (ref 11.5–15.5)
WBC: 19.8 10*3/uL — AB (ref 4.0–10.5)
WBC: 18.6 10*3/uL — AB (ref 4.0–10.5)
WBC: 21.1 10*3/uL — ABNORMAL HIGH (ref 4.0–10.5)
WBC: 25 10*3/uL — AB (ref 4.0–10.5)

## 2016-01-01 LAB — CBC WITH DIFFERENTIAL/PLATELET
Basophils Absolute: 0 10*3/uL (ref 0.0–0.1)
Basophils Relative: 0 %
EOS ABS: 0 10*3/uL (ref 0.0–0.7)
EOS PCT: 0 %
HCT: 33.2 % — ABNORMAL LOW (ref 39.0–52.0)
Hemoglobin: 10.2 g/dL — ABNORMAL LOW (ref 13.0–17.0)
LYMPHS PCT: 6 %
Lymphs Abs: 1.2 10*3/uL (ref 0.7–4.0)
MCH: 28.2 pg (ref 26.0–34.0)
MCHC: 30.7 g/dL (ref 30.0–36.0)
MCV: 91.7 fL (ref 78.0–100.0)
MONO ABS: 1.6 10*3/uL — AB (ref 0.1–1.0)
Monocytes Relative: 8 %
NEUTROS PCT: 86 %
Neutro Abs: 17.2 10*3/uL — ABNORMAL HIGH (ref 1.7–7.7)
PLATELETS: 298 10*3/uL (ref 150–400)
RBC: 3.62 MIL/uL — AB (ref 4.22–5.81)
RDW: 16.3 % — ABNORMAL HIGH (ref 11.5–15.5)
WBC MORPHOLOGY: INCREASED
WBC: 20 10*3/uL — AB (ref 4.0–10.5)

## 2016-01-01 LAB — MAGNESIUM: MAGNESIUM: 1.7 mg/dL (ref 1.7–2.4)

## 2016-01-01 LAB — COMPREHENSIVE METABOLIC PANEL
ALBUMIN: 1.9 g/dL — AB (ref 3.5–5.0)
ALT: 9 U/L — ABNORMAL LOW (ref 17–63)
ANION GAP: 10 (ref 5–15)
AST: 23 U/L (ref 15–41)
Alkaline Phosphatase: 43 U/L (ref 38–126)
BILIRUBIN TOTAL: 0.4 mg/dL (ref 0.3–1.2)
BUN: 32 mg/dL — ABNORMAL HIGH (ref 6–20)
CO2: 22 mmol/L (ref 22–32)
Calcium: 8.2 mg/dL — ABNORMAL LOW (ref 8.9–10.3)
Chloride: 104 mmol/L (ref 101–111)
Creatinine, Ser: 1 mg/dL (ref 0.61–1.24)
GFR calc Af Amer: >60 mL/min (ref >60)
GFR calc non Af Amer: >60 mL/min (ref >60)
GLUCOSE: 118 mg/dL — AB (ref 65–99)
POTASSIUM: 4 mmol/L (ref 3.5–5.1)
Sodium: 136 mmol/L (ref 135–145)
TOTAL PROTEIN: 7.7 g/dL (ref 6.5–8.1)

## 2016-01-01 LAB — GLUCOSE, CAPILLARY
GLUCOSE-CAPILLARY: 146 mg/dL — AB (ref 65–99)
GLUCOSE-CAPILLARY: 109 mg/dL — AB (ref 65–99)

## 2016-01-01 LAB — INFLUENZA PANEL BY PCR (TYPE A & B)
H1N1FLUPCR: NOT DETECTED
INFLAPCR: NEGATIVE
INFLBPCR: NEGATIVE

## 2016-01-01 LAB — APTT: APTT: 31 s (ref 24–37)

## 2016-01-01 LAB — LIPASE, BLOOD: Lipase: 16 U/L (ref 11–51)

## 2016-01-01 LAB — LACTIC ACID, PLASMA
LACTIC ACID, VENOUS: 3.7 mmol/L — AB (ref 0.5–2.0)
Lactic Acid, Venous: 3.9 mmol/L (ref 0.5–2.0)
Lactic Acid, Venous: 2.8 mmol/L (ref 0.5–2.0)

## 2016-01-01 LAB — PROCALCITONIN: PROCALCITONIN: 0.33 ng/mL

## 2016-01-01 LAB — BRAIN NATRIURETIC PEPTIDE: B Natriuretic Peptide: 316.5 pg/mL — ABNORMAL HIGH (ref 0.0–100.0)

## 2016-01-01 LAB — MRSA PCR SCREENING: MRSA by PCR: POSITIVE — AB

## 2016-01-01 LAB — STREP PNEUMONIAE URINARY ANTIGEN: Strep Pneumo Urinary Antigen: NEGATIVE

## 2016-01-01 LAB — CREATININE, URINE, RANDOM: CREATININE, URINE: 79.98 mg/dL

## 2016-01-01 MED ORDER — MORPHINE SULFATE (PF) 2 MG/ML IV SOLN
2.0000 mg | INTRAVENOUS | Status: DC | PRN
  Administered 2016-01-01 – 2016-01-02 (×2): 2 mg via INTRAVENOUS
  Filled 2016-01-01 (×2): qty 1

## 2016-01-01 MED ORDER — BOOST / RESOURCE BREEZE PO LIQD
1.0000 | Freq: Two times a day (BID) | ORAL | Status: DC
  Administered 2016-01-01: 1 via ORAL

## 2016-01-01 MED ORDER — LEVALBUTEROL HCL 1.25 MG/0.5ML IN NEBU: 1.2500 mg | INHALATION_SOLUTION | Freq: Four times a day (QID) | RESPIRATORY_TRACT | Status: DC | PRN

## 2016-01-01 MED ORDER — CHLORHEXIDINE GLUCONATE CLOTH 2 % EX PADS: 6.0000 | MEDICATED_PAD | Freq: Every day | CUTANEOUS | Status: DC

## 2016-01-01 MED ORDER — RESOURCE THICKENUP CLEAR PO POWD
ORAL | Status: DC | PRN
  Filled 2016-01-01: qty 125

## 2016-01-01 MED ORDER — SODIUM CHLORIDE 0.9 % IV BOLUS (SEPSIS): 250.0000 mL | Freq: Once | INTRAVENOUS | Status: DC

## 2016-01-01 MED ORDER — IOPAMIDOL (ISOVUE-370) INJECTION 76%
INTRAVENOUS | Status: AC
  Administered 2016-01-01: 100 mL
  Filled 2016-01-01: qty 100

## 2016-01-01 MED ORDER — IOPAMIDOL (ISOVUE-370) INJECTION 76%
INTRAVENOUS | Status: AC
  Filled 2016-01-01: qty 100

## 2016-01-01 MED ORDER — MUPIROCIN 2 % EX OINT
1.0000 "application " | TOPICAL_OINTMENT | Freq: Two times a day (BID) | CUTANEOUS | Status: DC
  Administered 2016-01-01 (×2): 1 via NASAL
  Filled 2016-01-01 (×2): qty 22

## 2016-01-01 MED ORDER — CETYLPYRIDINIUM CHLORIDE 0.05 % MT LIQD
7.0000 mL | Freq: Two times a day (BID) | OROMUCOSAL | Status: DC
  Administered 2016-01-01 – 2016-01-02 (×2): 7 mL via OROMUCOSAL

## 2016-01-01 NOTE — ED Notes (Signed)
Reported lactic acid result of 3.9 to Dr. Clyde Lundborg

## 2016-01-01 NOTE — ED Notes (Signed)
Pt refusing to urinate for urine sample.

## 2016-01-01 NOTE — Progress Notes (Signed)
CRITICAL VALUE ALERT  Critical value received:  Blood culture, aerobic  Date of notification:  01/01/16  Time of notification:  19:28  Critical value read back:Yes.    Nurse who received alert:  Annabell Howells, RN  MD notified (1st page):  Lyda Perone, MD  Time of first page:  19:32  MD notified (2nd page):  Time of second page:  Responding MD:  Lyda Perone, MD  Time MD responded:  19:40  Comments: Patient has been receiving IV vancomycin and IV piperacillin-tazobactam. Antibiotic coverage is currently appropriate given results, per MD.  Continue to monitor closely.

## 2016-01-01 NOTE — Evaluation (Signed)
Clinical/Bedside Swallow Evaluation Patient Details  Name: [name] MRN: [id number] Date of Birth: Dec 05, 1945  Today's Date: 01/01/2016 Time: SLP Start Time (ACUTE ONLY): 9702 SLP Stop Time (ACUTE ONLY): 0935 SLP Time Calculation (min) (ACUTE ONLY): 14 min  Past Medical History:  Past Medical History  Diagnosis Date  . Coronary artery disease     s/p PCI of LAD with BMS 04/03/06 at [facility]  . Hyperlipidemia   . Hypertension   . Cerebrovascular accident [facility])     history of cerebrovascular accident with residual left hemiparesis in 2002, seen at [facility]  . Multiple sclerosis (HCC)   . Ischemic cardiomyopathy     s/p BiV ICD implanted 2007 at [facility]  . Chronic combined systolic and diastolic congestive heart failure, NYHA class 3 (HCC)   . Bipolar disorder (HCC)   . Failure to thrive (0-17)    Past Surgical History:  Past Surgical History  Procedure Laterality Date  . Tonsillectomy    . Vasectomy    . Implant of automatic cardioverter defibrillator      December 26, 2004 MDT BiV ICD implanted by Dr Chales Abrahams in [facility]  . [facility]      PCI LAD with BMS 04/03/06  . Ep implantable device N/A 01/18/2015    Procedure: BIV PPM Downgrade;  Surgeon: Hillis Range, MD;  Location: [facility] INVASIVE CV LAB;  Service: Cardiovascular;  Laterality: N/A;   HPI:  70 y.o. 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 Pt has had several swallowing evaluations in the past recommending Dys 3 to regular textures and thin  liquids. Most recent MBS completed in 2011, most recent clinical evaluation in 10/2015.   Assessment / Plan / Recommendation Clinical Impression  Pt has wet vocal quality and drop in SpO2 to 81% following thin liquid trials, with quick recovery following cued cough. No signs concerning for aspiration with nectar thick liquids, even during periods of rapid intake. Pt politely declined any solid POs, reporting that solid intake makes him nauseous. Recommend starting clear liquid diet with nectar thick liquids. Would attempt meds crushed in puree as pt will tolerate. SLP to f/u for advancement as able.    Aspiration Risk  Mild aspiration risk;Moderate aspiration risk    Diet Recommendation Nectar-thick liquid   Liquid Administration via: Straw Medication Administration: Crushed with puree Supervision: Patient able to self feed;Full supervision/cueing for compensatory strategies Compensations: Slow rate;Small sips/bites Postural Changes: Seated upright at 90 degrees;Remain upright for at least 30 minutes after po intake    Other  Recommendations Recommended Consults: Consider GI evaluation Oral Care Recommendations: Oral care BID Other Recommendations: Order thickener from pharmacy;Prohibited food (jello, ice cream, thin soups);Remove water pitcher   Follow up Recommendations  Skilled Nursing facility    Frequency and Duration min 2x/week  2 weeks       Prognosis Prognosis for Safe Diet Advancement: Good      Swallow Study   General HPI: 70 y.o. male with medical history significant of HLD, Bipolar disorder (HCC), multiple sclerosis (HCC), essential hypertension, AD S/P percutaneous coronary angioplasty, chronic combined systolic and diastolic CHF with EF 20 to  25%, tobacco abuse, stroke, biventricular ICD (implantable cardioverter-defibrillator) in place, protein-calorie malnutrition, severe, LV (left ventricular) mural thrombus without MI no on AC, aspiration pneumonia, sepsis,  hematemesis, who presents with Hematemesis, SOB and abdominal pain. 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 Most recent MBS completed in 2011, most recent clinical evaluation in 10/2015. Type of Study: Bedside Swallow Evaluation Previous Swallow Assessment: see HPI Diet Prior to this Study: NPO Temperature Spikes Noted: No Respiratory Status: Nasal cannula History of Recent Intubation: No Behavior/Cognition: Alert;Cooperative;Other (Comment) (dysarthric, difficult to understand) Oral Cavity Assessment: Dry Oral Care Completed by SLP: No Oral Cavity - Dentition: Poor condition;Missing dentition Vision: Functional for self-feeding Self-Feeding Abilities: Able to feed self;Needs set up Patient Positioning: Upright in bed Baseline Vocal Quality: Normal;Other (comment) (dysarthric speech) Volitional Cough: Strong Volitional Swallow: Unable to elicit    Oral/Motor/Sensory Function Overall Oral Motor/Sensory Function: Other (comment) (baseline unilateral weakness)   Ice Chips Ice chips: Impaired Presentation: Spoon Pharyngeal Phase Impairments: Suspected delayed Swallow   Thin Liquid Thin Liquid: Impaired Presentation: Cup;Self Fed;Straw Pharyngeal  Phase Impairments: Suspected delayed Swallow;Wet Vocal Quality;Change in Vital Signs (drop in Sp)2 to 81%)    Nectar Thick Nectar Thick Liquid: Impaired Presentation: Self Fed;Straw Pharyngeal Phase Impairments: Suspected delayed Swallow   Honey Thick Honey Thick Liquid: Not tested   Puree Puree: Not tested (pt declined, reports nausea)   Solid   GO   Solid: Not tested (pt declined, reports nausea)       [name], M.A. CCC-SLP [phone]  [name] 01/01/2016,9:47 AM

## 2016-01-01 NOTE — Progress Notes (Signed)
CRITICAL VALUE ALERT  Critical value received:  Lactic Acid 2.8, MRSA Positive in Nares  Date of notification:  01/01/16  Time of notification:  1238 and 1139  Critical value read back: Yes   Nurse who received alert:  Hazel Sams RN   MD notified (1st page):  MD Joseph Art  Time of first page:  1250  MD notified (2nd page):  Time of second page:  Responding MD:  MD Joseph Art   Time MD responded:  1253   Notified MD Joseph Art of Lactic trending down, MRSA positive in Nares placed standing orders. No new orders at this time. Will monitor and assess.

## 2016-01-01 NOTE — ED Notes (Signed)
Respiratory at bed bedside.

## 2016-01-01 NOTE — ED Notes (Signed)
Discussed plan of care with Dr. Youlanda Roys and Dr. Shyrl Numbers.

## 2016-01-01 NOTE — Progress Notes (Signed)
At approximately 21:45, patient's O2 saturations dropped to 80-85% range on O2 at 6L/min. Placed Venturi mask with O2 delivery at 35% with no improvement at 2 minutes. Attempted TCDB; patient not compliant. Repositioned in bed. Increased O2 delivery to 55% by Venturi mask with return of O2 saturation to baseline (93-95%).  BP remains soft, currently 81/62 MAP >60 (currently 69) and patient is arousable, oriented to self, place and situation. Pain is controlled after morphine 2 mg IV was given at 20:40.  Receiving continuous IV fluids at 50 ml/hr. Last known EF 20%; patient has ICD.  Ventricular pacing at this time.  Continuing to monitor closely.

## 2016-01-01 NOTE — Progress Notes (Signed)
Patient is from [facility]. CSW will continue to follow for disposition.      Lance Muss, LCSW [facility] ED/23M Clinical Social Worker [phone]

## 2016-01-01 NOTE — ED Notes (Signed)
Patient going to ct.

## 2016-01-01 NOTE — Progress Notes (Signed)
Initial Nutrition Assessment  DOCUMENTATION CODES:   Severe malnutrition in context of chronic illness  INTERVENTION:    Boost Breeze PO BID, thickened to appropriate consistency, each supplement provides 250 kcal and 9 grams of protein  NUTRITION DIAGNOSIS:   Malnutrition related to chronic illness as evidenced by severe depletion of body fat, severe depletion of muscle mass.  GOAL:   Patient will meet greater than or equal to 90% of their needs  MONITOR:   PO intake, Supplement acceptance, Diet advancement, Weight trends, Skin, I & O's  REASON FOR ASSESSMENT:   Consult Assessment of nutrition requirement/status  ASSESSMENT:   70 y.o. 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   Labs reviewed: potassium elevated  Nutrition-Focused physical exam completed. Findings are severe fat depletion, severe muscle depletion, and no edema. Patient's friend and former caregiver in room with patient. She reports that he has been progressively losing weight due to multiple sclerosis. He has lost 15% of his weight in the past 2 months, per review of weights in medical record. Patient with severe PCM. He reports poor appetite and poor intake. Currently on a clear liquid, nectar thick diet. He is requesting apple juice, RN to thicken once thickener arrives on the unit. Palliative Care consult has been ordered.   Diet Order:  Diet clear liquid Room service appropriate?: Yes; Fluid consistency:: Nectar Thick  Skin:  Reviewed, no  issues  Last BM:  unknown  Height:   Ht Readings from Last 1 Encounters:  12/31/15 5\' 5"  (1.651 m)    Weight:   Wt Readings from Last 1 Encounters:  12/31/15 100 lb (45.36 kg)    Ideal Body Weight:  61.8 kg  BMI:  Body mass index is 16.64 kg/(m^2).  Estimated Nutritional Needs:   Kcal:  >/= 1600 kcals  Protein:  80 gm  Fluid:  >/= 1.6 L  EDUCATION NEEDS:   No education needs identified at this time  Joaquin Courts, RD, LDN, CNSC Pager [phone] After Hours Pager [phone]

## 2016-01-01 NOTE — ED Notes (Signed)
Discussed with patient attempt to contact son and caregiver with no success. Patient declines to decide need for surgery until he speaks with Sharia Reeve, caregiver. Phlebotomy at the bedside.

## 2016-01-01 NOTE — Progress Notes (Signed)
Patient not taking POs well. Wet lung sounds bilaterally, strong suspicion for aspiration per CT of chest done last night. CT also showed ischemic bowel; patient currently complains of 7/10 abdominal pain. He is moaning and doubled-over in pain. Positive blood culture result; currently receiving IV vancomycin and zosyn per pharmacy consults.  Spoke with Dr. Julian Reil by telephone. Orders received.  Per patient and son on admission, desire is for no invasive management. Palliative care to consult in AM. Will manage pain and watch BP closely.

## 2016-01-01 NOTE — ED Notes (Signed)
Called [facility] and spoke to Vernona Rieger, nurse to obtain contact information: Son: Renne Musca, lives out of town: [phone], phone disconnected. Caregiver is Annetta Maw [phone]. No answer, left a message to return call to .

## 2016-01-01 NOTE — Progress Notes (Signed)
[place] TEAM 1 - Stepdown/ICU TEAM Progress Note  [name] VZC:[id number] DOB: 02-Jun-1946 DOA: 12/31/2015 PCP: Kirt Boys, DO  Admit HPI / Brief Narrative: 70 y.o. 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   Patient was recently hospitalized from 2/13-2/20 because of hematemesis, UTI and aspiration pneumonia. Patient refused further GI workup. He was transfused with 2 units of blood, hemoglobin back to 9.1 on 11/10/15. Pt reports that he had 4 episode of hematemesis with coffee ground materials in the past 2 days. He has generalized weakness. He has mild diffuse abdominal pain. He reports that he has diarrhea, but could not give detailed information. He is on several laxatives including MiraLAX, magnesium hydroxide and Dulcolax. Pt has cough, SOB, but no CP. He denies symptoms of UTI. No fever or chills. He moves all extremities.  ED Course: pt was found to have oxygen desaturation to 86% on room air, INR1.21, WBC 23, temperature normal, tachycardia, tachypnea, potassium of 5.6, negative troponin, worsening renal function. Hemoglobin 9.1 on 11/10/15-->12.4 today. Lactate is 4.38-->3.9. Chest x-ray showed right basilar atelectasis. Patient is admitted to inpatient for further events and treatment.  # CT angiogram showed Acute appearing embolic/thrombotic occlusion of the distal SMA and jejunal branches. Associated small bowel ischemic wall thickening. Right inguinal hernia containing thickened small bowel. Bibasilar pneumonia or  aspiration. Congenital non rotation of bowel.   HPI/Subjective: 4/25 A/O x 4    Assessment/Plan: Acute hypoxic respiratory failure/Sepsis Unspecified Organism:  -This likely due to possible aspiration pneumonia, but can not r/u HCAP. He meets criteria for sepsis, with leukocytosis, tachycardia, tachypnea. Lactate is 4.38-->3.9. Currently hemodynamically stable. - IV Vancomycin and Zosyn - Mucinex for cough  - Xopenex Neb prn for SOB - Urine legionella and S. pneumococcal antigen - Follow up blood culture x2, sputum culture and respiratory virus panel, plus Flu pcr - will get Procalcitonin and trend lactic acid level per sepsis protocol - IVF: 500 x 2 of NS bolus in ED, followed by 50 mL per hour of NS (patient has EF 20-25%, limiting aggressive IV fluids treatment) - SLP   Acute GI Bleed (Coffee-ground emesis) - had this issue in previous admission. GI consulted. patient he did not wish for any further workup regarding GI bleed. His AC is on held. Patient was transfused 2 units PRBC 2/18. Hemoglobin 9.1 on 11/10/15-->12.4 today -IV protonix bid -cbc q6h  Ischemic bowel disease:  -CT angiogram showed embolic/thrombotic occlusion of the distal SMA and jejunal branches, associated small bowel ischemic wall thickening. -Pt not interested in any invasive procedure. Discussed at length with Pt and his friend [name].  -Counseled Pt if we restart anty type anticoagulation would most likely bleed. Transfusion would not be feasible. If we go with no anticoagulation then most likely embolic/thrombotic occlusion would enlarge resulting in Small Bowel death. -His friend, [name] [phone]) has been counciled as to medical issues per Pt wish. -Pt not a surgical candidate.  - Nortmal Saline 58ml/hr  Chronic combined systolic and diastolic CHF/ischemic cardiomyopathy/CAD/ICD: 2-D echo 2014 showed LVEF 20-25% with diffuse hypokinesis. No leg edema or JVD on admisison. CHF is  compensated -Hold Lasix  and spironolactone due to sepsis -Continue Coreg 25 mg BID -Strict in and Out - Daily weight Filed Weights   12/31/15 2125  Weight: 45.36 kg (100 lb)   LV thrombus:  -AC is on Hold 2dary GIB.  -See Ischemic Bowel    Essential hypertension: -BP 101/79 -observe bp closely -hold diuretic  Hyperlipidemia:  - Lipitor 40 mg Daily .  CVA:  - Plavix on hold  -continue lipitor  Bipolar disorder:  - Continue fluoxetine and valproate.   Protein calorie malnutrition and dult failure to thrive:  -consult to nutrition  Diarrhea:  -on laxatives. Will hold all laxatives   Goals of care: -Awaiting Pallitive Care input. Pt will most likely not survive long post hospitalization. Short term vs Long term goals of care? Hospice   Code Status: FULL Family Communication: [name] [phone]) present at time of exam; [name] ([phone]) per admission note has been notified. Disposition Plan: Per Pallitive Care    Consultants: NA  Procedure/Significant Events:    Culture NA  Antibiotics: NA  DVT prophylaxis: SCD   Devices    LINES / TUBES:      Continuous Infusions: . sodium chloride 50 mL/hr at 01/01/16 0932    Objective: VITAL SIGNS: Temp: 98.8 F (37.1 C) (04/25 1517) Temp Source: Oral (04/25 1517) BP: 90/76 mmHg (04/25 1500) Pulse Rate: 90 (04/25 1515) SPO2; FIO2:   Intake/Output Summary (Last 24 hours) at 01/01/16 1536 Last data filed at 01/01/16 1500  Gross per 24 hour  Intake 1648.33 ml  Output    150 ml  Net 1498.33 ml     Exam: General: Cachetic A/O x 4, No acute respiratory distress Eyes: negative scleral hemorrhage, negative icterus ENT: Negative Runny nose, negative gingival bleeding, Neck:  Negative scars, masses, torticollis, lymphadenopathy, JVD Lungs: Clear to auscultation bilaterally without wheezes or crackles Cardiovascular: Regular rate and rhythm without murmur gallop or rub  normal S1 and S2 Abdomen:negative abdominal pain, nondistended, positive soft, bowel sounds, no rebound, no ascites, no appreciable mass Extremities: No significant cyanosis, clubbing, or edema bilateral lower extremities Psychiatric:  Negative depression, negative anxiety, negative fatigue, negative mania  Neurologic:  Cranial nerves II through XII intact, tongue/uvula midline, all extremities muscle strength 5/5, sensation intact throughout, negative dysarthria, negative expressive aphasia, negative receptive aphasia.   Data Reviewed: Basic Metabolic Panel:  Recent Labs Lab 12/31/15 2136 12/31/15 2145 01/01/16 1115  NA 137 138 136  K 5.6* 5.5* 4.0  CL 103 103 104  CO2 21*  --  22  GLUCOSE 146* 149* 118*  BUN 38* 49* 32*  CREATININE 1.26* 1.20 1.00  CALCIUM 8.7*  --  8.2*  MG  --   --  1.7   Liver Function Tests:  Recent Labs Lab 12/31/15 2136 01/01/16 1115  AST 36 23  ALT 12* 9*  ALKPHOS 57 43  BILITOT 0.7 0.4  PROT 9.0* 7.7  ALBUMIN 2.3* 1.9*    Recent Labs Lab 01/01/16 0113  LIPASE 16   No results for input(s): AMMONIA in the last 168 hours. CBC:  Recent Labs Lab 12/31/15 2136 12/31/15 2145 01/01/16 0204 01/01/16 0756 01/01/16 1115 01/01/16 1340  WBC 23.0*  --  19.8* 18.6* 20.0* 21.1*  NEUTROABS 20.0*  --   --   --  17.2*  --   HGB 12.4* 15.3 10.6* 9.7* 10.2* 10.2*  HCT 40.4 45.0 33.8* 32.0* 33.2* 33.0*  MCV 92.4  --  92.3 92.8 91.7 91.2  PLT 333  --  299 260 298 289   Cardiac Enzymes: No results for input(s): CKTOTAL, CKMB, CKMBINDEX, TROPONINI in the last 168 hours. BNP (last 3 results)  Recent Labs  10/22/15 1622 01/01/16 0205  BNP 225.9* 316.5*    ProBNP (last 3 results) No results for input(s): PROBNP in the last 8760 hours.  CBG:  Recent Labs Lab 12/31/15 2132 01/01/16 0844  GLUCAP 146* 109*    Recent Results (from the past 240 hour(s))  Blood culture (routine x 2)     Status: None (Preliminary result)   Collection Time:  12/31/15 10:34 PM  Result Value Ref Range Status   Specimen Description BLOOD RIGHT ANTECUBITAL  Final   Special Requests BOTTLES DRAWN AEROBIC AND ANAEROBIC 10CC  Final   Culture NO GROWTH < 12 HOURS  Final   Report Status PENDING  Incomplete  Blood culture (routine x 2)     Status: None (Preliminary result)   Collection Time: 12/31/15 10:34 PM  Result Value Ref Range Status   Specimen Description BLOOD RIGHT FOREARM  Final   Special Requests BOTTLES DRAWN AEROBIC AND ANAEROBIC 10CC  Final   Culture NO GROWTH < 12 HOURS  Final   Report Status PENDING  Incomplete  MRSA PCR Screening     Status: Abnormal   Collection Time: 01/01/16  9:32 AM  Result Value Ref Range Status   MRSA by PCR POSITIVE (A) NEGATIVE Final    Comment:        The GeneXpert MRSA Assay (FDA approved for NASAL specimens only), is one component of a comprehensive MRSA colonization surveillance program. It is not intended to diagnose MRSA infection nor to guide or monitor treatment for MRSA infections. RESULT CALLED TO, READ BACK BY AND VERIFIED WITH: J SMITH,RN AT 1139 01/01/16 BY L BENFIELD      Studies:  Recent x-ray studies have been reviewed in detail by the Attending Physician  Scheduled Meds:  Scheduled Meds: . antiseptic oral rinse  7 mL Mouth Rinse BID  . atorvastatin  40 mg Oral Daily  . carvedilol  25 mg Oral BID WC  . [START ON 01/02/2016] Chlorhexidine Gluconate Cloth  6 each Topical Q0600  . dextromethorphan-guaiFENesin  1 tablet Oral BID  . erythromycin  1 application Right Eye QID  . famotidine  20 mg Oral Daily  . feeding supplement  1 Container Oral BID BM  . FLUoxetine  20 mg Oral Daily  . levalbuterol  1.25 mg Nebulization Q6H  . mineral oil-hydrophilic petrolatum  1 application Topical QHS  . mupirocin ointment  1 application Nasal BID  . pantoprazole (PROTONIX) IV  40 mg Intravenous Q12H  . piperacillin-tazobactam (ZOSYN)  IV  3.375 g Intravenous Q8H  . tamsulosin  0.4 mg Oral  QPC supper  . valproic acid  250 mg Oral BID  . vancomycin  750 mg Intravenous Q24H    Time spent on care of this patient: 40 mins   WOODS, Roselind Messier , MD  [facility] Office  [phone] Pager - [phone]  On-Call/Text Page:      [email or web address]      password [email or web address]  If 7PM-7AM, please contact night-coverage [email or web address] Password [email or web address] 01/01/2016, 3:36 PM   LOS: 1 day   Care during the described time interval was provided by me .  I have reviewed this patient's available data, including medical history, events of note, physical examination, and all test results as part of my evaluation. I have personally reviewed and interpreted all radiology studies.  Curtis Woods, MD [phone] Pager    

## 2016-01-02 DIAGNOSIS — R0600 Dyspnea, unspecified: Secondary | ICD-10-CM | POA: Insufficient documentation

## 2016-01-02 DIAGNOSIS — N179 Acute kidney failure, unspecified: Secondary | ICD-10-CM

## 2016-01-02 DIAGNOSIS — J9601 Acute respiratory failure with hypoxia: Secondary | ICD-10-CM

## 2016-01-02 DIAGNOSIS — R1310 Dysphagia, unspecified: Secondary | ICD-10-CM | POA: Insufficient documentation

## 2016-01-02 DIAGNOSIS — K922 Gastrointestinal hemorrhage, unspecified: Secondary | ICD-10-CM

## 2016-01-02 DIAGNOSIS — Z66 Do not resuscitate: Secondary | ICD-10-CM

## 2016-01-02 DIAGNOSIS — Z515 Encounter for palliative care: Secondary | ICD-10-CM | POA: Insufficient documentation

## 2016-01-02 LAB — RESPIRATORY VIRUS PANEL
Adenovirus: NEGATIVE
Influenza A: NEGATIVE
Influenza B: NEGATIVE
Metapneumovirus: NEGATIVE
Parainfluenza 1: NEGATIVE
Parainfluenza 2: NEGATIVE
Parainfluenza 3: NEGATIVE
Respiratory Syncytial Virus A: NEGATIVE
Respiratory Syncytial Virus B: NEGATIVE
Rhinovirus: NEGATIVE

## 2016-01-02 LAB — CBC
HCT: 31.6 % — ABNORMAL LOW (ref 39.0–52.0)
HEMATOCRIT: 31 % — AB (ref 39.0–52.0)
HEMOGLOBIN: 9.7 g/dL — AB (ref 13.0–17.0)
Hemoglobin: 9.6 g/dL — ABNORMAL LOW (ref 13.0–17.0)
MCH: 27.7 pg (ref 26.0–34.0)
MCH: 28.8 pg (ref 26.0–34.0)
MCHC: 30.4 g/dL (ref 30.0–36.0)
MCHC: 31.3 g/dL (ref 30.0–36.0)
MCV: 91.1 fL (ref 78.0–100.0)
MCV: 92 fL (ref 78.0–100.0)
PLATELETS: 262 10*3/uL (ref 150–400)
Platelets: 249 10*3/uL (ref 150–400)
RBC: 3.47 MIL/uL — AB (ref 4.22–5.81)
RBC: 3.37 MIL/uL — ABNORMAL LOW (ref 4.22–5.81)
RDW: 16.3 % — AB (ref 11.5–15.5)
RDW: 16.3 % — AB (ref 11.5–15.5)
WBC: 24.4 10*3/uL — AB (ref 4.0–10.5)
WBC: 23 10*3/uL — AB (ref 4.0–10.5)

## 2016-01-02 LAB — URINE CULTURE: CULTURE: NO GROWTH

## 2016-01-02 LAB — LEGIONELLA PNEUMOPHILA SEROGP 1 UR AG: L. pneumophila Serogp 1 Ur Ag: NEGATIVE

## 2016-01-02 LAB — UREA NITROGEN, URINE: Urea Nitrogen, Ur: 1235 mg/dL

## 2016-01-02 MED ORDER — MORPHINE SULFATE (PF) 2 MG/ML IV SOLN
1.0000 mg | INTRAVENOUS | Status: DC | PRN
Start: 2016-01-02 — End: 2016-01-03
  Administered 2016-01-03: 1 mg via INTRAVENOUS
  Filled 2016-01-02: qty 1

## 2016-01-02 NOTE — Progress Notes (Signed)
Per procedural note in may 2016, pt's ICD lead removed. Emelda Brothers RN

## 2016-01-02 NOTE — Progress Notes (Addendum)
Update: Patient able to discharge tomorrow 4/27 to [facility].   CSW sent residential hospice referral. Pending bed availability.  Osborne Casco Bond Grieshop LCSWA [phone]

## 2016-01-02 NOTE — Progress Notes (Signed)
   01/02/16 1455  Clinical Encounter Type  Visited With Patient  Visit Type Spiritual support  Referral From Palliative care team  Spiritual Encounters  Spiritual Needs Prayer  Stress Factors  Patient Stress Factors Exhausted;Health changes;Loss  Chaplain got brief update from [facility] prior to visit. Son had expressed to her the belief that the patient would appreciate prayer. I stayed patient for a few moments, offering prayer and holding his hand, before leaving him to have nurse provide water. Patient was extremely tired (per PC) and mostly sleeping. Kitrina Maurin, Chaplain

## 2016-01-02 NOTE — Care Management Note (Signed)
Case Management Note  Patient Details  Name: LOTUS MCCLENAGHAN MRN: [id number] Date of Birth: Jun 03, 1946  Subjective/Objective:   Pt admitted for Abdominal Pain and Acute Respiratory Failure. Pt currently resides at [facility]. Palliative Care Consulted- Palliative meeting to establish goals of care. Family Agreeable to Residential Hospice Facility.                  Action/Plan: CM did reach out to CSW in regards to Residential Facility. No further needs from CM at this time.    Expected Discharge Date:                  Expected Discharge Plan:  Hospice Medical Facility  In-House Referral:  Clinical Social Work  Discharge planning Services  CM Consult  Post Acute Care Choice:  NA Choice offered to:  NA  DME Arranged:  N/A DME Agency:  NA  HH Arranged:  NA HH Agency:  NA  Status of Service:  Completed, signed off  Medicare Important Message Given:    Date Medicare IM Given:    Medicare IM give by:    Date Additional Medicare IM Given:    Additional Medicare Important Message give by:     If discussed at Long Length of Stay Meetings, dates discussed:    Additional Comments:  Gala Lewandowsky, RN 01/02/2016, 11:30 AM

## 2016-01-02 NOTE — Progress Notes (Signed)
Speech Language Pathology Treatment: Dysphagia  Patient Details Name: [name] MRN: [id number] DOB: 1945/11/02 Today's Date: 01/02/2016 Time: 8889-1694 SLP Time Calculation (min) (ACUTE ONLY): 14 min  Assessment / Plan / Recommendation Clinical Impression  Events from overnight with desaturations noted from chart. Upon SLP arrival, pt is alert and requesting something to drink. SLP repositioned pt into a more upright position and provided few spoonfuls with small amounts (>1/2 tsp) of nectar thick liquids. VS remained stable and no overt coughing was noted. SLP prompted pt to cough as a precaution. Given current respiratory status pt remains at a high risk for aspiration. Note that palliative care consult is pending. Discussed with RN to hold POs pending palliative care consult while respiratory status remains compromised. Will f/u as indicated following GOC discussion.   HPI HPI: 70 y.o. 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 Most recent MBS completed in 2011, most recent clinical evaluation in 10/2015.      SLP Plan  Continue with current plan of care     Recommendations  Diet recommendations: Nectar-thick liquid Liquids provided via: Teaspoon;Cup Medication Administration: Crushed with puree Supervision: Full supervision/cueing for compensatory strategies;Staff to assist with self feeding Compensations: Slow  rate;Small sips/bites Postural Changes and/or Swallow Maneuvers: Seated upright 90 degrees;Upright 30-60 min after meal             Oral Care Recommendations: Oral care QID Follow up Recommendations: Skilled Nursing facility Plan: Continue with current plan of care     GO               [name], M.A. CCC-SLP [phone]  [name] 01/02/2016, 10:45 AM

## 2016-01-02 NOTE — Consult Note (Signed)
Consultation Note Date: 01/02/2016   Patient Name: [name]  DOB: 11-Oct-1945  MRN: [id number]  Age / Sex: 70 y.o., male  PCP: Kirt Boys, DO Referring Physician: Dorothea Ogle, MD  Reason for Consultation: Establishing goals of care, Non pain symptom management, Pain control and Psychosocial/spiritual support  HPI/Patient Profile: 70 y.o. 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   Patient was recently hospitalized from 2/13-2/20 because of hematemesis, UTI and aspiration pneumonia. Patient refused further GI workup. He was transfused with 2 units of blood, hemoglobin back to 9.1 on 11/10/15. Pt reports that he had 4 episode of hematemesis with coffee ground materials in the past 2 days.   ED Course: pt was found to have oxygen desaturation to 86% on room air, INR1.21, WBC 23, temperature normal, tachycardia, tachypnea, potassium of 5.6, negative troponin, worsening renal function. Hemoglobin 9.1 on 11/10/15-->12.4 today. Lactate is 4.38-->3.9. Chest x-ray showed right basilar atelectasis. Patient is admitted to inpatient for further events and treatment.  # CT angiogram showed Acute appearing embolic/thrombotic occlusion of the distal SMA and jejunal branches. Associated small bowel ischemic wall thickening. Right inguinal hernia containing thickened small bowel. Bibasilar pneumonia or aspiration. Congenital non rotation of bowel.  He has  had continued physical, functional decline over the past several months.  Per his son,  patient and family have "been trying to gett everyone at the facility to only focus on comfort but it is not happening"   He tells me his father does not want further workup or life prolong intervetnions.  The patient himself is albe to tell me today that he want only to "be comfortable" and I "want some ginger ale"   Clinical Assessment and Goals of Care:    This NP Lorinda Creed reviewed medical records, received report from team, assessed the patient and spoke to  the patient at bedside, and by phone to his friend Judi Saa and son Vilinda Boehringer  to discuss diagnosis, prognosis, GOC, EOL wishes disposition and options.   A detailed discussion was had today regarding advanced directives.  Concepts specific to code status, artifical feeding and hydration, continued IV antibiotics and rehospitalization was had.  The difference between a aggressive medical intervention path  and a palliative comfort care path for this patient at this time was had.  Values and goals of care important to patient and family were attempted to be elicited.  Concept of Hospice and Palliative Care were discussed  Natural trajectory and expectations at EOL were discussed.  Questions and concerns addressed.  Family encouraged to call with questions or concerns.  PMT will continue to support holistically.    HCPOA: none documented,  son and daughter in [place] acting as main Scientist, research (medical), per Ms Cathren Harsh, " I don't have any say so, his children make all the decisions now"  SUMMARY OF RECOMMENDATIONS     Code Status/Advance Care Planning:  DNR/DNI    Code Status Orders        Start     Ordered   12/31/15 2340  Do not attempt resuscitation (DNR)   Continuous    Question Answer Comment  In the event of cardiac or respiratory ARREST Do not call a "code blue"   In the event of cardiac or respiratory ARREST Do not perform  Intubation, CPR, defibrillation or ACLS   In the event of cardiac or respiratory ARREST Use medication by any route, position, wound care, and other measures to relive pain and suffering. May use oxygen, suction and manual treatment of airway obstruction as needed for comfort.      12/31/15 2341    Code Status History    Date Active Date Inactive Code Status Order ID Comments User Context   10/26/2015  4:05 PM 10/29/2015  5:05 PM DNR [id number]  Starleen Arms, MD Inpatient   10/23/2015  1:47 AM 10/26/2015  4:05 PM Full Code [id number]  Briscoe Deutscher, MD ED   01/18/2015  4:46 PM 01/18/2015  9:10 PM Full Code [id number]  Hillis Range, MD Inpatient   05/21/2014  9:01 PM 05/23/2014  7:20 PM Full Code [id number]  Quentin Angst, MD Inpatient   08/08/2013  8:15 AM 08/12/2013  5:51 PM Partial Code [id number]  Jacques Navy, MD Inpatient   08/05/2013  3:43 PM 08/05/2013  3:43 PM Full Code [id number]  Jeanella Craze, NP ED   08/05/2013  2:14 PM 08/05/2013  3:43 PM Full Code [id number]  Christiane Ha, MD ED        Symptom Management:   Dyspnea/Pain: Morphine  IV 1 mg every 2 hrs prn  Anxiety: Ativan 1 mg IV every 4 hrs prn  Dysphagia: Comfort feeds as tolerated, with known risk of aspiration.  Palliative Prophylaxis:   Aspiration, Bowel Regimen, Frequent Pain Assessment, Oral Care and Turn Reposition  Additional Recommendations (Limitations, Scope, Preferences):   Full Comfort Care   Dc ICD  Comfort feeds with known risk of aspiration  No further diagnostics, minimize medications  Hopeful  for hospice faciility  Psycho-social/Spiritual:   Desire for further Chaplaincy support:yes  Additional Recommendations: Education on Hospice and Grief/Bereavement Support   It is especially hard for Ms Manson Passey who has been his friend for 17 years, "I will I'd married him years ago, I don't know what I will do without him" Son and daughter live in [place], but have been to visit in the past  months  Prognosis:   < 2 weeks  Discharge Planning:  Son request in place of SNF  Hospice facility       Primary Diagnoses: Present on Admission:  . Essential hypertension . Chronic combined systolic and diastolic CHF, NYHA class 3 (HCC) . Protein-calorie malnutrition, severe (HCC) . HLD (hyperlipidemia) . Bipolar disorder (HCC) . Multiple sclerosis (HCC) . Tobacco abuse . Biventricular ICD (implantable cardioverter-defibrillator) in place . Acute respiratory failure (HCC) . Aspiration pneumonia (HCC) . Sepsis (HCC) . Hyperkalemia . Hematemesis . LV (left ventricular) mural thrombus without MI (HCC) . Abdominal pain . Ischemic bowel disease (HCC)  I have reviewed the medical record, interviewed the patient and family, and examined the patient. The following aspects are pertinent.  Past  Medical History  Diagnosis Date  . Coronary artery disease     s/p PCI of LAD with BMS [facility]/03/06 [facility]  . Hyperlipidemia   . Hypertension   . Cerebrovascular accident [facility]     history of cerebrovascular accident with residual left hemiparesis in 2002, seen at [facility]  . Multiple sclerosis (HCC)   . Ischemic cardiomyopathy     s/p BiV ICD implanted 2007 at [facility]  . Chronic combined systolic and diastolic congestive heart failure, NYHA class 3 (HCC)   . Bipolar disorder (HCC)   . Failure to thrive (0-17)    Social History   Social History  . Marital Status: Divorced    Spouse Name: N/A  . Number of Children: N/A  . Years of Education: N/A   Social History Main Topics  . Smoking status: Former Smoker -- 0.50 packs/day for 40 years    Types: Cigarettes    Quit date: 10/13/2011  . Smokeless tobacco: Never Used     Comment: has tried to quit "many times" will continue to try to quit  . Alcohol Use: Yes     Comment: remote heavy ETOH, "years ago"  . Drug Use: No  . Sexual Activity: Not Asked   Other Topics Concern  . None   Social  History Narrative   HSG. [employer] - 09 months. Married '70's - 2yrs/divorce. Married '80's - 10 yrs/divorced. 1 son - '71, 1 drtr '73. Has grandchildren. Lives alone. On disability.  Lives in [place].   Family History  Problem Relation Age of Onset  . Heart disease Mother   . Kidney disease Father   . COPD Sister   . Cancer Neg Hx   . Diabetes Neg Hx    Scheduled Meds: . antiseptic oral rinse  7 mL Mouth Rinse BID  . atorvastatin  40 mg Oral Daily  . carvedilol  25 mg Oral BID WC  . Chlorhexidine Gluconate Cloth  6 each Topical Q0600  . dextromethorphan-guaiFENesin  1 tablet Oral BID  . erythromycin  1 application Right Eye QID  . famotidine  20 mg Oral Daily  . feeding supplement  1 Container Oral BID BM  . FLUoxetine  20 mg Oral Daily  . mineral oil-hydrophilic petrolatum  1 application Topical QHS  . mupirocin ointment  1 application Nasal BID  . pantoprazole (PROTONIX) IV  40 mg Intravenous Q12H  . piperacillin-tazobactam (ZOSYN)  IV  3.375 g Intravenous Q8H  . tamsulosin  0.4 mg Oral QPC supper  . valproic acid  250 mg Oral BID  . vancomycin  750 mg Intravenous Q24H   Continuous Infusions: . sodium chloride 50 mL/hr at 01/01/16 1725   PRN Meds:.acetaminophen, hydrOXYzine, levalbuterol, morphine injection, RESOURCE THICKENUP CLEAR Medications Prior to Admission:  Prior to Admission medications   Medication Sig Start Date End Date Taking? Authorizing Provider  acetaminophen (TYLENOL) 325 MG tablet Take 2 tablets (650 mg total) by mouth every 4 (four) hours as needed for mild pain, moderate pain, fever or headache. 10/29/15  Yes Starleen Arms, MD  atorvastatin (LIPITOR) 40 MG tablet TAKE ONE TABLET BY MOUTH ONCE DAILY 08/17/15  Yes Veryl Speak, FNP  bisacodyl (DULCOLAX) 10 MG suppository Place 10 mg rectally as needed for moderate constipation.   Yes Historical Provider, MD  carvedilol (COREG) 25 MG tablet TAKE ONE TABLET BY MOUTH TWICE DAILY WITH MEALS 05/10/15  Yes  Veryl Speak, FNP  erythromycin ophthalmic ointment Place  1 application into the right eye 4 (four) times daily. 08/07/14  Yes Veryl Speak, FNP  famotidine (PEPCID) 20 MG tablet TAKE ONE TABLET BY MOUTH ONCE DAILY 08/06/15  Yes Veryl Speak, FNP  FLUoxetine (PROZAC) 20 MG capsule TAKE ONE CAPSULE BY MOUTH ONCE DAILY 08/06/15  Yes Veryl Speak, FNP  furosemide (LASIX) 20 MG tablet TAKE ONE TABLET BY MOUTH ONCE DAILY 08/17/15  Yes Veryl Speak, FNP  magnesium hydroxide (MILK OF MAGNESIA) 400 MG/5ML suspension Take 30 mLs by mouth daily as needed for mild constipation.   Yes Historical Provider, MD  mineral oil-hydrophilic petrolatum (AQUAPHOR) ointment Apply 1 application topically at bedtime.   Yes Historical Provider, MD  ondansetron (ZOFRAN) 4 MG tablet Take 4 mg by mouth every 6 (six) hours as needed for vomiting.   Yes Historical Provider, MD  pantoprazole (PROTONIX) 40 MG tablet Take 1 tablet (40 mg total) by mouth daily. 10/29/15  Yes Starleen Arms, MD  polyethylene glycol (MIRALAX / GLYCOLAX) packet Take 17 g by mouth daily as needed for moderate constipation. 10/29/15  Yes Starleen Arms, MD  saccharomyces boulardii (FLORASTOR) 250 MG capsule Take 1 capsule (250 mg total) by mouth 2 (two) times daily. 10/29/15  Yes Starleen Arms, MD  Sodium Phosphates (RA SALINE ENEMA) 19-7 GM/118ML ENEM Place 1 each rectally as needed (for constipation).   Yes Historical Provider, MD  spironolactone (ALDACTONE) 25 MG tablet TAKE ONE TABLET BY MOUTH ONCE DAILY 08/06/15  Yes Veryl Speak, FNP  tamsulosin (FLOMAX) 0.4 MG CAPS capsule Take 1 capsule (0.4 mg total) by mouth daily after supper. 10/29/15  Yes Starleen Arms, MD  valproic acid (DEPAKENE) 250 MG capsule TAKE ONE CAPSULE BY MOUTH TWICE DAILY 01/22/15  Yes Veryl Speak, FNP  antiseptic oral rinse (CPC / CETYLPYRIDINIUM CHLORIDE 0.05%) 0.05 % LIQD solution 7 mLs by Mouth Rinse route 2 (two) times daily. Patient  not taking: Reported on 12/31/2015 10/29/15   Starleen Arms, MD   Allergies  Allergen Reactions  . Meperidine Hcl Other (See Comments)    unknown   Review of Systems  Respiratory: Positive for shortness of breath.     Physical Exam  Constitutional: He appears lethargic. He appears cachectic. He appears ill.  Cardiovascular: Bradycardia present.   Pulmonary/Chest: He has decreased breath sounds.  Neurological: He appears lethargic.  Skin: Skin is warm and dry.    Vital Signs: BP 87/63 mmHg  Pulse 58  Temp(Src) 98.2 F (36.8 C) (Axillary)  Resp 14  Ht 5\' 5"  (1.651 m)  Wt 56.427 kg (124 lb 6.4 oz)  BMI 20.70 kg/m2  SpO2 93% Pain Assessment: 0-10   Pain Score: Asleep   SpO2: SpO2: 93 % O2 Device:SpO2: 93 % O2 Flow Rate: .O2 Flow Rate (L/min): 14 L/min  IO: Intake/output summary:  Intake/Output Summary (Last 24 hours) at 01/02/16 1104 Last data filed at 01/02/16 0659  Gross per 24 hour  Intake   1300 ml  Output      0 ml  Net   1300 ml    LBM:   Baseline Weight: Weight: 45.36 kg (100 lb) Most recent weight: Weight: 56.427 kg (124 lb 6.4 oz)     Palliative Assessment/Data:     Time In: 1030 Time Out: 1145 Time Total:  45 min Greater than 50%  of this time was spent counseling and coordinating care related to the above assessment and plan.  Signed by: Lorinda Creed, NP  Please contact Palliative Medicine Team phone at [phone] for questions and concerns.

## 2016-01-02 NOTE — Progress Notes (Addendum)
Patient ID: [name], male   DOB: 1946/01/10, 70 y.o.   MRN: [id number]    PROGRESS NOTE    Humza Tallerico Kreft  DZH:[id number] DOB: 20-Dec-1945 DOA: 12/31/2015  PCP: Gildardo Cranker, DO   Outpatient Specialists:   Brief Narrative:  70 y.o. 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 Presented on 4/24 for evaluation of progressive dyspnea and coffee -ground emesis.   Assessment/Plan: Acute hypoxic respiratory failure/Sepsis Unspecified Organism - likely due to possible aspiration pneumonia, but can not r/u HCAP - met criteria for sepsis, with leukocytosis, tachycardia, tachypnea. Lactate is 4.38-->3.9 - remains hypotensive  - currently on Zosyn day #3 - Mucinex for cough  - Urine legionella and S. pneumococcal antigen pending  - pt poorly responding to current measures, PCT consulted for [place] discussions   Acute GI Bleed (Coffee-ground emesis) - had this issue in previous admission. GI consulted. patient he did not wish for any further workup regarding GI bleed. - Hg remains overall stable   Ischemic bowel disease - CT angio with embolic/thrombotic occlusion of the distal SMA and jejunal branches, small bowel ischemic wall thickening. - Pt not interested in any invasive procedure - pt counseled if restart any type anticoagulation would most likely bleed. - Pt not a surgical candidate.  - PCT consulted   Acute kidney injury with hyperkalemia - resolved   Chronic combined systolic and diastolic CHF/ischemic cardiomyopathy/CAD/ICD - 2-D echo 2014 showed LVEF 20-25% with diffuse hypokinesis. No leg edema or JVD on admisison.  - CHF is compensated - stop antihypertensives due to hypotension   LV thrombus - not AC  candidate due to GIB  Essential hypertension - now hypotensive - stop all antihypertensives for now  Hyperlipidemia - no indication for this to ensure comfort per pt desire   CVA - Plavix on hold due to risk of bleeding   Bipolar disorder:  - stable at this time  Protein calorie malnutrition, severe and adult failure to thrive - nectar thick diet  Diarrhea - hold all laxatives  DVT prophylaxis: SCD's if pt agrees, otherwise ensuring comfort as pt not candidate for [facility] Code Status: DNR Family Communication: No family at bedside  Disposition Plan: ? Residential hospice   Consultants:   PCT  Procedures:   None  Antimicrobials:  Zosyn 4/24 -->  Subjective: Tired this AM, denies dyspnea, pain controlled  Objective: Filed Vitals:   01/02/16 0747 01/02/16 0900 01/02/16 1000 01/02/16 1200  BP:  96/53 87/63   Pulse:  58    Temp: 98.2 F (36.8 C)   98.1 F (36.7 C)  TempSrc: Axillary   Axillary  Resp:  14    Height:      Weight:      SpO2:  91% 93%     Intake/Output Summary (Last 24 hours) at 01/02/16 1255 Last data filed at 01/02/16 0659  Gross per 24 hour  Intake   1250 ml  Output      0 ml  Net   1250 ml   Filed Weights   12/31/15 2125 01/02/16 0335  Weight: 45.36 kg (100 lb) 56.427 kg (124 lb 6.4 oz)    Examination:  General exam: Appears comfortable Respiratory system: diminished breath sounds with rales at bases  Deferred rest of exam to ensure comfort per pt  request   Data Reviewed: I have personally reviewed following labs and imaging studies  CBC:  Recent Labs Lab 12/31/15 2136  01/01/16 1115 01/01/16 1340 01/01/16 2018 01/02/16 0215 01/02/16 0808  WBC 23.0*  < > 20.0* 21.1* 25.0* 24.4* 23.0*  NEUTROABS 20.0*  --  17.2*  --   --   --   --   HGB 12.4*  < > 10.2* 10.2* 10.3* 9.6* 9.7*  HCT 40.4  < > 33.2* 33.0* 33.9* 31.6* 31.0*  MCV 92.4  < > 91.7 91.2 92.6 91.1 92.0  PLT 333  < > 298 289 286 262 249  < > = values in this  interval not displayed. Basic Metabolic Panel:  Recent Labs Lab 12/31/15 2136 12/31/15 2145 01/01/16 1115  NA 137 138 136  K 5.6* 5.5* 4.0  CL 103 103 104  CO2 21*  --  22  GLUCOSE 146* 149* 118*  BUN 38* 49* 32*  CREATININE 1.26* 1.20 1.00  CALCIUM 8.7*  --  8.2*  MG  --   --  1.7   Liver Function Tests:  Recent Labs Lab 12/31/15 2136 01/01/16 1115  AST 36 23  ALT 12* 9*  ALKPHOS 57 43  BILITOT 0.7 0.4  PROT 9.0* 7.7  ALBUMIN 2.3* 1.9*    Recent Labs Lab 01/01/16 0113  LIPASE 16   Coagulation Profile:  Recent Labs Lab 12/31/15 2136  INR 1.21   CBG:  Recent Labs Lab 12/31/15 2132 01/01/16 0844  GLUCAP 146* 109*   Urine analysis:    Component Value Date/Time   COLORURINE YELLOW 01/01/2016 0739   APPEARANCEUR CLEAR 01/01/2016 0739   LABSPEC 1.015 01/01/2016 0739   PHURINE 5.5 01/01/2016 0739   GLUCOSEU 100* 01/01/2016 0739   HGBUR NEGATIVE 01/01/2016 0739   BILIRUBINUR NEGATIVE 01/01/2016 0739   KETONESUR NEGATIVE 01/01/2016 0739   PROTEINUR NEGATIVE 01/01/2016 0739   UROBILINOGEN 1.0 05/21/2014 1844   NITRITE NEGATIVE 01/01/2016 0739   LEUKOCYTESUR NEGATIVE 01/01/2016 0739   Sepsis Labs: '@LABRCNTIP'$ (procalcitonin:4,lacticidven:4)  ) Recent Results (from the past 240 hour(s))  Blood culture (routine x 2)     Status: None (Preliminary result)   Collection Time: 12/31/15 10:34 PM  Result Value Ref Range Status   Specimen Description BLOOD RIGHT ANTECUBITAL  Final   Special Requests BOTTLES DRAWN AEROBIC AND ANAEROBIC 10CC  Final   Culture NO GROWTH < 12 HOURS  Final   Report Status PENDING  Incomplete  Blood culture (routine x 2)     Status: None (Preliminary result)   Collection Time: 12/31/15 10:34 PM  Result Value Ref Range Status   Specimen Description BLOOD RIGHT FOREARM  Final   Special Requests BOTTLES DRAWN AEROBIC AND ANAEROBIC 10CC  Final   Culture  Setup Time   Final    GRAM POSITIVE COCCI IN CLUSTERS AEROBIC BOTTLE  ONLY CRITICAL RESULT CALLED TO, READ BACK BY AND VERIFIED WITH: C RALPH RN 1927 01/01/16 A BROWNING    Culture TOO YOUNG TO READ  Final   Report Status PENDING  Incomplete  MRSA PCR Screening     Status: Abnormal   Collection Time: 01/01/16  9:32 AM  Result Value Ref Range Status   MRSA by PCR POSITIVE (A) NEGATIVE Final    Radiology Studies: Dg Chest Portable 1 View 12/31/2015 Minimal right basilar atelectasis noted.  Lungs otherwise clear.   Ct Angio Abd/pel W/ And/or W/o 01/01/2016  Acute appearing embolic/thrombotic occlusion of the distal SMA and jejunal branches. Associated small  bowel ischemic wall thickening. 2. Right inguinal hernia containing thickened small bowel. 3. Bibasilar pneumonia or aspiration. 4. Congenital non rotation of bowel.   Scheduled Meds: . antiseptic oral rinse  7 mL Mouth Rinse BID  . Chlorhexidine Gluconate Cloth  6 each Topical Q0600  . erythromycin  1 application Right Eye QID  . feeding supplement  1 Container Oral BID BM  . mineral oil-hydrophilic petrolatum  1 application Topical QHS  . mupirocin ointment  1 application Nasal BID  . piperacillin-tazobactam (ZOSYN)  IV  3.375 g Intravenous Q8H  . valproic acid  250 mg Oral BID   Continuous Infusions: . sodium chloride 50 mL/hr at 01/01/16 1725     LOS: 2 days   Time spent: 20 minutes   Faye Ramsay, MD [facility] Pager [phone]  If 7PM-7AM, please contact night-coverage [email or web address] Password [facility] 01/02/2016, 12:55 PM

## 2016-01-03 DIAGNOSIS — I2510 Atherosclerotic heart disease of native coronary artery without angina pectoris: Secondary | ICD-10-CM

## 2016-01-03 DIAGNOSIS — Z9861 Coronary angioplasty status: Secondary | ICD-10-CM

## 2016-01-03 DIAGNOSIS — I5042 Chronic combined systolic (congestive) and diastolic (congestive) heart failure: Secondary | ICD-10-CM

## 2016-01-03 LAB — CULTURE, BLOOD (ROUTINE X 2)

## 2016-01-03 MED ORDER — OXYCODONE-ACETAMINOPHEN 5-325 MG PO TABS: 1.0000 | ORAL_TABLET | ORAL | Status: AC | PRN

## 2016-01-03 MED ORDER — BOOST / RESOURCE BREEZE PO LIQD: 1.0000 | Freq: Two times a day (BID) | ORAL | Status: AC

## 2016-01-03 MED ORDER — LEVALBUTEROL HCL 1.25 MG/0.5ML IN NEBU: 1.2500 mg | INHALATION_SOLUTION | Freq: Four times a day (QID) | RESPIRATORY_TRACT | Status: AC | PRN

## 2016-01-03 MED ORDER — LEVOFLOXACIN 500 MG PO TABS: 500.0000 mg | ORAL_TABLET | Freq: Every day | ORAL | Status: AC

## 2016-01-03 NOTE — Discharge Summary (Signed)
Physician Discharge Summary  [name]:[id number] DOB: 1946-08-31 DOA: 12/31/2015  PCP: Gildardo Cranker, DO  Admit date: 12/31/2015 Discharge date: 01/03/2016  Recommendations for Outpatient Follow-up:  1. Pt will be discharged to residential hospice 2. Complete Levaquin for 5 more days   Discharge Diagnoses:  Principal Problem:   Acute respiratory failure (HCC) Active Problems:   HLD (hyperlipidemia)   Bipolar disorder ([place])  Discharge Condition: Stable  Diet recommendation: Comfort feed as tolerated   History of present illness:   Brief Narrative:  70 y.o. 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 Presented on 4/24 for evaluation of progressive dyspnea and coffee -ground emesis.   Assessment/Plan: Acute hypoxic respiratory failure/Sepsis Unspecified Organism - likely due to possible aspiration pneumonia, but can not r/u HCAP - met criteria for sepsis, with leukocytosis, tachycardia, tachypnea. Lactate is 4.38-->3.9 - remains hypotensive  - currently on Zosyn but will transition to oral Levaquin upon discharge to complete therapy  - pt poorly responding to current measures, PCT consulted for [place] discussions, pt confirms DNR - will be discharged to residential hospice   Acute GI Bleed (Coffee-ground emesis) - had this issue in previous admission. GI consulted. patient he did not wish for any further workup regarding GI bleed. - Hg remains overall stable  - no need for further blood work in an effort to ensure comfort   Ischemic bowel disease - CT angio with embolic/thrombotic occlusion of the distal SMA and jejunal branches, small bowel ischemic wall thickening. - Pt not interested in any invasive  procedure - pt counseled if restart any type anticoagulation would most likely bleed. - Pt not a surgical candidate.   Acute kidney injury with hyperkalemia - resolved   Chronic combined systolic and diastolic CHF/ischemic cardiomyopathy/CAD/ICD - 2-D echo 2014 showed LVEF 20-25% with diffuse hypokinesis. No leg edema or JVD on admisison.  - CHF is compensated - stopped antihypertensives due to hypotension   LV thrombus - not AC candidate due to GIB  Essential hypertension - now hypotensive - stopped all antihypertensives for now  Hyperlipidemia - no indication for this to ensure comfort per pt desire   CVA - Plavix stopped due to risk of bleeding   Bipolar disorder:  - stable at this time  Protein calorie malnutrition, severe and adult failure to thrive - nectar thick diet  Diarrhea - stopped laxatives   Code Status: DNR Family Communication: No family at bedside  Disposition Plan: Residential hospice   Consultants:  PCT  Studies: Dg Chest Portable 1 View 12/31/2015  Minimal right basilar atelectasis noted.  Lungs otherwise clear.   Ct Angio Abd/pel W/ And/or W/o 01/01/2016   Acute appearing embolic/thrombotic occlusion of the distal SMA and jejunal branches. Associated small bowel ischemic wall thickening. 2. Right inguinal hernia containing thickened small bowel. 3. Bibasilar pneumonia or aspiration. 4. Congenital non rotation of bowel.   Discharge Exam: Filed Vitals:   01/02/16 2300 01/03/16 0700  BP: 98/71 94/60  Pulse: 84 38  Temp:    Resp: 18 20   Filed Vitals:   01/02/16 2000 01/02/16 2100 01/02/16 2300 01/03/16 0700  BP: '79/47 95/63 98/71 '$ 94/60  Pulse: 81  84 38  Temp:  98.3 F (36.8 C)    TempSrc:      Resp: 18 13 18  20  Height:      Weight:      SpO2: 98% 99% 98% 98%    General: Pt is alert, deferred exam per pt request in an effort to ensure comfort   Discharge Instructions  Discharge Instructions    Diet - low sodium heart  healthy    Complete by:  As directed      Increase activity slowly    Complete by:  As directed             Medication List    STOP taking these medications        atorvastatin 40 MG tablet  Commonly known as:  LIPITOR     bisacodyl 10 MG suppository  Commonly known as:  DULCOLAX     carvedilol 25 MG tablet  Commonly known as:  COREG     famotidine 20 MG tablet  Commonly known as:  PEPCID     FLUoxetine 20 MG capsule  Commonly known as:  PROZAC     furosemide 20 MG tablet  Commonly known as:  LASIX     magnesium hydroxide 400 MG/5ML suspension  Commonly known as:  MILK OF MAGNESIA     pantoprazole 40 MG tablet  Commonly known as:  PROTONIX     polyethylene glycol packet  Commonly known as:  MIRALAX / GLYCOLAX     RA SALINE ENEMA 19-7 GM/118ML Enem     saccharomyces boulardii 250 MG capsule  Commonly known as:  FLORASTOR     spironolactone 25 MG tablet  Commonly known as:  ALDACTONE     tamsulosin 0.4 MG Caps capsule  Commonly known as:  FLOMAX      TAKE these medications        acetaminophen 325 MG tablet  Commonly known as:  TYLENOL  Take 2 tablets (650 mg total) by mouth every 4 (four) hours as needed for mild pain, moderate pain, fever or headache.     antiseptic oral rinse 0.05 % Liqd solution  Commonly known as:  CPC / CETYLPYRIDINIUM CHLORIDE 0.05%  7 mLs by Mouth Rinse route 2 (two) times daily.     erythromycin ophthalmic ointment  Place 1 application into the right eye 4 (four) times daily.     feeding supplement Liqd  Take 1 Container by mouth 2 (two) times daily between meals.     levalbuterol 1.25 MG/0.5ML nebulizer solution  Commonly known as:  XOPENEX  Take 1.25 mg by nebulization every 6 (six) hours as needed for wheezing or shortness of breath.     levofloxacin 500 MG tablet  Commonly known as:  LEVAQUIN  Take 1 tablet (500 mg total) by mouth daily.     mineral oil-hydrophilic petrolatum ointment  Apply 1 application  topically at bedtime.     ondansetron 4 MG tablet  Commonly known as:  ZOFRAN  Take 4 mg by mouth every 6 (six) hours as needed for vomiting.     oxyCODONE-acetaminophen 5-325 MG tablet  Commonly known as:  ROXICET  Take 1-2 tablets by mouth every 4 (four) hours as needed for severe pain.     valproic acid 250 MG capsule  Commonly known as:  DEPAKENE  TAKE ONE CAPSULE BY MOUTH TWICE DAILY           Follow-up Information    Follow up with Gildardo Cranker, DO.   Specialty:  Internal Medicine   Contact information:   [place] [id number] [phone]  The results of significant diagnostics from this hospitalization (including imaging, microbiology, ancillary and laboratory) are listed below for reference.     Microbiology: Recent Results (from the past 240 hour(s))  Blood culture (routine x 2)     Status: None (Preliminary result)   Collection Time: 12/31/15 10:34 PM  Result Value Ref Range Status   Specimen Description BLOOD RIGHT ANTECUBITAL  Final   Special Requests BOTTLES DRAWN AEROBIC AND ANAEROBIC 10CC  Final   Culture NO GROWTH 2 DAYS  Final   Report Status PENDING  Incomplete  Blood culture (routine x 2)     Status: None (Preliminary result)   Collection Time: 12/31/15 10:34 PM  Result Value Ref Range Status   Specimen Description BLOOD RIGHT FOREARM  Final   Special Requests BOTTLES DRAWN AEROBIC AND ANAEROBIC 10CC  Final   Culture  Setup Time   Final    GRAM POSITIVE COCCI IN CLUSTERS AEROBIC BOTTLE ONLY CRITICAL RESULT CALLED TO, READ BACK BY AND VERIFIED WITH: C RALPH RN 1927 01/01/16 A BROWNING    Culture TOO YOUNG TO READ  Final   Report Status PENDING  Incomplete  Respiratory virus panel     Status: None   Collection Time: 01/01/16  5:35 AM  Result Value Ref Range Status   Respiratory Syncytial Virus A Negative Negative Final   Respiratory Syncytial Virus B Negative Negative Final   Influenza A Negative Negative Final    Influenza B Negative Negative Final   Parainfluenza 1 Negative Negative Final   Parainfluenza 2 Negative Negative Final   Parainfluenza 3 Negative Negative Final   Metapneumovirus Negative Negative Final   Rhinovirus Negative Negative Final   Adenovirus Negative Negative Final    Comment: (NOTE) Performed At: [facility] [address] [place], [place] [id number] Mila Homer MD GA:[id number]   Urine culture     Status: None   Collection Time: 01/01/16  7:39 AM  Result Value Ref Range Status   Specimen Description URINE, RANDOM  Final   Special Requests NONE  Final   Culture NO GROWTH 1 DAY  Final   Report Status 01/02/2016 FINAL  Final  MRSA PCR Screening     Status: Abnormal   Collection Time: 01/01/16  9:32 AM  Result Value Ref Range Status   MRSA by PCR POSITIVE (A) NEGATIVE Final    Comment:        The GeneXpert MRSA Assay (FDA approved for NASAL specimens only), is one component of a comprehensive MRSA colonization surveillance program. It is not intended to diagnose MRSA infection nor to guide or monitor treatment for MRSA infections. RESULT CALLED TO, READ BACK BY AND VERIFIED WITH: J SMITH,RN AT 1139 01/01/16 BY L BENFIELD      Labs: Basic Metabolic Panel:  Recent Labs Lab 12/31/15 2136 12/31/15 2145 01/01/16 1115  NA 137 138 136  K 5.6* 5.5* 4.0  CL 103 103 104  CO2 21*  --  22  GLUCOSE 146* 149* 118*  BUN 38* 49* 32*  CREATININE 1.26* 1.20 1.00  CALCIUM 8.7*  --  8.2*  MG  --   --  1.7   Liver Function Tests:  Recent Labs Lab 12/31/15 2136 01/01/16 1115  AST 36 23  ALT 12* 9*  ALKPHOS 57 43  BILITOT 0.7 0.4  PROT 9.0* 7.7  ALBUMIN 2.3* 1.9*    Recent Labs Lab 01/01/16 0113  LIPASE 16   CBC:  Recent Labs Lab 12/31/15 2136  01/01/16 1115  01/01/16 1340 01/01/16 2018 01/02/16 0215 01/02/16 0808  WBC 23.0*  < > 20.0* 21.1* 25.0* 24.4* 23.0*  NEUTROABS 20.0*  --  17.2*  --   --   --   --   HGB 12.4*  < > 10.2*  10.2* 10.3* 9.6* 9.7*  HCT 40.4  < > 33.2* 33.0* 33.9* 31.6* 31.0*  MCV 92.4  < > 91.7 91.2 92.6 91.1 92.0  PLT 333  < > 298 289 286 262 249  < > = values in this interval not displayed.  BNP: BNP (last 3 results)  Recent Labs  10/22/15 1622 01/01/16 0205  BNP 225.9* 316.5*   CBG:  Recent Labs Lab 12/31/15 2132 01/01/16 0844  GLUCAP 146* 109*   SIGNED: Time coordinating discharge:  30 minutes  Faye Ramsay, MD  [facility] 01/03/2016, 8:22 AM Pager [phone]  If 7PM-7AM, please contact night-coverage [email or web address] Password [email or web address]

## 2016-01-03 NOTE — Consult Note (Signed)
[facility] [employer] is available today for [name]. CSW Osborne Casco is aware. Son [name] completed paper work yesterday in anticipation of transfer today. Dr. Kern Reap to assume care at [facility] per family choice.   Thank you to RNs for assistance with this referral.   RN please call report to [phone].  Please fax discharge summary to [phone].  Please arrange transport for [name] to arrive at [facility] as early as possible.  Thank you.  Forrestine Him LCSW [phone]

## 2016-01-03 NOTE — Discharge Instructions (Signed)
Sepsis, Adult Sepsis is a serious infection of your blood or tissues that affects your whole body. The infection that causes sepsis may be bacterial, viral, fungal, or parasitic. Sepsis may be life threatening. Sepsis can cause your blood pressure to drop. This may result in shock. Shock causes your central nervous system and your organs to stop working correctly.  RISK FACTORS Sepsis can happen in anyone, but it is more likely to happen in people who have weakened immune systems. SIGNS AND SYMPTOMS  Symptoms of sepsis can include:  Fever or low body temperature (hypothermia).  Rapid breathing (hyperventilation).  Chills.  Rapid heartbeat (tachycardia).  Confusion or light-headedness.  Trouble breathing.  Urinating much less than usual.  Cool, clammy skin or red, flushed skin.  Other problems with the heart, kidneys, or brain. DIAGNOSIS  Your health care provider will likely do tests to look for an infection, to see if the infection has spread to your blood, and to see how serious your condition is. Tests can include:  Blood tests, including cultures of your blood.  Cultures of other fluids from your body, such as:  Urine.  Pus from wounds.  Mucus coughed up from your lungs.  Urine tests other than cultures.  X-ray exams or other imaging tests. TREATMENT  Treatment will begin with elimination of the source of infection. If your sepsis is likely caused by a bacterial or fungal infection, you will be given antibiotic or antifungal medicines. You may also receive:  Oxygen.  Fluids through an IV tube.  Medicines to increase your blood pressure.  A machine to clean your blood (dialysis) if your kidneys fail.  A machine to help you breathe if your lungs fail. SEEK IMMEDIATE MEDICAL CARE IF: You get an infection or develop any of the signs and symptoms of sepsis after surgery or a hospitalization.   This information is not intended to replace advice given to you by  your health care provider. Make sure you discuss any questions you have with your health care provider.   Document Released: 05/24/2003 Document Revised: 01/09/2015 Document Reviewed: 05/02/2013 Elsevier Interactive Patient Education 2016 Elsevier Inc.  

## 2016-01-03 NOTE — Progress Notes (Signed)
Patient will DC to: [facility] Anticipated DC date: 01/03/16 Family notified: Son Transport by: Sharin Mons   Per MD patient ready for DC to Hospice. RN, patient, patient's family, and facility notified of DC. RN given number for report. DC packet on chart. Ambulance transport requested for patient.   CSW signing off.  Cristobal Goldmann, [place] Clinical Social Worker [phone]

## 2016-01-04 ENCOUNTER — Telehealth: Payer: Self-pay | Admitting: Internal Medicine

## 2016-01-05 LAB — CULTURE, BLOOD (ROUTINE X 2): CULTURE: NO GROWTH

## 2016-01-07 NOTE — Telephone Encounter (Signed)
Possible re-admission to facility  - This is a patient you were seeing at **[facility]** - [facility] fu is needed IF patient was re-admitted to facility upon discharge. Hospital discharge **01/03/16**

## 2016-01-07 DEATH — deceased

## 2016-01-28 ENCOUNTER — Ambulatory Visit: Payer: Medicare Other | Admitting: Internal Medicine

## 2016-11-01 IMAGING — CT CT CTA ABD/PEL W/CM AND/OR W/O CM
1 of 11 series · 1 of 46 positions shown, 3 images · IV contrast (Iodine)
Comparison: 08/29/2010

CLINICAL DATA: Abdominal pain, generalized.

EXAM:
CTA ABDOMEN AND PELVIS wITHOUT AND WITH CONTRAST
TECHNIQUE: Multidetector CT imaging of the abdomen and pelvis was performed
using the standard protocol during bolus administration of
intravenous contrast. Multiplanar reconstructed images and MIPs were
obtained and reviewed to evaluate the vascular anatomy.
CONTRAST:  80 cc Isovue 370 intravenous

[Series 200: 2cm above celiac · axial · 0.50mm/px · z∈[+399,+399]mm · 1 of 1 slices shown, 3 images]
[im 1/1  soft-tissue]
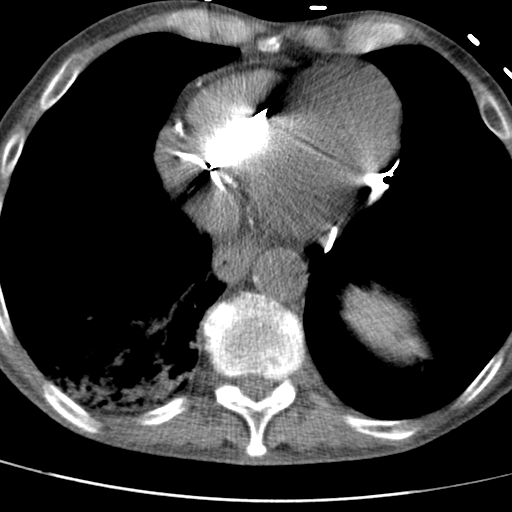
[im 1/1  lung]
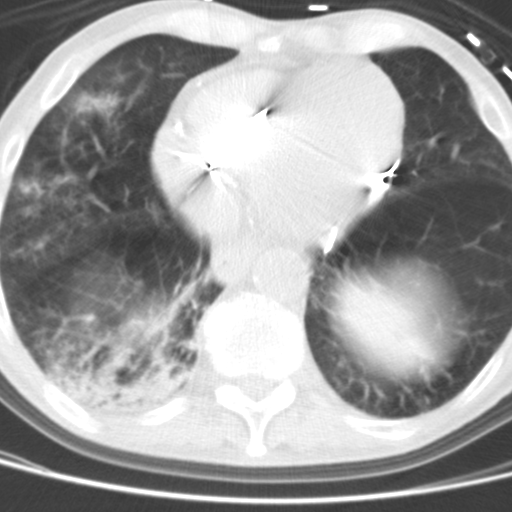
[im 1/1  bone]
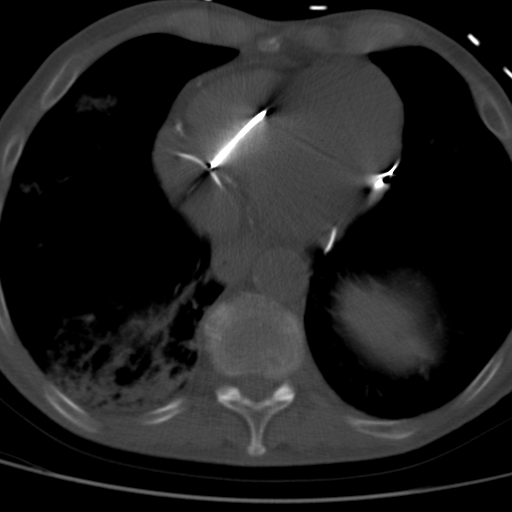

[1 of 46 positions shown; findings below may reference images not displayed]

FINDINGS: Lower chest and abdominal wall: Right middle and right more than
left lower lobe airspace disease consistent with pneumonia or
aspiration.

No embolic source seen in the heart.

Right inguinal hernia containing congested/thickened small bowel.

Biventricular ICD/ pacer.

Hepatobiliary: No focal liver abnormality.No evidence of biliary
obstruction or stone.

Pancreas: Chronically dilated main pancreatic duct at 5 mm without
visible stone or mass.

Spleen: Unremarkable.

Adrenals/Urinary Tract: Negative adrenals. Bilateral renal cortical
scarring. No hydronephrosis or stone. Unremarkable bladder.

Reproductive:No acute findings.

Stomach/Bowel: Pelvic small bowel loops are circumferentially
thickened with prominent mucosal enhancement and submucosal edema.
This also includes bowel loops in the right inguinal hernia sac. No
perforation or nonenhancing segment of bowel. Prominent rectal stool
volume. Congenital non rotation of bowel with colon clustered on the
left and small bowel on the right. Probable appendectomy.

Vascular/Lymphatic: There is abrupt occlusion at the level of the
distal SMA, branching into jejunal branches. Reconstitution at the
ileocolic level. This is likely embolic. No other major branch
occlusion is seen. Atherosclerosis. No mass or adenopathy.

Peritoneal: No ascites or pneumoperitoneum.

Musculoskeletal: No acute abnormalities.

Remote L3 compression fracture.

Critical Value/emergent results were called by telephone at the time
of interpretation on 01/01/2016 at [DATE] to Dr. Howain, who
verbally acknowledged these results.

Review of the MIP images confirms the above findings.
IMPRESSION: 1. Acute appearing embolic/thrombotic occlusion of the distal SMA
and jejunal branches. Associated small bowel ischemic wall
thickening.
2. Right inguinal hernia containing thickened small bowel.
3. Bibasilar pneumonia or aspiration.
4. Congenital non rotation of bowel.
